# Patient Record
Sex: Male | Born: 1960 | Race: White | Hispanic: No | Marital: Married | Smoking: Former smoker
Health system: Southern US, Community
[De-identification: ages and names within clinical notes are randomized; demographics above are authoritative.]

## PROBLEM LIST (undated history)

## (undated) DIAGNOSIS — M1990 Unspecified osteoarthritis, unspecified site: Secondary | ICD-10-CM

## (undated) DIAGNOSIS — E119 Type 2 diabetes mellitus without complications: Secondary | ICD-10-CM

## (undated) DIAGNOSIS — F32A Depression, unspecified: Secondary | ICD-10-CM

## (undated) DIAGNOSIS — F329 Major depressive disorder, single episode, unspecified: Secondary | ICD-10-CM

## (undated) DIAGNOSIS — Z9889 Other specified postprocedural states: Secondary | ICD-10-CM

## (undated) DIAGNOSIS — R0600 Dyspnea, unspecified: Secondary | ICD-10-CM

## (undated) DIAGNOSIS — R112 Nausea with vomiting, unspecified: Secondary | ICD-10-CM

## (undated) DIAGNOSIS — R351 Nocturia: Secondary | ICD-10-CM

## (undated) DIAGNOSIS — I10 Essential (primary) hypertension: Secondary | ICD-10-CM

## (undated) HISTORY — PX: KNEE SURGERY: SHX244

## (undated) HISTORY — DX: Type 2 diabetes mellitus without complications: E11.9

## (undated) HISTORY — PX: WRIST SURGERY: SHX841

## (undated) HISTORY — PX: COLONOSCOPY: SHX174

---

## 1999-07-18 ENCOUNTER — Emergency Department (HOSPITAL_COMMUNITY): Admission: EM | Admit: 1999-07-18 | Discharge: 1999-07-18 | Payer: Self-pay | Admitting: Emergency Medicine

## 1999-07-18 ENCOUNTER — Encounter: Payer: Self-pay | Admitting: Emergency Medicine

## 2000-03-26 ENCOUNTER — Other Ambulatory Visit: Admission: RE | Admit: 2000-03-26 | Discharge: 2000-03-26 | Payer: Self-pay | Admitting: Urology

## 2000-03-26 ENCOUNTER — Encounter (INDEPENDENT_AMBULATORY_CARE_PROVIDER_SITE_OTHER): Payer: Self-pay | Admitting: Specialist

## 2001-12-02 ENCOUNTER — Encounter: Payer: Self-pay | Admitting: Orthopedic Surgery

## 2001-12-02 ENCOUNTER — Encounter: Admission: RE | Admit: 2001-12-02 | Discharge: 2001-12-02 | Payer: Self-pay | Admitting: Orthopedic Surgery

## 2001-12-03 ENCOUNTER — Ambulatory Visit (HOSPITAL_BASED_OUTPATIENT_CLINIC_OR_DEPARTMENT_OTHER): Admission: RE | Admit: 2001-12-03 | Discharge: 2001-12-04 | Payer: Self-pay | Admitting: Orthopedic Surgery

## 2003-03-10 ENCOUNTER — Ambulatory Visit (HOSPITAL_COMMUNITY): Admission: RE | Admit: 2003-03-10 | Discharge: 2003-03-10 | Payer: Self-pay | Admitting: Orthopedic Surgery

## 2003-03-10 ENCOUNTER — Ambulatory Visit (HOSPITAL_BASED_OUTPATIENT_CLINIC_OR_DEPARTMENT_OTHER): Admission: RE | Admit: 2003-03-10 | Discharge: 2003-03-10 | Payer: Self-pay | Admitting: Orthopedic Surgery

## 2003-08-28 ENCOUNTER — Inpatient Hospital Stay (HOSPITAL_COMMUNITY): Admission: AD | Admit: 2003-08-28 | Discharge: 2003-09-02 | Payer: Self-pay | Admitting: Psychiatry

## 2004-06-12 ENCOUNTER — Ambulatory Visit: Payer: Self-pay | Admitting: Psychiatry

## 2004-06-12 ENCOUNTER — Inpatient Hospital Stay (HOSPITAL_COMMUNITY): Admission: RE | Admit: 2004-06-12 | Discharge: 2004-06-16 | Payer: Self-pay | Admitting: Psychiatry

## 2004-06-12 ENCOUNTER — Ambulatory Visit: Payer: Self-pay | Admitting: Licensed Clinical Social Worker

## 2004-06-20 ENCOUNTER — Ambulatory Visit: Payer: Self-pay | Admitting: Licensed Clinical Social Worker

## 2004-06-27 ENCOUNTER — Ambulatory Visit: Payer: Self-pay | Admitting: Licensed Clinical Social Worker

## 2004-07-08 ENCOUNTER — Ambulatory Visit: Payer: Self-pay | Admitting: Licensed Clinical Social Worker

## 2004-07-16 ENCOUNTER — Ambulatory Visit: Payer: Self-pay | Admitting: Licensed Clinical Social Worker

## 2004-07-24 ENCOUNTER — Ambulatory Visit: Payer: Self-pay | Admitting: Licensed Clinical Social Worker

## 2004-08-06 ENCOUNTER — Ambulatory Visit: Payer: Self-pay | Admitting: Licensed Clinical Social Worker

## 2008-11-23 ENCOUNTER — Emergency Department (HOSPITAL_COMMUNITY): Admission: EM | Admit: 2008-11-23 | Discharge: 2008-11-23 | Payer: Self-pay | Admitting: Emergency Medicine

## 2010-09-06 NOTE — Discharge Summary (Signed)
NAMEBRUK, Jonathan Riley NO.:  [id number]   MEDICAL RECORD NO.:  [id number]          PATIENT TYPE:  IPS   LOCATION:  0506                          FACILITY:  [facility]   PHYSICIAN:  Jeanice Lim, M.D. DATE OF BIRTH:  1960-05-23   DATE OF ADMISSION:  06/12/2004  DATE OF DISCHARGE:  06/16/2004                                 DISCHARGE SUMMARY   IDENTIFYING DATA:  This is a 50 year old married Caucasian male voluntarily  admitted presenting with a history of depression  and suicide attempt,  angers easily, threatening wife.  Wife took kids and left.  Patient had felt  suicidal, was going to kill himself with carbon monoxide.  He called wife  and wanted to hear her voice one last time.  The 50-year-old did get on the  phone, and patient decided not to continue.  He realized how angry he gets  and wanted help.   PAST PSYCHIATRIC HISTORY:  This is second [facility]  admission.  He was here 1 year ago for an overdose.  He sees Dr. Marlyne Beards as  an outpatient.   MEDICATIONS:  Wellbutrin XL.  Lithium.  Lipitor.  Xanax.   DRUG ALLERGIES:  No known drug allergies.   PHYSICAL EXAMINATION:  His physical and neurologic exam within normal  limits.   ROUTINE ADMISSION LABS:  Within normal limits.   MENTAL STATUS EXAM:  Alert, middle-aged male, cooperative, clear.  Speech  within normal limits.  Mood guilty.  Thought processes goal-directed.  Cognitively intact. Judgment and insight were impaired with poor impulse  control.  He denied acute suicidal ideation and admitted to problems with  reactivity and anger management and possible mood swings.   ADMISSION DIAGNOSES:   AXIS I:  Bipolar disorder, not otherwise specified.   AXIS II:  Deferred.   AXIS III:  None.   AXIS IV:  Moderate stressors with limited support system.   AXIS V:  35/60.   HOSPITAL COURSE:  Patient was admitted, ordered routine p.r.n. medicine,  underwent further monitoring, was  encouraged to participate in individual,  group and milieu therapy.  He was monitored for safety with every 15 minute  checks following suicide attempt and was stabilized on medications.  Patient  was started on lithium after risk/benefits ratio and alternative treatments  were discussed.  Patient described clear history of mood instability.  Patient felt somewhat groggy and Equetro.  This was decreased, and patient  was discharged in improved condition.  Patient was discharged with no  dangerous ideation.  Mood was more stable.  Affect brighter, more helpful.  Patient had improved judgment, insight and coping skills and appeared to be  motivated to be compliant with the aftercare planning.  He was given  medication education again regarding issues with lithium, importance of  monitoring, dangers of overdose.   DISCHARGE MEDICATIONS:  After medication education he was discharged on:  1.  Wellbutrin XL 150 mg q.a.m.  2.  Seroquel 25 mg 2 at 8:00 p.m.  3.  Equetro 100 mg 1 at 9:00 a.m. and 3 at 8:00 p.m.  4.  Xanax 0.25 mg 1 at 7:00 a.m., one at 6:00 p.m. and 2 at 8:00 p.m.  5.  Lithium 450 mg 1 at 9:00 a.m. and 1-1/2 at 6:00 p.m.   FOLLOW UP:  Patient is to follow up with Ladona Horns at [facility] for anger management, and Dr. Beverly Milch and Judithe Modest with  appointments already scheduled.  Patient was discharged in improved  condition.   DISCHARGE DIAGNOSES:   AXIS I:  Bipolar disorder, not otherwise specified.   AXIS II:  Deferred.   AXIS III:  None.   AXIS IV:  Moderate stressors with limited support system.   AXIS V:  Global assessment of function on discharge 55-60.      JEM/MEDQ  D:  07/21/2004  T:  07/21/2004  Job:  [id number]

## 2010-09-06 NOTE — Op Note (Signed)
NAME:  Jonathan Riley, Jonathan Riley                         ACCOUNT NO.:  [id number]   MEDICAL RECORD NO.:  [id number]                   PATIENT TYPE:  AMB   LOCATION:  DSC                                  FACILITY:  [facility]   PHYSICIAN:  Harvie Junior, M.D.                DATE OF BIRTH:  [birth year]   DATE OF PROCEDURE:  12/03/2001  DATE OF DISCHARGE:  12/04/2001                                 OPERATIVE REPORT   PREOPERATIVE DIAGNOSES:  Anterior cruciate ligament tear with medial  meniscal tear.   POSTOPERATIVE DIAGNOSES:  Anterior cruciate ligament tear with medial  meniscal tear with a bucket handle type medial meniscal tear.   OPERATION PERFORMED:  Left knee arthroscopy with anterior cruciate ligament  reconstruction and partial medial meniscectomy.   SURGEON:  Harvie Junior, M.D.   ASSISTANT:  Currie Paris. Thedore Mins.   ANESTHESIA:  General.   INDICATIONS FOR PROCEDURE:  Jonathan Riley is a 50 year old male with a long  history of having had his knee giving out on multiple occasions.  He  ultimately was evaluated and felt to have an anterior cruciate ligament  tear.  MRI was obtained which showed that he had a medial meniscal tear as  well as anterior cruciate ligament tear.  We talked about treatment options  and ultimately felt that he wanted to get both fixed.  He was brought to the  operating room for this procedure.   DESCRIPTION OF PROCEDURE:  The patient was brought to the operating room  where adequate anesthesia was obtained with general anesthetic.  The patient  was placed supine on the operating table.  The leg was prepped and draped in  the usual sterile fashion.  Following this, routine arthroscopic examination  of the knee revealed that there was an obvious large bucket handle medial  meniscal tear that was not repairable.  This was debrided from the front and  back and the meniscal fragment was removed.  The remaining meniscal rim was  contoured down with a suction  shaver.  The medial femoral condyle had some  grade 1 change but not significant.  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  Attention was  turned to the lateral compartment which was without significant abnormality.  The patellofemoral compartment was normal.  At this time attention was  turned toward the notch where a notch plasty was performed with a  combination of motorized bur and suction shaver.  Once an adequate  notchplasty was obtained, the leg was exsanguinated.  Blood pressure  tourniquet was inflated to 350 mmHg.  Following this, a small incision was  made on the medial aspect of the leg and the instruments for ACL  reconstruction were then brought in.  The guide was set at 60 degrees and  the tibial guide was used and a guidewire was put  into the central portion  of the knee 7mm anterior to the posterior cruciate ligament insertion.  Following this, this was overreamed with a 10 mm reamer.  The over-the-top  guide was then used and a pilot hole of 20 mm was made on the femoral side 6  mm from the back and 9 mm bone plug proximally.  Following this, a pilot  hole was made and the drill was brought back to make sure that there was a  good back wall.  This was drilled to a level of 25 and following this, all  bony fragments were removed from the knee.  The graft was then pulled to the  knee.  The graft had been fashioned on the back table by Jonathan Riley  graftologist to a 9 mm bone plug proximally, 10 distally with a 13 mm  collagen thickness centrally.  At this point the graft was pulled and the  knee locked proximally and distally with titanium interference fit screws.  Excellent  fixation was achieved at this point.  The knee was then copiously  irrigated and suctioned dry.  The leg was checked with end point Lachman  negative, pivot shift negative, both positive prior to the surgery.  At this  point the wounds were  closed in layers, sterile compressive dressing was  applied.  The patient was placed into a compressive dressing as well as knee  immobilizer and taken to the recovery room where he was noted to be in  satisfactory condition.  The estimated blood loss for this procedure was  none.  Harvie Junior, M.D.    Ranae Plumber  D:  01/20/2002  T:  01/20/2002  Job:  [id number]

## 2010-09-06 NOTE — Discharge Summary (Signed)
NAME:  Jonathan Riley, Jonathan Riley                         ACCOUNT NO.:  [id number]   MEDICAL RECORD NO.:  [id number]                   PATIENT TYPE:  IPS   LOCATION:  0507                                 [facility]ILITY[facility]   PHYSICIAN:  Syed T. Arfeen, M.D.                DATE OF BIRTH:  March 08, 1961   DATE OF ADMISSION:  08/28/2003  DATE OF DISCHARGE:  09/02/2003                                 DISCHARGE SUMMARY   IDENTIFYING INFORMATION:  The patient is a 50 year old white married man who  came for an involuntary admission.  The patient was referred and then  presented in the ER after taking an overdose of 25 mg of Seroquel 25  tablets.  The patient stated that he was having nightmares and obsessive  sexual thoughts and he was feeling guilty for his thoughts and afraid that  he was going act on those sexual thoughts.  He appeared fearful that the  marriage will be ended if he acted on those sexual thoughts.  He felt  helpless and felt rejected by his wife.  The patient was experiencing those  episodes for three to four weeks.  For more details, please see admission  note.   PAST PSYCHIATRIC HISTORY:  This was the patient's first psychiatric  hospitalization at [facility].  However, he had a history of  prior suicidal threats.  He was diagnosed sex addict and bipolar  disorder.  For details, please see admission note.   SUBSTANCE ABUSE HISTORY:  The patient denied.   PAST MEDICAL HISTORY:  Medical problems: The patient denied; however, he had  a history of right wrist surgery.   MEDICATIONS:  1. Cymbalta 60 mg.  2. Xanax 1 mg frequency not known.  3. Lithium 450 mg, half in the a.m. and half h.s.  4. Wellbutrin dose known.   ALLERGIES:  None.   PHYSICAL EXAMINATION:  GENERAL:  Positive physical findings was done in the  ER.  No significant findings.   MENTAL STATUS EXAM:  Fairly alert with labile affect.  Mood: Labile.  Speech: Normal.  No flight of ideas, no looseness of  associations.  Thought  processes: Logical, having vague suicidal thoughts, a lot of guilt and  hopelessness.  Cognitive function: Intact.  Insight and judgment were fair.   ADMISSION DIAGNOSES:   AXIS I:  Bipolar disorder, rule out hypomania.   AXIS II:  Deferred.   AXIS III:  None.   AXIS IV:  Severe marital stress.   AXIS V:  30   HOSPITAL COURSE:  The patient was admitted and started on standing and  p.r.n. medications.  He was placed on safety checks and underwent further  monitoring.  He was started on Risperdal 0.25 mg and lithium 300 mg p.o.  b.i.d. and Wellbutrin 300 mg in the a.m.  Doses were titrated according to  therapeutic response.  He was also started on Xanax  1 mg p.o. t.i.d. and  Cymbalta was held to see the therapeutic response.  The patient's lithium  level was ordered, which came back at 0.39.  Lithium was increased according  to the therapeutic response to 300 mg t.i.d.  Risperdal was also gradually  increased to 0.25 mg b.i.d. and 0.5 mg at h.s.  Besides the above, he was  also started on 25 mg of Luvox every day.  The patient started to show some  improvement with more stable mood.  A family session was scheduled with the  wife, which went very well.  The patient was able to talk about his issues  and was easily redirectable and able to focus on session goals.  The patient  and wife reported that they had no concern for discharge and the patient  stated that he could handle the stress.  Wife reported that the patient was  baseline; however, requested a referral plan after discharge.  He reported  no sexual intrusive thought and felt more stable.  Lithium level was again  drawn on May 14 which came back 0.39.  The patient continued to show more  improvement.  He reported no intrusive sexual thoughts or suicidal thoughts  and stated that he did improve a lot.  His mood was more stable with  increased coping skills.  He was also encouraged to participate in   individual, group, and milieu therapy.  With the above recommendations,  overall condition was much improved.   CONDITION ON DISCHARGE:  Condition on discharge was markedly improved.  Mood: Euthymic.  Affect: Bright.  Thought process: Goal directed.  Thought  content: Negative for dangerous ideation or psychotic symptoms.  He denied  any intrusive sexual thoughts and seemed motivated for compliance with  aftercare plan and followup therapy.   DISCHARGE MEDICATIONS:  1. Wellbutrin 300 mg by mouth every morning.  2. Lithium carbonate 300 mg three times a day; level 0.39 on May 14.  3. Risperdal 0.25 mg twice a day and 0.5 mg at bedtime.  4. Luvox 25 mg every day.   DISCHARGE DIAGNOSES:   AXIS I:  Bipolar disorder.   AXIS II:  Deferred.   AXIS III:  None.   AXIS IV:  Severe with marital stress.   AXIS V:  80   FOLLOW UP:  The patient was recommended to see Dr. Omelia Blackwater on May 26 at 2  p.m. at [facility].  The patient  was also given a followup appointment with Glendell Docker on Thursday, May  19 at 2:45 p.m.  The patient agreed with the discharge instructions.                                              Syed T. Lolly Mustache, M.D.   STA/MEDQ  D:  10/21/2003  T:  10/22/2003  Job:  [id number]

## 2010-09-06 NOTE — Op Note (Signed)
NAME:  Jonathan Riley, Jonathan Riley                         ACCOUNT NO.:  [id number]   MEDICAL RECORD NO.:  [id number]                   PATIENT TYPE:  AMB   LOCATION:  DSC                                  FACILITY:  [facility]   PHYSICIAN:  Harvie Junior, M.D.                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   PROCEDURE:  1. Proximal row corpectomy.  2. Radial styloidectomy.  3. Posterior interosseous nerve neurectomy.   SURGEON:  Harvie Junior, M.D.   ASSISTANT:  Marshia Ly, P.A.   ANESTHESIA:  General.   INDICATIONS FOR PROCEDURE:  The patient is a 50 year old male with a long  history of having significant right wrist pain.  He had tolerated this for  many years and because of inability to continue performing at his job, he  ultimately was evaluated.  His x-rays at that time showed that he had  significant scapholunate advanced collapse with a large scaphoid osteophyte  and spurring on the radial styloid.  We attempted conservative treatment  with anti-inflammatory medication, immobilization, injection therapy. All of  this worked temporarily, but he continued to have pain on a daily basis with  light activity and also sleep.  Because of continued complaints of pain and  inability to pursue activity further, we talked about treatment options and  ultimately felt that proximal row corpectomy was the best treatment.  I had  reviewed his x-rays with Molli Hazard A. Mina Marble, M.D. who agreed a proximal row  corpectomy was his choice in this patient provided that the capitate looked  good and he was brought to the operating room with the idea that we  would do  a proximal row corpectomy if the capitate looked good.  At this point, the  patient was brought to the operating room for this procedure.   DESCRIPTION OF PROCEDURE:  The patient was brought to the operating room and  after adequate anesthesia was obtained with general endotracheal, the  patient was placed on the operating table.  The right arm was then prepped  and draped in the usual sterile fashion.  Following this, attention was  turned toward the back part of the wrist where a linear incision was made  over the fourth extensor compartment.  A third extensor compartment was then  identified and opened and the entire fourth extensor compartment as well as  the remainder of this area was debrided off of the periosteal area of the  distal radius.  The second and first extensor compartments were debrided off  the distal radius radially.  The proximal row was then excised in pieces,  starting with the lunate, doing a triquetrum, and then finally the scaphoid.  At this point, the capitate came nicely into the lunate fossa, actually  seemed to fit very well. The capitate looked without evidence of significant  abnormality.  Attention was then turned toward x-ray imaging showing that  the proximal row had been resected and assessing the radial styloid area for  any abutment.  It did appear at this point that the radial styloid abutted  with the trapezium and at this point a radial styloidectomy of 9 mm of bone  was taken from the radial styloid area with the saw under fluoroscopic  guidance.  Attention was turned toward the ulnar side of the wrist where the  posterior interosseous nerve was identified and a posterior interosseous  nerve neurectomy was undertaken at this point to try to denervate the wrist  from any wrist capsular pain.  At this point, the dorsal retinaculum was  closed laying the extensor compartments back on the wrist.  Lister's  tubercle was debrided and the EPL  was allowed to fall into its previous  course with suturing underneath the EPL to not have it adhered to bone.  At  this point, the wound was copiously irrigated and suctioned dry.  The  closure of the capsule and this layer was done with interrupted 3-0 Ethibond  sutures.  At this point, the skin was closed with 3-0 Vicryl and a 4-0 nylon  running suture.  A sterile compressive dressing was applied as well as  plaster and the patient was taken to the recovery room where she was noted  to be in satisfactory condition.  Estimated blood loss for the procedure was  none.  Of note, fluoroscopy was used throughout the case on the radial  styloidectomy.                                               Harvie Junior, M.D.    Ranae Plumber  D:  03/10/2003  T:  03/11/2003  Job:  [id number]

## 2013-02-21 ENCOUNTER — Encounter: Payer: Self-pay | Admitting: Diagnostic Neuroimaging

## 2013-02-21 ENCOUNTER — Ambulatory Visit (INDEPENDENT_AMBULATORY_CARE_PROVIDER_SITE_OTHER): Payer: BC Managed Care – PPO | Admitting: Diagnostic Neuroimaging

## 2013-02-21 ENCOUNTER — Encounter (INDEPENDENT_AMBULATORY_CARE_PROVIDER_SITE_OTHER): Payer: Self-pay

## 2013-02-21 VITALS — BP 164/105 | HR 73 | Ht 70.0 in | Wt 245.5 lb

## 2013-02-21 DIAGNOSIS — R404 Transient alteration of awareness: Secondary | ICD-10-CM

## 2013-02-21 NOTE — Progress Notes (Addendum)
[facility]  PATIENT: Jonathan Riley DOB: Mar 12, 1961  REFERRING CLINICIAN: Ehinger HISTORY FROM: patient REASON FOR VISIT: new consult   HISTORICAL  CHIEF COMPLAINT:  Chief Complaint  Patient presents with  . Memory Loss    black outs    HISTORY OF PRESENT ILLNESS:   52 year old right-handed male here for evaluation of blackout spells, possible seizures.  June 2014 patient had first episode of staring spell lasting one to 2 seconds. Patient felt very hungry immediately prior to the event. Apparently he was witnessed to have staring spell first a few seconds and then resolve. Patient was sitting on the time but did not fall down. There may have been some jittery shaking movements of his arms. No tongue biting. No postictal confusion.  Patient has had 3 or 4 total events over the past few months. Most of these events have been preceded by severe hunger sensation. He may have skipped meals with some of these events. Last event occurred to 4 weeks ago. One event occurred while he was at a restaurant standing in line, had an event where he leaned against the wall, staring for a few seconds and then return to consciousness without falling down. Patient had some nausea prior to these events.   Prior head trauma include concussion at age 77 years old. No meningitis or encephalitis. No family history of seizure or syncope. No chest pain or shortness of breath. No swelling, palpitation, chest pain shortness of breath.  REVIEW OF SYSTEMS: Full 14 system review of systems performed and notable only for fatigue joint pain aching muscles decreased energy dizziness snoring.  ALLERGIES: Allergies  Allergen Reactions  . Percocet [Oxycodone-Acetaminophen] Nausea And Vomiting    HOME MEDICATIONS: No outpatient prescriptions prior to visit.   No facility-administered medications prior to visit.    PAST MEDICAL HISTORY: History reviewed. No pertinent past medical  history.  PAST SURGICAL HISTORY: Past Surgical History  Procedure Laterality Date  . Knee surgery Bilateral     Right, Left x2  . Wrist surgery Right 2008 or 2009    FAMILY HISTORY: Family History  Problem Relation Age of Onset  . Heart disease    . Diabetes    . Cancer      SOCIAL HISTORY:  History   Social History  . Marital Status: Married    Spouse Name: Marcelino Duster    Number of Children: 3  . Years of Education: College   Occupational History  .  At [employer]   Social History Main Topics  . Smoking status: Former Smoker    Types: Cigarettes  . Smokeless tobacco: Never Used  . Alcohol Use: No  . Drug Use: No  . Sexual Activity: Not on file   Other Topics Concern  . Not on file   Social History Narrative   Patient lives at home with his family.   Caffeine Use: 2 cups of coffee and 2 sodas daily     PHYSICAL EXAM  Filed Vitals:   02/21/13 1020 02/21/13 1021 02/21/13 1111  BP: 172/94 166/100 164/105  Pulse: 66 83 73  Height:  5\' 10"  (1.778 m)   Weight:  245 lb 8 oz (111.358 kg)     Not recorded    Body mass index is 35.23 kg/(m^2).  GENERAL EXAM: Patient is in no distress  CARDIOVASCULAR: Regular rate and rhythm, no murmurs, no carotid bruits  NEUROLOGIC: MENTAL STATUS: awake, alert, language fluent, comprehension intact, naming intact; MMSE 30/30. MOCA 30/30. AFT  15. GDS 1. CRANIAL NERVE: no papilledema on fundoscopic exam, pupils equal and reactive to light, visual fields full to confrontation, extraocular muscles intact, no nystagmus, facial sensation and strength symmetric, uvula midline, shoulder shrug symmetric, tongue midline. MOTOR: normal bulk and tone, full strength in the BUE, BLE SENSORY: normal and symmetric to light touch, pinprick, temperature, vibration COORDINATION: finger-nose-finger, fine finger movements normal REFLEXES: deep tendon reflexes present and symmetric GAIT/STATION: narrow based gait; able to walk on toes, heels and  tandem; romberg is negative   DIAGNOSTIC DATA (LABS, IMAGING, TESTING) - I reviewed patient records, labs, notes, testing and imaging myself where available.  No results found for this basename: WBC, HGB, HCT, MCV, PLT   No results found for this basename: na, k, cl, co2, glucose, bun, creatinine, calcium, prot, albumin, ast, alt, alkphos, bilitot, gfrnonaa, gfraa   No results found for this basename: CHOL, HDL, LDLCALC, LDLDIRECT, TRIG, CHOLHDL   No results found for this basename: HGBA1C   No results found for this basename: VITAMINB12   No results found for this basename: TSH      ASSESSMENT AND PLAN  52 y.o. year old male here with 3-4 events of brief alteration of consciousness, staring spell, lasting a few seconds at a time. Most of these events have and proceeded by severe hunger sensation. May represent hypoglycemic events. Other possibility would include complex partial seizures or petit mal seizures.  DDX: presyncope, hypoglycemia, seizure, metabolic  PLAN: - MRI brain - EEG - caution with driving (has some warning prior to events) - eat regularly   Orders Placed This Encounter  Procedures  . MR Brain W Wo Contrast  . EEG adult    Return in about 6 weeks (around 04/04/2013).  I reviewed labs, notes, records myself. I summarized findings and reviewed with patient, for this high risk condition (possible seizure vs hypoglycemia vs presyncope) requiring high complexity decision making.    Suanne Marker, MD 02/21/2013, 11:20 AM Certified in Neurology, Neurophysiology and Neuroimaging  [facility] [address] [place], [place] [address] [phone]

## 2013-03-09 ENCOUNTER — Ambulatory Visit (INDEPENDENT_AMBULATORY_CARE_PROVIDER_SITE_OTHER): Payer: BC Managed Care – PPO | Admitting: Radiology

## 2013-03-09 DIAGNOSIS — R404 Transient alteration of awareness: Secondary | ICD-10-CM

## 2013-03-10 NOTE — Procedures (Signed)
HISTORY: 52 years old male, with 3-4 episodes of brief alteration of consciousness, staring spells, lasting a few seconds, preceded by severe hungry sensation,  TECHNIQUE:  16 channel EEG was performed based on standard 10-16 international system. One channel was dedicated to EKG, which has demonstrates normal sinus rhythm of 66 beats per minutes.  Upon awakening, the posterior background activity was well-developed, in alpha range, 11-12 Hz, with amplitude of 35 microvoltage, reactive to eye opening and closure.  There was no evidence of epilepsy for discharge.  Photic stimulation was performed, which induced a symmetric photic driving.  Hyperventilation was performed, there was no abnormality elicit.  No sleep was achieved.  CONCLUSION: This is a  normal awake EEG.  There is no electrodiagnostic evidence of epileptiform discharge

## 2013-03-23 ENCOUNTER — Ambulatory Visit (INDEPENDENT_AMBULATORY_CARE_PROVIDER_SITE_OTHER): Payer: BC Managed Care – PPO

## 2013-03-23 DIAGNOSIS — R404 Transient alteration of awareness: Secondary | ICD-10-CM

## 2013-03-23 MED ORDER — GADOPENTETATE DIMEGLUMINE 469.01 MG/ML IV SOLN: 20.0000 mL | Freq: Once | INTRAVENOUS | Status: AC | PRN

## 2013-04-06 ENCOUNTER — Encounter: Payer: Self-pay | Admitting: Diagnostic Neuroimaging

## 2013-04-06 ENCOUNTER — Encounter (INDEPENDENT_AMBULATORY_CARE_PROVIDER_SITE_OTHER): Payer: Self-pay

## 2013-04-06 ENCOUNTER — Ambulatory Visit (INDEPENDENT_AMBULATORY_CARE_PROVIDER_SITE_OTHER): Payer: BC Managed Care – PPO | Admitting: Diagnostic Neuroimaging

## 2013-04-06 VITALS — BP 155/94 | HR 83 | Temp 97.3°F | Ht 71.0 in | Wt 245.0 lb

## 2013-04-06 DIAGNOSIS — R404 Transient alteration of awareness: Secondary | ICD-10-CM

## 2013-04-06 NOTE — Progress Notes (Signed)
[facility]  PATIENT: Jonathan Riley DOB: Mar 12, 1961  REFERRING CLINICIAN: Ehinger HISTORY FROM: patient REASON FOR VISIT: follow up   HISTORICAL  CHIEF COMPLAINT:  Chief Complaint  Patient presents with  . Follow-up    Alteration Of Consciousness #6    HISTORY OF PRESENT ILLNESS:   UPDATE 04/06/13: Since last visit patient doing well. No further events. Patient has been focusing on his nutrition and avoiding periods of severe hunger. He carries less of them to work as well as snacks when he is hungry. Patient sleeps 5-6 hours per night. He does have snoring. No witnessed apnea. Mild daytime sleepiness around lunchtime.  PRIOR HPI (02/21/13): 52 year old right-handed male here for evaluation of blackout spells, possible seizures.  June 2014 patient had first episode of staring spell lasting one to 2 seconds. Patient felt very hungry immediately prior to the event. Apparently he was witnessed to have staring spell first a few seconds and then resolve. Patient was sitting on the time but did not fall down. There may have been some jittery shaking movements of his arms. No tongue biting. No postictal confusion.  Patient has had 3 or 4 total events over the past few months. Most of these events have been preceded by severe hunger sensation. He may have skipped meals with some of these events. Last event occurred to 4 weeks ago. One event occurred while he was at a restaurant standing in line, had an event where he leaned against the wall, staring for a few seconds and then return to consciousness without falling down. Patient had some nausea prior to these events.   Prior head trauma include concussion at age 38 years old. No meningitis or encephalitis. No family history of seizure or syncope. No chest pain or shortness of breath. No swelling, palpitation, chest pain shortness of breath.  REVIEW OF SYSTEMS: Full 14 system review of systems performed and notable  only for fatigue joint pain aching muscles decreased energy snoring.  ALLERGIES: Allergies  Allergen Reactions  . Percocet [Oxycodone-Acetaminophen] Nausea And Vomiting    HOME MEDICATIONS: Outpatient Prescriptions Prior to Visit  Medication Sig Dispense Refill  . aspirin 81 MG tablet Take 81 mg by mouth daily. 2 tabs qd      . CIALIS 20 MG tablet Take 1 tablet by mouth daily.      . Diphenhydramine-PSE-APAP 25-30-500 MG TABS Take 1 tablet by mouth as needed.      . Ibuprofen-Diphenhydramine HCl (ADVIL PM) 200-25 MG CAPS Take 1 capsule by mouth as needed.       No facility-administered medications prior to visit.    PAST MEDICAL HISTORY: No past medical history on file.  PAST SURGICAL HISTORY: Past Surgical History  Procedure Laterality Date  . Knee surgery Bilateral     Right, Left x2  . Wrist surgery Right 2008 or 2009    FAMILY HISTORY: Family History  Problem Relation Age of Onset  . Heart disease    . Diabetes    . Cancer      SOCIAL HISTORY:  History   Social History  . Marital Status: Married    Spouse Name: Marcelino Duster    Number of Children: 3  . Years of Education: College   Occupational History  .  At [employer]   Social History Main Topics  . Smoking status: Former Smoker    Types: Cigarettes  . Smokeless tobacco: Never Used  . Alcohol Use: No  . Drug Use: No  .  Sexual Activity: Yes   Other Topics Concern  . Not on file   Social History Narrative   Patient lives at home with his family.   Caffeine Use: 2 cups of coffee and 2 sodas daily     PHYSICAL EXAM  Filed Vitals:   04/06/13 1141  BP: 155/94  Pulse: 83  Temp: 97.3 F (36.3 C)  TempSrc: Oral  Height: 5\' 11"  (1.803 m)  Weight: 245 lb (111.131 kg)    Not recorded    Body mass index is 34.19 kg/(m^2).  GENERAL EXAM: Patient is in no distress  CARDIOVASCULAR: Regular rate and rhythm, no murmurs, no carotid bruits  NEUROLOGIC: MENTAL STATUS: awake, alert, language fluent,  comprehension intact, naming intact; MOCA 29/30. CRANIAL NERVE: pupils equal and reactive to light, visual fields full to confrontation, extraocular muscles intact, no nystagmus, facial sensation and strength symmetric, uvula midline, shoulder shrug symmetric, tongue midline. MOTOR: normal bulk and tone, full strength in the BUE, BLE SENSORY: normal and symmetric to light touch COORDINATION: normal REFLEXES: deep tendon reflexes present and symmetric GAIT/STATION: narrow based gait  DIAGNOSTIC DATA (LABS, IMAGING, TESTING) - I reviewed patient records, labs, notes, testing and imaging myself where available.  No results found for this basename: WBC,  HGB,  HCT,  MCV,  PLT   No results found for this basename: na,  k,  cl,  co2,  glucose,  bun,  creatinine,  calcium,  prot,  albumin,  ast,  alt,  alkphos,  bilitot,  gfrnonaa,  gfraa   No results found for this basename: CHOL,  HDL,  LDLCALC,  LDLDIRECT,  TRIG,  CHOLHDL   No results found for this basename: HGBA1C   No results found for this basename: VITAMINB12   No results found for this basename: TSH   I reviewed images myself and agree with interpretation. -VRP  03/23/13 MRI BRAIN - few scattered periventricular and subcortical foci of non-specific gliosis. No abnormal lesions are seen on post contrast views.   03/10/13 EEG - normal   ASSESSMENT AND PLAN  52 y.o. year old male here with 3-4 events of brief alteration of consciousness, staring spell, lasting a few seconds at a time. Most of these events have and proceeded by severe hunger sensation. May have represented hypoglycemic events. MRI, EEG, exam unremarkable.  Ddx: hypoglycemia, metabolic, presyncope  PLAN: - caution with driving (has some warning prior to events) - eat regularly  Return if symptoms worsen or fail to improve.   Suanne Marker, MD 04/06/2013, 12:11 PM Certified in Neurology, Neurophysiology and Neuroimaging  [facility] [address] [place], [place] [address] [phone]

## 2013-04-06 NOTE — Patient Instructions (Signed)
Follow up as needed.  Focus on good nutrition, and frequent small meals/snacks.

## 2013-08-04 ENCOUNTER — Encounter: Payer: Self-pay | Admitting: *Deleted

## 2013-08-06 ENCOUNTER — Encounter: Payer: Self-pay | Admitting: *Deleted

## 2016-01-17 ENCOUNTER — Other Ambulatory Visit: Payer: Self-pay | Admitting: Family Medicine

## 2016-01-17 ENCOUNTER — Ambulatory Visit
Admission: RE | Admit: 2016-01-17 | Discharge: 2016-01-17 | Disposition: A | Discharge status: Home / Self Care | Payer: BLUE CROSS/BLUE SHIELD | Source: Ambulatory Visit | Attending: Family Medicine | Admitting: Family Medicine

## 2016-01-17 DIAGNOSIS — Z0000 Encounter for general adult medical examination without abnormal findings: Secondary | ICD-10-CM

## 2016-02-17 ENCOUNTER — Ambulatory Visit: Payer: Self-pay | Admitting: Orthopedic Surgery

## 2016-02-25 NOTE — Pre-Procedure Instructions (Addendum)
Jonathan Riley  02/25/2016      [id number] [place] - [address] [place] [address] Phone: [phone] Fax: [phone]  [id number] - [place], [place] - [address] [place] [address] Phone: [phone] Fax: [phone]    Your procedure is scheduled on Monday, November 13th, 2017  [facility] at 1:15 [facility].M.   Call this number if you have problems the morning of surgery:  [phone]   Remember:  Do not eat food or drink liquids after midnight.   Take these medicines the morning of surgery with A SIP OF WATER amlodipine(norvasc)  STOP all herbel meds, nsaids (aleve,naproxen,advil,ibuprofen) Starting today including all vitamins, aspirin   Do not wear jewelry.  Do not wear lotions, powders, or colognes, or deoderant.  Men may shave face and neck.  Do not bring valuables to the hospital.   [facility] is not responsible for any belongings or valuables.  Contacts, dentures or bridgework may not be worn into surgery.  Leave your suitcase in the car.  After surgery it may be brought to your room.  For patients admitted to the hospital, discharge time will be determined by your treatment team.  Patients discharged the day of surgery will not be allowed to drive home.   : Special Instructions: [place] - Preparing for Surgery  Before surgery, you can play an important role.  Because skin is not sterile, your skin needs to be as free of germs as possible.  You can reduce the number of germs on you skin by washing with CHG (chlorahexidine gluconate) soap before surgery.  CHG is an antiseptic cleaner which kills germs and bonds with the skin to continue killing germs even after washing.  Please DO NOT use if you have an allergy to CHG or antibacterial soaps.  If your skin becomes reddened/irritated stop using the CHG and inform your nurse when you arrive at Short  Stay.  Do not shave (including legs and underarms) for at least 48 hours prior to the first CHG shower.  You may shave your face.  Please follow these instructions carefully:   1.  Shower with CHG Soap the night before surgery and the morning of Surgery.  2.  If you choose to wash your hair, wash your hair first as usual with your normal shampoo.  3.  After you shampoo, rinse your hair and body thoroughly to remove the Shampoo.  4.  Use CHG as you would any other liquid soap.  You can apply chg directly  to the skin and wash gently with scrungie or a clean washcloth.  5.  Apply the CHG Soap to your body ONLY FROM THE NECK DOWN.  Do not use on open wounds or open sores.  Avoid contact with your eyes ears, mouth and genitals (private parts).  Wash genitals (private parts)       with your normal soap.  6.  Wash thoroughly, paying special attention to the area where your surgery will be performed.  7.  Thoroughly rinse your body with warm water from the neck down.  8.  DO NOT shower/wash with your normal soap after using and rinsing off the CHG Soap.  9.  Pat yourself dry with a clean towel.            10.  Wear clean pajamas.  11.  Place clean sheets on your bed the night of your first shower and do not sleep with pets.  Day of Surgery  Do not apply any lotions/deodorants the morning of surgery.  Please wear clean clothes to the hospital/surgery center.  Please read over the  fact sheets that you were given.

## 2016-02-26 ENCOUNTER — Encounter (HOSPITAL_COMMUNITY)
Admission: RE | Admit: 2016-02-26 | Discharge: 2016-02-26 | Disposition: A | Discharge status: Home / Self Care | Payer: BLUE CROSS/BLUE SHIELD | Source: Ambulatory Visit | Attending: Orthopedic Surgery | Admitting: Orthopedic Surgery

## 2016-02-26 ENCOUNTER — Encounter (HOSPITAL_COMMUNITY): Payer: Self-pay

## 2016-02-26 DIAGNOSIS — Z01818 Encounter for other preprocedural examination: Secondary | ICD-10-CM | POA: Insufficient documentation

## 2016-02-26 HISTORY — DX: Major depressive disorder, single episode, unspecified: F32.9

## 2016-02-26 HISTORY — DX: Unspecified osteoarthritis, unspecified site: M19.90

## 2016-02-26 HISTORY — DX: Depression, unspecified: F32.A

## 2016-02-26 HISTORY — DX: Other specified postprocedural states: R11.2

## 2016-02-26 HISTORY — DX: Nocturia: R35.1

## 2016-02-26 HISTORY — DX: Essential (primary) hypertension: I10

## 2016-02-26 HISTORY — DX: Dyspnea, unspecified: R06.00

## 2016-02-26 HISTORY — DX: Other specified postprocedural states: Z98.890

## 2016-02-26 LAB — CBC
HCT: 44.9 % (ref 39.0–52.0)
HEMOGLOBIN: 15.6 g/dL (ref 13.0–17.0)
MCH: 27.3 pg (ref 26.0–34.0)
MCHC: 34.7 g/dL (ref 30.0–36.0)
MCV: 78.5 fL (ref 78.0–100.0)
PLATELETS: 174 10*3/uL (ref 150–400)
RBC: 5.72 MIL/uL (ref 4.22–5.81)
RDW: 13.2 % (ref 11.5–15.5)
WBC: 9.1 10*3/uL (ref 4.0–10.5)

## 2016-02-26 LAB — BASIC METABOLIC PANEL
Anion gap: 9 (ref 5–15)
BUN: 10 mg/dL (ref 6–20)
CALCIUM: 9.8 mg/dL (ref 8.9–10.3)
CO2: 25 mmol/L (ref 22–32)
CREATININE: 0.86 mg/dL (ref 0.61–1.24)
Chloride: 104 mmol/L (ref 101–111)
Glucose, Bld: 123 mg/dL — ABNORMAL HIGH (ref 65–99)
Potassium: 3.9 mmol/L (ref 3.5–5.1)
SODIUM: 138 mmol/L (ref 135–145)

## 2016-02-26 LAB — TYPE AND SCREEN
ABO/RH(D): A POS
ANTIBODY SCREEN: NEGATIVE

## 2016-02-26 LAB — ABO/RH: ABO/RH(D): A POS

## 2016-02-26 LAB — SURGICAL PCR SCREEN
MRSA, PCR: NEGATIVE
STAPHYLOCOCCUS AUREUS: NEGATIVE

## 2016-02-26 NOTE — Progress Notes (Signed)
PCP - Dr. Blair Heysobert Ehinger Cardiologist - denies  EKG - 01/17/16 - in chart CXR - 01/17/16 - in chart Echo/stress test/cardiac cath - denies  Patient denies chest pain and shortness of breath at PAT appointment.

## 2016-02-27 ENCOUNTER — Ambulatory Visit: Payer: Self-pay | Admitting: Orthopedic Surgery

## 2016-02-27 NOTE — H&P (Signed)
TOTAL HIP ADMISSION H&P  Patient is admitted for right total hip arthroplasty.  Subjective:  Chief Complaint: right hip pain  HPI: Jonathan Riley, 55 y.o. male, has a history of pain and functional disability in the right hip(s) due to arthritis and patient has failed non-surgical conservative treatments for greater than 12 weeks to include NSAID's and/or analgesics, flexibility and strengthening excercises, supervised PT with diminished ADL's post treatment, use of assistive devices and activity modification.  Onset of symptoms was gradual starting 2 years ago with gradually worsening course since that time.The patient noted no past surgery on the right hip(s).  Patient currently rates pain in the right hip at 10 out of 10 with activity. Patient has night pain, worsening of pain with activity and weight bearing, pain that interfers with activities of daily living, pain with passive range of motion and crepitus. Patient has evidence of subchondral cysts, subchondral sclerosis, periarticular osteophytes and joint space narrowing by imaging studies. This condition presents safety issues increasing the risk of falls. There is no current active infection.  Patient Active Problem List   Diagnosis Date Noted  . Alteration of consciousness 02/21/2013   Past Medical History:  Diagnosis Date  . Arthritis   . Depression   . Dyspnea    "with exertion"  . Hypertension   . Nocturia    1-2 times during night  . PONV (postoperative nausea and vomiting)    "nausea after a knee surgery"    Past Surgical History:  Procedure Laterality Date  . COLONOSCOPY    . KNEE SURGERY Bilateral    Right, Left x2  . WRIST SURGERY Right 2008 or 2009     (Not in a hospital admission) Allergies  Allergen Reactions  . Percocet [Oxycodone-Acetaminophen] Nausea And Vomiting    Social History  Substance Use Topics  . Smoking status: Former Smoker    Types: Cigarettes  . Smokeless tobacco: Never Used   Comment: "quit about 12 years ago" 02/26/2016  . Alcohol use No    Family History  Problem Relation Age of Onset  . Heart disease    . Diabetes    . Cancer       Review of Systems  Constitutional: Negative.   HENT: Negative.   Eyes: Negative.   Respiratory: Positive for shortness of breath.   Cardiovascular: Negative.   Gastrointestinal: Negative.   Musculoskeletal: Positive for back pain and joint pain.  Skin: Negative.   Neurological: Negative.   Endo/Heme/Allergies: Negative.   Psychiatric/Behavioral: Negative.     Objective:  Physical Exam  Vital signs in last 24 hours: @VSRANGES@  Labs:   Estimated body mass index is 34.44 kg/m as calculated from the following:   Height as of 02/26/16: 5' 10" (1.778 m).   Weight as of 02/26/16: 108.9 kg (240 lb).   Imaging Review Plain radiographs demonstrate severe degenerative joint disease of the right hip(s). The bone quality appears to be satisfactory for age and reported activity level.  Assessment/Plan:  End stage arthritis, right hip(s)  The patient history, physical examination, clinical judgement of the provider and imaging studies are consistent with end stage degenerative joint disease of the right hip(s) and total hip arthroplasty is deemed medically necessary. The treatment options including medical management, injection therapy, arthroscopy and arthroplasty were discussed at length. The risks and benefits of total hip arthroplasty were presented and reviewed. The risks due to aseptic loosening, infection, stiffness, dislocation/subluxation,  thromboembolic complications and other imponderables were discussed.  The patient   acknowledged the explanation, agreed to proceed with the plan and consent was signed. Patient is being admitted for inpatient treatment for surgery, pain control, PT, OT, prophylactic antibiotics, VTE prophylaxis, progressive ambulation and ADL's and discharge planning.The patient is planning to be  discharged home with home health services

## 2016-02-29 MED ORDER — TRANEXAMIC ACID 1000 MG/10ML IV SOLN
1000.0000 mg | INTRAVENOUS | Status: AC
  Administered 2016-03-03: 1000 mg via INTRAVENOUS
  Filled 2016-02-29: qty 10

## 2016-03-02 MED ORDER — CEFAZOLIN SODIUM-DEXTROSE 2-4 GM/100ML-% IV SOLN
2.0000 g | Freq: Once | INTRAVENOUS | Status: AC
  Administered 2016-03-03: 2 g via INTRAVENOUS
  Filled 2016-03-02: qty 100

## 2016-03-02 MED ORDER — DEXTROSE 5 % IV SOLN
3.0000 g | INTRAVENOUS | Status: DC
  Filled 2016-03-02: qty 3000

## 2016-03-03 ENCOUNTER — Inpatient Hospital Stay (HOSPITAL_COMMUNITY)
Admission: RE | Admit: 2016-03-03 | Discharge: 2016-03-04 | DRG: 470 | Disposition: A | Discharge status: Home / Self Care | Payer: BLUE CROSS/BLUE SHIELD | Source: Ambulatory Visit | Attending: Orthopedic Surgery | Admitting: Orthopedic Surgery

## 2016-03-03 ENCOUNTER — Inpatient Hospital Stay (HOSPITAL_COMMUNITY): Payer: BLUE CROSS/BLUE SHIELD

## 2016-03-03 ENCOUNTER — Encounter (HOSPITAL_COMMUNITY)
Admission: RE | Disposition: A | Discharge status: Home / Self Care | Payer: Self-pay | Source: Ambulatory Visit | Attending: Orthopedic Surgery

## 2016-03-03 ENCOUNTER — Inpatient Hospital Stay (HOSPITAL_COMMUNITY): Payer: BLUE CROSS/BLUE SHIELD | Admitting: Emergency Medicine

## 2016-03-03 ENCOUNTER — Inpatient Hospital Stay (HOSPITAL_COMMUNITY): Payer: BLUE CROSS/BLUE SHIELD | Admitting: Anesthesiology

## 2016-03-03 ENCOUNTER — Encounter (HOSPITAL_COMMUNITY): Payer: Self-pay | Admitting: *Deleted

## 2016-03-03 DIAGNOSIS — M25551 Pain in right hip: Secondary | ICD-10-CM | POA: Diagnosis present

## 2016-03-03 DIAGNOSIS — Z23 Encounter for immunization: Secondary | ICD-10-CM | POA: Diagnosis not present

## 2016-03-03 DIAGNOSIS — F329 Major depressive disorder, single episode, unspecified: Secondary | ICD-10-CM | POA: Diagnosis present

## 2016-03-03 DIAGNOSIS — Z6834 Body mass index (BMI) 34.0-34.9, adult: Secondary | ICD-10-CM

## 2016-03-03 DIAGNOSIS — Z885 Allergy status to narcotic agent status: Secondary | ICD-10-CM | POA: Diagnosis not present

## 2016-03-03 DIAGNOSIS — Z87891 Personal history of nicotine dependence: Secondary | ICD-10-CM

## 2016-03-03 DIAGNOSIS — M1611 Unilateral primary osteoarthritis, right hip: Principal | ICD-10-CM | POA: Diagnosis present

## 2016-03-03 DIAGNOSIS — I10 Essential (primary) hypertension: Secondary | ICD-10-CM | POA: Diagnosis present

## 2016-03-03 DIAGNOSIS — Z419 Encounter for procedure for purposes other than remedying health state, unspecified: Secondary | ICD-10-CM

## 2016-03-03 DIAGNOSIS — Z09 Encounter for follow-up examination after completed treatment for conditions other than malignant neoplasm: Secondary | ICD-10-CM

## 2016-03-03 HISTORY — PX: TOTAL HIP ARTHROPLASTY: SHX124

## 2016-03-03 SURGERY — ARTHROPLASTY, HIP, TOTAL, ANTERIOR APPROACH
Anesthesia: Monitor Anesthesia Care | Laterality: Right

## 2016-03-03 MED ORDER — METHOCARBAMOL 500 MG PO TABS
ORAL_TABLET | ORAL | Status: AC
  Filled 2016-03-03: qty 1

## 2016-03-03 MED ORDER — IRBESARTAN 150 MG PO TABS
150.0000 mg | ORAL_TABLET | Freq: Every day | ORAL | Status: DC
  Administered 2016-03-04: 150 mg via ORAL
  Filled 2016-03-03: qty 1

## 2016-03-03 MED ORDER — MIDAZOLAM HCL 5 MG/5ML IJ SOLN
INTRAMUSCULAR | Status: DC | PRN
  Administered 2016-03-03: 2 mg via INTRAVENOUS

## 2016-03-03 MED ORDER — FENTANYL CITRATE (PF) 100 MCG/2ML IJ SOLN
INTRAMUSCULAR | Status: AC
  Filled 2016-03-03: qty 2

## 2016-03-03 MED ORDER — PROPOFOL 500 MG/50ML IV EMUL
INTRAVENOUS | Status: DC | PRN
  Administered 2016-03-03: 75 ug/kg/min via INTRAVENOUS

## 2016-03-03 MED ORDER — PHENYLEPHRINE HCL 10 MG/ML IJ SOLN
INTRAVENOUS | Status: DC | PRN
  Administered 2016-03-03: 25 ug/min via INTRAVENOUS

## 2016-03-03 MED ORDER — INFLUENZA VAC SPLIT QUAD 0.5 ML IM SUSY
0.5000 mL | PREFILLED_SYRINGE | INTRAMUSCULAR | Status: AC
  Administered 2016-03-04: 0.5 mL via INTRAMUSCULAR
  Filled 2016-03-03: qty 0.5

## 2016-03-03 MED ORDER — KETOROLAC TROMETHAMINE 15 MG/ML IJ SOLN
INTRAMUSCULAR | Status: AC
  Filled 2016-03-03: qty 1

## 2016-03-03 MED ORDER — DEXAMETHASONE SODIUM PHOSPHATE 10 MG/ML IJ SOLN
INTRAMUSCULAR | Status: DC | PRN
  Administered 2016-03-03: 10 mg via INTRAVENOUS

## 2016-03-03 MED ORDER — PROPOFOL 10 MG/ML IV BOLUS
INTRAVENOUS | Status: DC | PRN
  Administered 2016-03-03: 50 mg via INTRAVENOUS

## 2016-03-03 MED ORDER — DEXAMETHASONE SODIUM PHOSPHATE 10 MG/ML IJ SOLN
10.0000 mg | Freq: Once | INTRAMUSCULAR | Status: AC
  Administered 2016-03-04: 10 mg via INTRAVENOUS
  Filled 2016-03-03: qty 1

## 2016-03-03 MED ORDER — ACETAMINOPHEN 10 MG/ML IV SOLN
1000.0000 mg | INTRAVENOUS | Status: AC
  Administered 2016-03-03: 1000 mg via INTRAVENOUS
  Filled 2016-03-03: qty 100

## 2016-03-03 MED ORDER — ASPIRIN 81 MG PO CHEW
81.0000 mg | CHEWABLE_TABLET | Freq: Two times a day (BID) | ORAL | Status: DC
  Administered 2016-03-03 – 2016-03-04 (×2): 81 mg via ORAL
  Filled 2016-03-03 (×2): qty 1

## 2016-03-03 MED ORDER — LACTATED RINGERS IV SOLN
INTRAVENOUS | Status: DC | PRN
  Administered 2016-03-03 (×3): via INTRAVENOUS

## 2016-03-03 MED ORDER — SODIUM CHLORIDE 0.9 % IV SOLN
INTRAVENOUS | Status: DC | PRN
Start: 2016-03-03 — End: 2016-03-03
  Administered 2016-03-03: 500 mL

## 2016-03-03 MED ORDER — PHENOL 1.4 % MT LIQD: 1.0000 | OROMUCOSAL | Status: DC | PRN

## 2016-03-03 MED ORDER — ONDANSETRON HCL 4 MG PO TABS: 4.0000 mg | ORAL_TABLET | Freq: Four times a day (QID) | ORAL | Status: DC | PRN

## 2016-03-03 MED ORDER — FENTANYL CITRATE (PF) 100 MCG/2ML IJ SOLN
INTRAMUSCULAR | Status: DC | PRN
  Administered 2016-03-03: 100 ug via INTRAVENOUS
  Administered 2016-03-03 (×3): 25 ug via INTRAVENOUS

## 2016-03-03 MED ORDER — ONDANSETRON HCL 4 MG/2ML IJ SOLN: 4.0000 mg | Freq: Four times a day (QID) | INTRAMUSCULAR | Status: DC | PRN

## 2016-03-03 MED ORDER — DOCUSATE SODIUM 100 MG PO CAPS
100.0000 mg | ORAL_CAPSULE | Freq: Two times a day (BID) | ORAL | Status: DC
  Administered 2016-03-03 – 2016-03-04 (×2): 100 mg via ORAL
  Filled 2016-03-03 (×2): qty 1

## 2016-03-03 MED ORDER — DEXAMETHASONE SODIUM PHOSPHATE 10 MG/ML IJ SOLN
INTRAMUSCULAR | Status: AC
  Filled 2016-03-03: qty 1

## 2016-03-03 MED ORDER — HYDROMORPHONE HCL 2 MG/ML IJ SOLN
0.5000 mg | INTRAMUSCULAR | Status: DC | PRN
  Administered 2016-03-03: 0.5 mg via INTRAVENOUS
  Filled 2016-03-03: qty 1

## 2016-03-03 MED ORDER — LIDOCAINE HCL (CARDIAC) 20 MG/ML IV SOLN
INTRAVENOUS | Status: DC | PRN
  Administered 2016-03-03: 50 mg via INTRAVENOUS

## 2016-03-03 MED ORDER — METOCLOPRAMIDE HCL 5 MG/ML IJ SOLN
INTRAMUSCULAR | Status: DC | PRN
  Administered 2016-03-03: 10 mg via INTRAVENOUS

## 2016-03-03 MED ORDER — SODIUM CHLORIDE 0.9 % IR SOLN
Status: DC | PRN
  Administered 2016-03-03: 3000 mL

## 2016-03-03 MED ORDER — ONDANSETRON HCL 4 MG/2ML IJ SOLN
INTRAMUSCULAR | Status: DC | PRN
  Administered 2016-03-03: 4 mg via INTRAVENOUS

## 2016-03-03 MED ORDER — BUPIVACAINE HCL (PF) 0.5 % IJ SOLN
INTRAMUSCULAR | Status: AC
  Filled 2016-03-03: qty 30

## 2016-03-03 MED ORDER — ACETAMINOPHEN 325 MG PO TABS: 650.0000 mg | ORAL_TABLET | Freq: Four times a day (QID) | ORAL | Status: DC | PRN

## 2016-03-03 MED ORDER — SODIUM CHLORIDE 0.9 % IV SOLN: INTRAVENOUS | Status: DC

## 2016-03-03 MED ORDER — SODIUM CHLORIDE 0.9 % IJ SOLN
INTRAMUSCULAR | Status: DC | PRN
  Administered 2016-03-03: 30 mL

## 2016-03-03 MED ORDER — HYDROCODONE-ACETAMINOPHEN 5-325 MG PO TABS
ORAL_TABLET | ORAL | Status: AC
  Filled 2016-03-03: qty 2

## 2016-03-03 MED ORDER — POVIDONE-IODINE 10 % EX SWAB
2.0000 "application " | Freq: Once | CUTANEOUS | Status: AC
  Administered 2016-03-03: 2 via TOPICAL

## 2016-03-03 MED ORDER — ONDANSETRON HCL 4 MG/2ML IJ SOLN
INTRAMUSCULAR | Status: AC
  Filled 2016-03-03: qty 2

## 2016-03-03 MED ORDER — HYDROMORPHONE HCL 1 MG/ML IJ SOLN: 0.5000 mg | INTRAMUSCULAR | Status: DC | PRN

## 2016-03-03 MED ORDER — SODIUM CHLORIDE 0.9 % IV SOLN
INTRAVENOUS | Status: DC
  Administered 2016-03-04: 150 mL/h via INTRAVENOUS

## 2016-03-03 MED ORDER — SENNA 8.6 MG PO TABS
2.0000 | ORAL_TABLET | Freq: Every day | ORAL | Status: DC
  Administered 2016-03-03: 17.2 mg via ORAL
  Filled 2016-03-03: qty 2

## 2016-03-03 MED ORDER — ACETAMINOPHEN 650 MG RE SUPP: 650.0000 mg | Freq: Four times a day (QID) | RECTAL | Status: DC | PRN

## 2016-03-03 MED ORDER — PHENYLEPHRINE HCL 10 MG/ML IJ SOLN
INTRAMUSCULAR | Status: DC | PRN
  Administered 2016-03-03: 80 ug via INTRAVENOUS

## 2016-03-03 MED ORDER — KETOROLAC TROMETHAMINE 30 MG/ML IJ SOLN
INTRAMUSCULAR | Status: DC | PRN
  Administered 2016-03-03: 30 mg

## 2016-03-03 MED ORDER — POLYETHYLENE GLYCOL 3350 17 G PO PACK: 17.0000 g | PACK | Freq: Every day | ORAL | Status: DC | PRN

## 2016-03-03 MED ORDER — MIDAZOLAM HCL 2 MG/2ML IJ SOLN
INTRAMUSCULAR | Status: AC
  Filled 2016-03-03: qty 2

## 2016-03-03 MED ORDER — CHLORHEXIDINE GLUCONATE 4 % EX LIQD: 60.0000 mL | Freq: Once | CUTANEOUS | Status: DC

## 2016-03-03 MED ORDER — METHOCARBAMOL 500 MG PO TABS
500.0000 mg | ORAL_TABLET | Freq: Four times a day (QID) | ORAL | Status: DC | PRN
  Administered 2016-03-03 – 2016-03-04 (×2): 500 mg via ORAL
  Filled 2016-03-03: qty 1

## 2016-03-03 MED ORDER — CEFAZOLIN SODIUM-DEXTROSE 2-4 GM/100ML-% IV SOLN
2.0000 g | Freq: Four times a day (QID) | INTRAVENOUS | Status: AC
  Administered 2016-03-03 – 2016-03-04 (×2): 2 g via INTRAVENOUS
  Filled 2016-03-03 (×2): qty 100

## 2016-03-03 MED ORDER — AMLODIPINE BESYLATE 5 MG PO TABS
5.0000 mg | ORAL_TABLET | Freq: Every day | ORAL | Status: DC
  Administered 2016-03-04: 5 mg via ORAL
  Filled 2016-03-03: qty 1

## 2016-03-03 MED ORDER — METOCLOPRAMIDE HCL 5 MG PO TABS: 5.0000 mg | ORAL_TABLET | Freq: Three times a day (TID) | ORAL | Status: DC | PRN

## 2016-03-03 MED ORDER — METOCLOPRAMIDE HCL 5 MG/ML IJ SOLN
INTRAMUSCULAR | Status: AC
  Filled 2016-03-03: qty 2

## 2016-03-03 MED ORDER — HYDROCODONE-ACETAMINOPHEN 5-325 MG PO TABS
1.0000 | ORAL_TABLET | ORAL | Status: DC | PRN
  Administered 2016-03-03 – 2016-03-04 (×3): 2 via ORAL
  Filled 2016-03-03 (×2): qty 2

## 2016-03-03 MED ORDER — POVIDONE-IODINE 7.5 % EX SOLN
CUTANEOUS | Status: DC | PRN
  Administered 2016-03-03: 1 via TOPICAL

## 2016-03-03 MED ORDER — PROPOFOL 10 MG/ML IV BOLUS
INTRAVENOUS | Status: AC
  Filled 2016-03-03: qty 20

## 2016-03-03 MED ORDER — METHOCARBAMOL 1000 MG/10ML IJ SOLN
500.0000 mg | Freq: Four times a day (QID) | INTRAVENOUS | Status: DC | PRN
  Filled 2016-03-03: qty 5

## 2016-03-03 MED ORDER — DIPHENHYDRAMINE HCL 12.5 MG/5ML PO ELIX: 12.5000 mg | ORAL_SOLUTION | ORAL | Status: DC | PRN

## 2016-03-03 MED ORDER — KETOROLAC TROMETHAMINE 15 MG/ML IJ SOLN
15.0000 mg | Freq: Four times a day (QID) | INTRAMUSCULAR | Status: AC
  Administered 2016-03-03 – 2016-03-04 (×3): 15 mg via INTRAVENOUS
  Filled 2016-03-03 (×3): qty 1

## 2016-03-03 MED ORDER — EPHEDRINE SULFATE 50 MG/ML IJ SOLN
INTRAMUSCULAR | Status: DC | PRN
  Administered 2016-03-03: 5 mg via INTRAVENOUS

## 2016-03-03 MED ORDER — SODIUM CHLORIDE 0.9 % IV SOLN
1000.0000 mg | Freq: Once | INTRAVENOUS | Status: AC
  Administered 2016-03-03: 1000 mg via INTRAVENOUS
  Filled 2016-03-03: qty 10

## 2016-03-03 MED ORDER — BUPIVACAINE IN DEXTROSE 0.75-8.25 % IT SOLN
INTRATHECAL | Status: DC | PRN
  Administered 2016-03-03: 15 mg via INTRATHECAL

## 2016-03-03 MED ORDER — KETOROLAC TROMETHAMINE 30 MG/ML IJ SOLN
INTRAMUSCULAR | Status: AC
  Filled 2016-03-03: qty 1

## 2016-03-03 MED ORDER — MENTHOL 3 MG MT LOZG: 1.0000 | LOZENGE | OROMUCOSAL | Status: DC | PRN

## 2016-03-03 MED ORDER — PROPOFOL 1000 MG/100ML IV EMUL
INTRAVENOUS | Status: AC
  Filled 2016-03-03: qty 200

## 2016-03-03 MED ORDER — LIDOCAINE 2% (20 MG/ML) 5 ML SYRINGE
INTRAMUSCULAR | Status: AC
  Filled 2016-03-03: qty 5

## 2016-03-03 MED ORDER — ALBUMIN HUMAN 5 % IV SOLN
INTRAVENOUS | Status: DC | PRN
  Administered 2016-03-03: 17:00:00 via INTRAVENOUS

## 2016-03-03 MED ORDER — METOCLOPRAMIDE HCL 5 MG/ML IJ SOLN: 5.0000 mg | Freq: Three times a day (TID) | INTRAMUSCULAR | Status: DC | PRN

## 2016-03-03 MED ORDER — 0.9 % SODIUM CHLORIDE (POUR BTL) OPTIME
TOPICAL | Status: DC | PRN
  Administered 2016-03-03: 1000 mL

## 2016-03-03 MED ORDER — BUPIVACAINE HCL (PF) 0.5 % IJ SOLN
INTRAMUSCULAR | Status: DC | PRN
  Administered 2016-03-03: 30 mL

## 2016-03-03 SURGICAL SUPPLY — 61 items
ALCOHOL ISOPROPYL (RUBBING) (MISCELLANEOUS) ×3 IMPLANT
BLADE SURG ROTATE 9660 (MISCELLANEOUS) IMPLANT
CABLE READY CERCLAGE W/CRIP (Trauma Fixation) ×3 IMPLANT
CHLORAPREP W/TINT 26ML (MISCELLANEOUS) ×3 IMPLANT
COVER SURGICAL LIGHT HANDLE (MISCELLANEOUS) ×3 IMPLANT
DERMABOND ADVANCED (GAUZE/BANDAGES/DRESSINGS) ×2
DERMABOND ADVANCED .7 DNX12 (GAUZE/BANDAGES/DRESSINGS) ×1 IMPLANT
DRAPE C-ARM 42X72 X-RAY (DRAPES) ×3 IMPLANT
DRAPE IMP U-DRAPE 54X76 (DRAPES) ×6 IMPLANT
DRAPE STERI IOBAN 125X83 (DRAPES) ×3 IMPLANT
DRAPE U-SHAPE 47X51 STRL (DRAPES) ×9 IMPLANT
DRSG AQUACEL AG ADV 3.5X10 (GAUZE/BANDAGES/DRESSINGS) ×3 IMPLANT
DRSG AQUACEL AG ADV 3.5X14 (GAUZE/BANDAGES/DRESSINGS) ×3 IMPLANT
ELECT BLADE 4.0 EZ CLEAN MEGAD (MISCELLANEOUS) ×3
ELECT REM PT RETURN 9FT ADLT (ELECTROSURGICAL) ×3
ELECTRODE BLDE 4.0 EZ CLN MEGD (MISCELLANEOUS) ×1 IMPLANT
ELECTRODE REM PT RTRN 9FT ADLT (ELECTROSURGICAL) ×1 IMPLANT
EVACUATOR 1/8 PVC DRAIN (DRAIN) IMPLANT
GLOVE BIO SURGEON STRL SZ8.5 (GLOVE) ×6 IMPLANT
GLOVE BIOGEL PI IND STRL 8.5 (GLOVE) ×1 IMPLANT
GLOVE BIOGEL PI INDICATOR 8.5 (GLOVE) ×2
GOWN STRL REUS W/ TWL LRG LVL3 (GOWN DISPOSABLE) ×2 IMPLANT
GOWN STRL REUS W/TWL 2XL LVL3 (GOWN DISPOSABLE) ×3 IMPLANT
GOWN STRL REUS W/TWL LRG LVL3 (GOWN DISPOSABLE) ×4
GUIDEWIRE BALL NOSE 80CM (WIRE) ×3 IMPLANT
HANDPIECE INTERPULSE COAX TIP (DISPOSABLE) ×2
HEAD FEM -6XOFST 36XMDLR (Orthopedic Implant) ×1 IMPLANT
HEAD MODULAR 36MM (Orthopedic Implant) ×2 IMPLANT
HOOD PEEL AWAY FACE SHEILD DIS (HOOD) ×9 IMPLANT
KIT BASIN OR (CUSTOM PROCEDURE TRAY) ×3 IMPLANT
KIT ROOM TURNOVER OR (KITS) ×3 IMPLANT
LINER ACETABULAR NEUTRAL 36MM (Hips) ×3 IMPLANT
MANIFOLD NEPTUNE II (INSTRUMENTS) ×3 IMPLANT
MARKER SKIN DUAL TIP RULER LAB (MISCELLANEOUS) ×6 IMPLANT
NEEDLE SPNL 18GX3.5 QUINCKE PK (NEEDLE) ×3 IMPLANT
NS IRRIG 1000ML POUR BTL (IV SOLUTION) ×3 IMPLANT
PACK TOTAL JOINT (CUSTOM PROCEDURE TRAY) ×3 IMPLANT
PACK UNIVERSAL I (CUSTOM PROCEDURE TRAY) ×3 IMPLANT
PAD ARMBOARD 7.5X6 YLW CONV (MISCELLANEOUS) ×6 IMPLANT
SAW OSC TIP CART 19.5X105X1.3 (SAW) ×3 IMPLANT
SEALER BIPOLAR AQUA 6.0 (INSTRUMENTS) IMPLANT
SET HNDPC FAN SPRY TIP SCT (DISPOSABLE) ×1 IMPLANT
SHELL ACETABULAR 3H 58MM G7 (Hips) ×3 IMPLANT
SOLUTION BETADINE 4OZ (MISCELLANEOUS) ×3 IMPLANT
STEM FEM ARCOS MOD 14X175 (Stem) ×2 IMPLANT
SUCTION FRAZIER HANDLE 10FR (MISCELLANEOUS) ×2
SUCTION TUBE FRAZIER 10FR DISP (MISCELLANEOUS) ×1 IMPLANT
SUT ETHIBOND NAB CT1 #1 30IN (SUTURE) ×6 IMPLANT
SUT MNCRL AB 3-0 PS2 18 (SUTURE) ×3 IMPLANT
SUT MON AB 2-0 CT1 36 (SUTURE) ×6 IMPLANT
SUT VIC AB 1 CT1 27 (SUTURE) ×2
SUT VIC AB 1 CT1 27XBRD ANBCTR (SUTURE) ×1 IMPLANT
SUT VIC AB 2-0 CT1 27 (SUTURE) ×2
SUT VIC AB 2-0 CT1 TAPERPNT 27 (SUTURE) ×1 IMPLANT
SUT VLOC 180 0 24IN GS25 (SUTURE) ×3 IMPLANT
SYR 50ML LL SCALE MARK (SYRINGE) ×3 IMPLANT
TOWEL OR 17X24 6PK STRL BLUE (TOWEL DISPOSABLE) ×3 IMPLANT
TOWEL OR 17X26 10 PK STRL BLUE (TOWEL DISPOSABLE) ×3 IMPLANT
TRAY CATH 16FR W/PLASTIC CATH (SET/KITS/TRAYS/PACK) ×3 IMPLANT
TRAY FOLEY CATH 16FR SILVER (SET/KITS/TRAYS/PACK) IMPLANT
WATER STERILE IRR 1000ML POUR (IV SOLUTION) ×9 IMPLANT

## 2016-03-03 NOTE — Anesthesia Preprocedure Evaluation (Addendum)
Anesthesia Evaluation  Patient identified by MRN, date of birth, ID band Patient awake    Reviewed: Allergy & Precautions, NPO status , Patient's Chart, lab work & pertinent test results  History of Anesthesia Complications (+) PONV and history of anesthetic complications  Airway Mallampati: II  TM Distance: >3 FB Neck ROM: Full    Dental no notable dental hx. (+) Dental Advisory Given, Edentulous Lower, Upper Dentures   Pulmonary neg pulmonary ROS, former smoker,    Pulmonary exam normal        Cardiovascular hypertension, Normal cardiovascular exam     Neuro/Psych Depression negative neurological ROS     GI/Hepatic negative GI ROS, Neg liver ROS,   Endo/Other  Morbid obesity  Renal/GU      Musculoskeletal   Abdominal   Peds  Hematology   Anesthesia Other Findings   Reproductive/Obstetrics                            Anesthesia Physical Anesthesia Plan  ASA: III  Anesthesia Plan: MAC and Spinal   Post-op Pain Management:    Induction: Intravenous  Airway Management Planned: Natural Airway and Simple Face Mask  Additional Equipment:   Intra-op Plan:   Post-operative Plan:   Informed Consent: I have reviewed the patients History and Physical, chart, labs and discussed the procedure including the risks, benefits and alternatives for the proposed anesthesia with the patient or authorized representative who has indicated his/her understanding and acceptance.   Dental advisory given  Plan Discussed with:   Anesthesia Plan Comments:        Anesthesia Quick Evaluation

## 2016-03-03 NOTE — Anesthesia Postprocedure Evaluation (Signed)
Anesthesia Post Note  Patient: Fayrene FearingJames A Findling  Procedure(s) Performed: Procedure(s) (LRB): TOTAL HIP ARTHROPLASTY ANTERIOR APPROACH (Right)  Patient location during evaluation: PACU Anesthesia Type: Spinal and MAC Level of consciousness: awake and alert Pain management: pain level controlled Vital Signs Assessment: post-procedure vital signs reviewed and stable Respiratory status: spontaneous breathing and respiratory function stable Cardiovascular status: blood pressure returned to baseline and stable Postop Assessment: spinal receding Anesthetic complications: no    Last Vitals:  Vitals:   03/03/16 2045 03/03/16 2102  BP: 101/60 110/64  Pulse: 69 70  Resp: 18 17  Temp: 36.6 C 37.1 C    Last Pain:  Vitals:   03/03/16 2102  TempSrc: Oral  PainSc:                  Hazelynn Mckenny,Dream DANIEL

## 2016-03-03 NOTE — Interval H&P Note (Signed)
History and Physical Interval Note:  03/03/2016 2:42 PM  Jonathan Riley  has presented today for surgery, with the diagnosis of DEGENERATIVE JOINT RIGHT HIP  The various methods of treatment have been discussed with the patient and family. After consideration of risks, benefits and other options for treatment, the patient has consented to  Procedure(s): TOTAL HIP ARTHROPLASTY ANTERIOR APPROACH (Right) as a surgical intervention .  The patient's history has been reviewed, patient examined, no change in status, stable for surgery.  I have reviewed the patient's chart and labs.  Questions were answered to the patient's satisfaction.     Nikkie Liming, Cloyde ReamsBrian Kailash

## 2016-03-03 NOTE — Op Note (Signed)
OPERATIVE REPORT  SURGEON: Samson FredericBrian Aleyssa Pike, MD   ASSISTANT: Hart CarwinJustin Queen, RNFA.  PREOPERATIVE DIAGNOSIS: Right hip arthritis.   POSTOPERATIVE DIAGNOSIS: Right hip arthritis.   PROCEDURE: Right total hip arthroplasty, anterior approach.   IMPLANTS: Biomet Arcos 1 piece stem, collared, hi offset, size 14 x 175 mm. Biomet G7 acetabular shell, size 58. Biomet E1 antioxidant liner, 36 mm neutral. Biomet metal head 36 -6 mm. Zimmer 1.8 mm adult reconstruction cable.  ANESTHESIA:  Spinal  ESTIMATED BLOOD LOSS: 550 mL  ANTIBIOTICS: 2 g Ancef.  DRAINS: None.  COMPLICATIONS: Perforation of proximal femur.   CONDITION: PACU - hemodynamically stable.   BRIEF CLINICAL NOTE: Rolene ArbourJames A Grell is a 55 y.o. male with a long-standing history of Right hip arthritis. After failing conservative management, the patient was indicated for total hip arthroplasty. The risks, benefits, and alternatives to the procedure were explained, and the patient elected to proceed.  PROCEDURE IN DETAIL: Surgical site was marked by myself. Once inside the operative room, spinal anesthesia was obtained, and a foley catheter was inserted. The patient was then positioned on the Hana table. All bony prominences were well padded. The hip was prepped and draped in the normal sterile surgical fashion. A time-out was called verifying side and site of surgery. The patient received IV antibiotics within 60 minutes of beginning the procedure.  The direct anterior approach to the hip was performed through the Hueter interval. Lateral femoral circumflex vessels were treated with the Auqumantys. The anterior capsule was exposed and an inverted T capsulotomy was made.The femoral neck cut was made to the level of the templated cut. A corkscrew was placed into the head and the head was removed. The femoral head was found to have eburnated bone. The head was passed to the back table and was measured.  Acetabular exposure  was achieved, and the pulvinar and labrum were excised. Sequental reaming of the acetabulum was then performed up to a size 57 mm reamer. A 58 mm cup was then opened and impacted into place at approximately 40 degrees of abduction and 20 degrees of anteversion. The final polyethylene liner was impacted into place and acetabular osteophytes were removed.   I then gained femoral exposure taking care to protect the abductors and greater trochanter. This was performed using standard external rotation, extension, and adduction. The capsule was peeled off the inner aspect of the greater trochanter, taking care to preserve the short external rotators. A cookie cutter was used to enter the femoral canal. The bone in the proximal femur was extremely hard, and I was unable to pass the canal finder successfully. I used a size 4 broach for the Taperloc micro-plasty system, and the bone was noted to be very sclerotic. Once the broach was passed, I obtained x-rays which showed a perforation of the proximal femur at the level of the lesser trochanter. I removed the broach. I used a rat-tooth rasp and fluoroscopy to find the femoral canal. I then decided to proceed with a distal fixation system. First, I place a prophylactic adult reconstruction cable subperiosteally just above the level of the lesser trochanter. I placed a guidewire down the femoral canal using fluoroscopy. I then sequentially reamed up to a 13.5 mm reamer with excellent chatter. Sequential broaching was performed up to a size 14. Calcar planer was used on the femoral neck remnant. I placed a hi offset neck and a trial head ball. The hip was reduced. Leg lengths and offset were checked fluoroscopically. The hip was dislocated  and trial components were removed. The final implants were placed, and the hip was reduced.  The final stem sat somewhat proud, however I was able to recreate leg lengths with a -6 head ball. Fluoroscopy was used to confirm  component position and leg lengths. At 90 degrees of external rotation and full extension, the hip was stable to an anterior directed force.  The wound was copiously irrigated with a dilute betadine solution followed by normal saline. Marcaine solution was injected into the periarticular soft tissue. The wound was closed in layers using #1 Vicryl and V-Loc for the fascia, 2-0 Vicryl for the subcutaneous fat, 2-0 Monocryl for the deep dermal layer, 3-0 running Monocryl subcuticular stitch, and Dermabond for the skin. Once the glue was fully dried, an Aquacell Ag dressing was applied. The patient was transported to the recovery room in stable condition. Sponge, needle, and instrument counts were correct at the end of the case x2. The patient tolerated the procedure well and there were no known complications.

## 2016-03-03 NOTE — Anesthesia Procedure Notes (Addendum)
Spinal  Patient location during procedure: OR Start time: 03/03/2016 3:54 PM End time: 03/03/2016 2:59 PM Staffing Anesthesiologist: Heather RobertsSINGER, Rashaan Performed: anesthesiologist  Preanesthetic Checklist Completed: patient identified, surgical consent, pre-op evaluation, timeout performed, IV checked, risks and benefits discussed and monitors and equipment checked Spinal Block Patient position: sitting Prep: DuraPrep Patient monitoring: cardiac monitor, continuous pulse ox and blood pressure Approach: midline Location: L2-3 Injection technique: single-shot Needle Needle type: Pencan  Needle gauge: 24 G Needle length: 9 cm Additional Notes Functioning IV was confirmed and monitors were applied. Sterile prep and drape, including hand hygiene and sterile gloves were used. The patient was positioned and the spine was prepped. The skin was anesthetized with lidocaine.  Free flow of clear CSF was obtained prior to injecting local anesthetic into the CSF.  The spinal needle aspirated freely following injection.  The needle was carefully withdrawn.  The patient tolerated the procedure well.

## 2016-03-03 NOTE — Transfer of Care (Signed)
Immediate Anesthesia Transfer of Care Note  Patient: Jonathan Riley  Procedure(s) Performed: Procedure(s): TOTAL HIP ARTHROPLASTY ANTERIOR APPROACH (Right)  Patient Location: PACU  Anesthesia Type:Spinal  Level of Consciousness: awake, alert  and oriented  Airway & Oxygen Therapy: Patient Spontanous Breathing and Patient connected to nasal cannula oxygen  Post-op Assessment: Report given to RN, Post -op Vital signs reviewed and stable and Patient moving all extremities  Post vital signs: Reviewed and stable  Last Vitals:  Vitals:   03/03/16 1245 03/03/16 1900  BP: (!) 167/85   Pulse: 71   Resp: 20   Temp: 37 C (P) 36.6 C    Last Pain:  Vitals:   03/03/16 1245  TempSrc: Oral      Patients Stated Pain Goal: 3 (03/03/16 1233)  Complications: No apparent anesthesia complications

## 2016-03-03 NOTE — H&P (View-Only) (Signed)
TOTAL HIP ADMISSION H&P  Patient is admitted for right total hip arthroplasty.  Subjective:  Chief Complaint: right hip pain  HPI: Jonathan Riley, 55 y.o. male, has a history of pain and functional disability in the right hip(s) due to arthritis and patient has failed non-surgical conservative treatments for greater than 12 weeks to include NSAID's and/or analgesics, flexibility and strengthening excercises, supervised PT with diminished ADL's post treatment, use of assistive devices and activity modification.  Onset of symptoms was gradual starting 2 years ago with gradually worsening course since that time.The patient noted no past surgery on the right hip(s).  Patient currently rates pain in the right hip at 10 out of 10 with activity. Patient has night pain, worsening of pain with activity and weight bearing, pain that interfers with activities of daily living, pain with passive range of motion and crepitus. Patient has evidence of subchondral cysts, subchondral sclerosis, periarticular osteophytes and joint space narrowing by imaging studies. This condition presents safety issues increasing the risk of falls. There is no current active infection.  Patient Active Problem List   Diagnosis Date Noted  . Alteration of consciousness 02/21/2013   Past Medical History:  Diagnosis Date  . Arthritis   . Depression   . Dyspnea    "with exertion"  . Hypertension   . Nocturia    1-2 times during night  . PONV (postoperative nausea and vomiting)    "nausea after a knee surgery"    Past Surgical History:  Procedure Laterality Date  . COLONOSCOPY    . KNEE SURGERY Bilateral    Right, Left x2  . WRIST SURGERY Right 2008 or 2009     (Not in a hospital admission) Allergies  Allergen Reactions  . Percocet [Oxycodone-Acetaminophen] Nausea And Vomiting    Social History  Substance Use Topics  . Smoking status: Former Smoker    Types: Cigarettes  . Smokeless tobacco: Never Used   Comment: "quit about 12 years ago" 02/26/2016  . Alcohol use No    Family History  Problem Relation Age of Onset  . Heart disease    . Diabetes    . Cancer       Review of Systems  Constitutional: Negative.   HENT: Negative.   Eyes: Negative.   Respiratory: Positive for shortness of breath.   Cardiovascular: Negative.   Gastrointestinal: Negative.   Musculoskeletal: Positive for back pain and joint pain.  Skin: Negative.   Neurological: Negative.   Endo/Heme/Allergies: Negative.   Psychiatric/Behavioral: Negative.     Objective:  Physical Exam  Vital signs in last 24 hours: @VSRANGES @  Labs:   Estimated body mass index is 34.44 kg/m as calculated from the following:   Height as of 02/26/16: 5\' 10"  (1.778 m).   Weight as of 02/26/16: 108.9 kg (240 lb).   Imaging Review Plain radiographs demonstrate severe degenerative joint disease of the right hip(s). The bone quality appears to be satisfactory for age and reported activity level.  Assessment/Plan:  End stage arthritis, right hip(s)  The patient history, physical examination, clinical judgement of the provider and imaging studies are consistent with end stage degenerative joint disease of the right hip(s) and total hip arthroplasty is deemed medically necessary. The treatment options including medical management, injection therapy, arthroscopy and arthroplasty were discussed at length. The risks and benefits of total hip arthroplasty were presented and reviewed. The risks due to aseptic loosening, infection, stiffness, dislocation/subluxation,  thromboembolic complications and other imponderables were discussed.  The patient  acknowledged the explanation, agreed to proceed with the plan and consent was signed. Patient is being admitted for inpatient treatment for surgery, pain control, PT, OT, prophylactic antibiotics, VTE prophylaxis, progressive ambulation and ADL's and discharge planning.The patient is planning to be  discharged home with home health services

## 2016-03-03 NOTE — Discharge Instructions (Signed)
°Dr. Izabelle Daus °Joint Replacement Specialist °[facility] °[address] °[place], [place] [address] °[phone] ° ° °TOTAL HIP REPLACEMENT POSTOPERATIVE DIRECTIONS ° ° ° °Hip Rehabilitation, Guidelines Following Surgery  ° °WEIGHT BEARING °Weight bearing as tolerated with assist device (walker, cane, etc) as directed, use it as long as suggested by your surgeon or therapist, typically at least 4-6 weeks. ° °The results of a hip operation are greatly improved after range of motion and muscle strengthening exercises. Follow all safety measures which are given to protect your hip. If any of these exercises cause increased pain or swelling in your joint, decrease the amount until you are comfortable again. Then slowly increase the exercises. Call your caregiver if you have problems or questions.  ° °HOME CARE INSTRUCTIONS  °Most of the following instructions are designed to prevent the dislocation of your new hip.  °Remove items at home which could result in a fall. This includes throw rugs or furniture in walking pathways.  °Continue medications as instructed at time of discharge. °· You may have some home medications which will be placed on hold until you complete the course of blood thinner medication. °· You may start showering once you are discharged home. Do not remove your dressing. °Do not put on socks or shoes without following the instructions of your caregivers.   °Sit on chairs with arms. Use the chair arms to help push yourself up when arising.  °Arrange for the use of a toilet seat elevator so you are not sitting low.  °· Walk with walker as instructed.  °You may resume a sexual relationship in one month or when given the OK by your caregiver.  °Use walker as long as suggested by your caregivers.  °You may put full weight on your legs and walk as much as is comfortable. °Avoid periods of inactivity such as sitting longer than an hour when not asleep. This helps prevent  blood clots.  °You may return to work once you are cleared by your surgeon.  °Do not drive a car for 6 weeks or until released by your surgeon.  °Do not drive while taking narcotics.  °Wear elastic stockings for two weeks following surgery during the day but you may remove then at night.  °Make sure you keep all of your appointments after your operation with all of your doctors and caregivers. You should call the office at the above phone number and make an appointment for approximately two weeks after the date of your surgery. °Please pick up a stool softener and laxative for home use as long as you are requiring pain medications. °· ICE to the affected hip every three hours for 30 minutes at a time and then as needed for pain and swelling. Continue to use ice on the hip for pain and swelling from surgery. You may notice swelling that will progress down to the foot and ankle.  This is normal after surgery.  Elevate the leg when you are not up walking on it.   °It is important for you to complete the blood thinner medication as prescribed by your doctor. °· Continue to use the breathing machine which will help keep your temperature down.  It is common for your temperature to cycle up and down following surgery, especially at night when you are not up moving around and exerting yourself.  The breathing machine keeps your lungs expanded and your temperature down. ° °RANGE OF MOTION AND STRENGTHENING EXERCISES  °These exercises are   designed to help you keep full movement of your hip joint. Follow your caregiver's or physical therapist's instructions. Perform all exercises about fifteen times, three times per day or as directed. Exercise both hips, even if you have had only one joint replacement. These exercises can be done on a training (exercise) mat, on the floor, on a table or on a bed. Use whatever works the best and is most comfortable for you. Use music or television while you are exercising so that the exercises  are a pleasant break in your day. This will make your life better with the exercises acting as a break in routine you can look forward to.  °Lying on your back, slowly slide your foot toward your buttocks, raising your knee up off the floor. Then slowly slide your foot back down until your leg is straight again.  °Lying on your back spread your legs as far apart as you can without causing discomfort.  °Lying on your side, raise your upper leg and foot straight up from the floor as far as is comfortable. Slowly lower the leg and repeat.  °Lying on your back, tighten up the muscle in the front of your thigh (quadriceps muscles). You can do this by keeping your leg straight and trying to raise your heel off the floor. This helps strengthen the largest muscle supporting your knee.  °Lying on your back, tighten up the muscles of your buttocks both with the legs straight and with the knee bent at a comfortable angle while keeping your heel on the floor.  ° °SKILLED REHAB INSTRUCTIONS: °If the patient is transferred to a skilled rehab facility following release from the hospital, a list of the current medications will be sent to the facility for the patient to continue.  When discharged from the skilled rehab facility, please have the facility set up the patient's Home Health Physical Therapy prior to being released. Also, the skilled facility will be responsible for providing the patient with their medications at time of release from the facility to include their pain medication and their blood thinner medication. If the patient is still at the rehab facility at time of the two week follow up appointment, the skilled rehab facility will also need to assist the patient in arranging follow up appointment in our office and any transportation needs. ° °MAKE SURE YOU:  °Understand these instructions.  °Will watch your condition.  °Will get help right away if you are not doing well or get worse. ° °Pick up stool softner and  laxative for home use following surgery while on pain medications. °Do not remove your dressing. °The dressing is waterproof--it is OK to take showers. °Continue to use ice for pain and swelling after surgery. °Do not use any lotions or creams on the incision until instructed by your surgeon. °Total Hip Protocol. ° ° °

## 2016-03-04 ENCOUNTER — Encounter (HOSPITAL_COMMUNITY): Payer: Self-pay | Admitting: Orthopedic Surgery

## 2016-03-04 LAB — CBC
HCT: 29.5 % — ABNORMAL LOW (ref 39.0–52.0)
Hemoglobin: 9.8 g/dL — ABNORMAL LOW (ref 13.0–17.0)
MCH: 26.4 pg (ref 26.0–34.0)
MCHC: 33.2 g/dL (ref 30.0–36.0)
MCV: 79.5 fL (ref 78.0–100.0)
PLATELETS: 170 10*3/uL (ref 150–400)
RBC: 3.71 MIL/uL — ABNORMAL LOW (ref 4.22–5.81)
RDW: 13.1 % (ref 11.5–15.5)
WBC: 11 10*3/uL — ABNORMAL HIGH (ref 4.0–10.5)

## 2016-03-04 LAB — BASIC METABOLIC PANEL
Anion gap: 7 (ref 5–15)
BUN: 14 mg/dL (ref 6–20)
CALCIUM: 7.9 mg/dL — AB (ref 8.9–10.3)
CO2: 24 mmol/L (ref 22–32)
CREATININE: 1.13 mg/dL (ref 0.61–1.24)
Chloride: 103 mmol/L (ref 101–111)
GFR calc Af Amer: >60 mL/min (ref >60)
Glucose, Bld: 252 mg/dL — ABNORMAL HIGH (ref 65–99)
Potassium: 4.3 mmol/L (ref 3.5–5.1)
SODIUM: 134 mmol/L — AB (ref 135–145)

## 2016-03-04 MED ORDER — SENNA 8.6 MG PO TABS: 2.0000 | ORAL_TABLET | Freq: Every day | ORAL | 0 refills | Status: DC

## 2016-03-04 MED ORDER — HYDROCODONE-ACETAMINOPHEN 5-325 MG PO TABS: 1.0000 | ORAL_TABLET | ORAL | 0 refills | Status: DC | PRN

## 2016-03-04 MED ORDER — ASPIRIN EC 325 MG PO TBEC: 325.0000 mg | DELAYED_RELEASE_TABLET | Freq: Two times a day (BID) | ORAL | 1 refills | Status: DC

## 2016-03-04 MED ORDER — DOCUSATE SODIUM 100 MG PO CAPS: 100.0000 mg | ORAL_CAPSULE | Freq: Two times a day (BID) | ORAL | 0 refills | Status: DC

## 2016-03-04 MED ORDER — ONDANSETRON HCL 4 MG PO TABS: 4.0000 mg | ORAL_TABLET | Freq: Four times a day (QID) | ORAL | 0 refills | Status: DC | PRN

## 2016-03-04 MED ORDER — METHOCARBAMOL 500 MG PO TABS: 500.0000 mg | ORAL_TABLET | Freq: Four times a day (QID) | ORAL | 0 refills | Status: DC | PRN

## 2016-03-04 NOTE — Care Management Note (Signed)
Case Management Note  Patient Details  Name: Jonathan Riley MRN: [id number] Date of Birth: Jan 14, 1961  Subjective/Objective:   55 yr old male s/p right total hip arthroplasty.                 Action/Plan: Patient was preoperatively setup with [facility], no changes. Patient has rolling walker, CM will order 3in1. Will have family support at discharge.    Expected Discharge Date:  03/04/16             Expected Discharge Plan:  Home w Home Health Services  In-House Referral:     Discharge planning Services  CM Consult  Post Acute Care Choice:  Durable Medical Equipment, Home Health Choice offered to:  Patient  DME Arranged:  3-N-1 DME Agency:  [facility]  HH Arranged:  PT [facility]  Status of Service:  Completed, signed off  If discussed at Long Length of Stay Meetings, dates discussed:    Additional Comments:  Durenda GuthrieBrady, Georgette Helmer Naomi, RN 03/04/2016, 2:49 PM

## 2016-03-04 NOTE — Discharge Summary (Signed)
Physician Discharge Summary  Patient ID: Jonathan Riley MRN: [id number] DOB/AGE: Nov 29, 1960 55 y.o.  Admit date: 03/03/2016 Discharge date: 03/04/2016  Admission Diagnoses:  Primary osteoarthritis of right hip  Discharge Diagnoses:  Principal Problem:   Primary osteoarthritis of right hip   Past Medical History:  Diagnosis Date  . Arthritis   . Depression   . Dyspnea    "with exertion"  . Hypertension   . Nocturia    1-2 times during night  . PONV (postoperative nausea and vomiting)    "nausea after a knee surgery"    Surgeries: Procedure(s): TOTAL HIP ARTHROPLASTY ANTERIOR APPROACH on 03/03/2016   Consultants (if any):   Discharged Condition: Improved  Hospital Course: Jonathan Riley is an 55 y.o. male who was admitted 03/03/2016 with a diagnosis of Primary osteoarthritis of right hip and went to the operating room on 03/03/2016 and underwent the above named procedures.    He was given perioperative antibiotics:  Anti-infectives    Start     Dose/Rate Route Frequency Ordered Stop   03/03/16 2200  ceFAZolin (ANCEF) IVPB 2g/100 mL premix     2 g 200 mL/hr over 30 Minutes Intravenous Every 6 hours 03/03/16 2100 03/04/16 0359   03/03/16 0600  ceFAZolin (ANCEF) 3 g in dextrose 5 % 50 mL IVPB  Status:  Discontinued     3 g 130 mL/hr over 30 Minutes Intravenous On call to O.R. 03/02/16 1656 03/02/16 2333   03/03/16 0600  ceFAZolin (ANCEF) IVPB 2g/100 mL premix     2 g 200 mL/hr over 30 Minutes Intravenous  Once 03/02/16 2334 03/03/16 1453    .  He was given sequential compression devices, early ambulation, and ASA for DVT prophylaxis.  He benefited maximally from the hospital stay and there were no complications.    Recent vital signs:  Vitals:   03/04/16 0020 03/04/16 0500  BP: 124/68 117/77  Pulse: 86 88  Resp: 17 16  Temp: 99.3 F (37.4 C) 98.6 F (37 C)    Recent laboratory studies:  Lab Results  Component Value Date   HGB 9.8 (L) 03/04/2016    HGB 15.6 02/26/2016   Lab Results  Component Value Date   WBC 11.0 (H) 03/04/2016   PLT 170 03/04/2016   No results found for: INR Lab Results  Component Value Date   NA 134 (L) 03/04/2016   K 4.3 03/04/2016   CL 103 03/04/2016   CO2 24 03/04/2016   BUN 14 03/04/2016   CREATININE 1.13 03/04/2016   GLUCOSE 252 (H) 03/04/2016    Discharge Medications:     Medication List    TAKE these medications   ADVIL PM 200-38 MG Tabs Generic drug:  Ibuprofen-Diphenhydramine Cit Take 1-2 tablets by mouth at bedtime.   amLODipine 5 MG tablet Commonly known as:  NORVASC Take 5 mg by mouth daily.   aspirin EC 325 MG tablet Take 1-2 tablets (325-650 mg total) by mouth 2 (two) times daily with a meal. What changed:  when to take this   CIALIS 20 MG tablet Generic drug:  tadalafil Take 20 mg by mouth daily as needed for erectile dysfunction.   docusate sodium 100 MG capsule Commonly known as:  COLACE Take 1 capsule (100 mg total) by mouth 2 (two) times daily.   HYDROcodone-acetaminophen 5-325 MG tablet Commonly known as:  NORCO/VICODIN Take 1-2 tablets by mouth every 4 (four) hours as needed (breakthrough pain).   methocarbamol 500 MG tablet Commonly known as:  ROBAXIN Take 1 tablet (500 mg total) by mouth every 6 (six) hours as needed for muscle spasms.   ondansetron 4 MG tablet Commonly known as:  ZOFRAN Take 1 tablet (4 mg total) by mouth every 6 (six) hours as needed for nausea.   senna 8.6 MG Tabs tablet Commonly known as:  SENOKOT Take 2 tablets (17.2 mg total) by mouth at bedtime.   valsartan 160 MG tablet Commonly known as:  DIOVAN Take 160 mg by mouth daily.            Durable Medical Equipment        Start     Ordered   03/03/16 2101  DME Walker rolling  Once     03/03/16 2100   03/03/16 2101  DME Bedside commode  Once     03/03/16 2100   03/03/16 2101  DME 3 n 1  Once     03/03/16 2100      Diagnostic Studies: Dg Pelvis Portable  Result Date:  03/03/2016 CLINICAL DATA:  Status post right hip total arthroplasty. Initial encounter. EXAM: PORTABLE PELVIS 1-2 VIEWS COMPARISON:  None. FINDINGS: The patient's right hip total arthroplasty is grossly unremarkable in appearance, with associated cerclage wire. There is no evidence of loosening or new fracture. The left hip joint is unremarkable. The sacroiliac joints within normal limits. The visualized bowel gas pattern is grossly unremarkable. Overlying postoperative soft tissue air is noted at the right hip. IMPRESSION: Right hip total arthroplasty is unremarkable in appearance, with associated cerclage wire. No evidence of loosening or new fracture. Electronically Signed   By: Roanna RaiderJeffery  Chang M.D.   On: 03/03/2016 20:11   Dg C-arm Gt 120 Min  Result Date: 03/03/2016 CLINICAL DATA:  Right total hip arthroplasty, anterior approach EXAM: DG C-ARM GT 120 MIN; OPERATIVE RIGHT HIP WITH PELVIS FLUOROSCOPY TIME:  Fluoroscopy Time:  0 minutes 51 seconds Number of Acquired Spot Images: 4 COMPARISON:  None. FINDINGS: Four nondiagnostic spot fluoroscopic intraoperative right hip radiographs demonstrate postsurgical changes from right total hip arthroplasty with cerclage wire in the proximal right femoral metaphysis. IMPRESSION: Intraoperative fluoroscopic guidance for right total hip arthroplasty. Electronically Signed   By: Delbert PhenixJason A Poff M.D.   On: 03/03/2016 18:12   Dg Hip Operative Unilat W Or W/o Pelvis Right  Result Date: 03/03/2016 CLINICAL DATA:  Right total hip arthroplasty, anterior approach EXAM: DG C-ARM GT 120 MIN; OPERATIVE RIGHT HIP WITH PELVIS FLUOROSCOPY TIME:  Fluoroscopy Time:  0 minutes 51 seconds Number of Acquired Spot Images: 4 COMPARISON:  None. FINDINGS: Four nondiagnostic spot fluoroscopic intraoperative right hip radiographs demonstrate postsurgical changes from right total hip arthroplasty with cerclage wire in the proximal right femoral metaphysis. IMPRESSION: Intraoperative  fluoroscopic guidance for right total hip arthroplasty. Electronically Signed   By: Delbert PhenixJason A Poff M.D.   On: 03/03/2016 18:12    Disposition:   Discharge Instructions    Call MD / Call 911    Complete by:  As directed    If you experience chest pain or shortness of breath, CALL 911 and be transported to the hospital emergency room.  If you develope a fever above 101 F, pus (white drainage) or increased drainage or redness at the wound, or calf pain, call your surgeon's office.   Constipation Prevention    Complete by:  As directed    Drink plenty of fluids.  Prune juice may be helpful.  You may use a stool softener, such as Colace (over the counter) 100 mg  twice a day.  Use MiraLax (over the counter) for constipation as needed.   Diet - low sodium heart healthy    Complete by:  As directed    Driving restrictions    Complete by:  As directed    No driving for 6 weeks   Increase activity slowly as tolerated    Complete by:  As directed    Lifting restrictions    Complete by:  As directed    No lifting for 6 weeks   TED hose    Complete by:  As directed    Use stockings (TED hose) for 2 weeks on both leg(s).  You may remove them at night for sleeping.      Follow-up Information    Ashey Tramontana, Cloyde Reams, MD. Schedule an appointment as soon as possible for a visit in 2 weeks.   Specialty:  Orthopedic Surgery Why:  For wound re-check Contact information: [address] [place] [address] [phone]            Signed: Perle, Gibbon 03/04/2016, 7:52 AM

## 2016-03-04 NOTE — Evaluation (Signed)
Occupational Therapy Evaluation and Discharge Summary Patient Details Name: Rolene ArbourJames A Beougher MRN: [id number] DOB: 05-10-1960 Today's Date: 03/04/2016    History of Present Illness Pt is a 55 yo male admitted for a R THA with anterior approach.  Pt has had several past knee surgerys.     Clinical Impression   Pt admitted with the above diagnosis and overall is doing very well with adls only requiring physical assist to donn R sock and shoe.  Pt mobilizing well and has all necessary equipment. Wife is returning to work tomorrow so pt will be alone during the day but she is close by.  No OT needs at this time.    Follow Up Recommendations  No OT follow up;Supervision - Intermittent    Equipment Recommendations  3 in 1 bedside comode;Other (comment) (can get from mother)    Recommendations for Other Services       Precautions / Restrictions Restrictions Weight Bearing Restrictions: Yes RLE Weight Bearing: Weight bearing as tolerated      Mobility Bed Mobility Overal bed mobility: Modified Independent             General bed mobility comments: Pt required no outside assist to get from supine to sit without use of bedrails and bed was flat.  Transfers Overall transfer level: Needs assistance Equipment used: Rolling walker (2 wheeled) Transfers: Sit to/from Stand Sit to Stand: Supervision         General transfer comment: Cues for hand placement    Balance Overall balance assessment: Needs assistance Sitting-balance support: Feet supported Sitting balance-Leahy Scale: Good     Standing balance support: Bilateral upper extremity supported;During functional activity Standing balance-Leahy Scale: Fair Standing balance comment: Pt able to let go of walker while grooming at sink and pulling up pants at toilet with no LOB.                            ADL Overall ADL's : Needs assistance/impaired Eating/Feeding: Independent;Sitting   Grooming:  Supervision/safety;Standing   Upper Body Bathing: Set up;Sitting   Lower Body Bathing: Minimal assistance;Sit to/from stand Lower Body Bathing Details (indicate cue type and reason): min assist to wash R foot only Upper Body Dressing : Set up;Sitting   Lower Body Dressing: Minimal assistance;Sit to/from stand Lower Body Dressing Details (indicate cue type and reason): min assist for R sock and shoe only Toilet Transfer: Supervision/safety;RW;Ambulation;Cueing for safety Toilet Transfer Details (indicate cue type and reason): Pt ambulated to bathroom and required no hands on assist just cues for hand placement and safety. Toileting- Clothing Manipulation and Hygiene: Supervision/safety;Cueing for safety;Sit to/from stand     Tub/Shower Transfer Details (indicate cue type and reason): discussed tub shower transfer techniques. Functional mobility during ADLs: Supervision/safety;Cueing for sequencing;Rolling walker General ADL Comments: Pt did well with basic adls only requiring assist for R LE.     Vision Vision Assessment?: No apparent visual deficits   Perception     Praxis      Pertinent Vitals/Pain Pain Assessment: No/denies pain     Hand Dominance Right   Extremity/Trunk Assessment Upper Extremity Assessment Upper Extremity Assessment: Overall WFL for tasks assessed   Lower Extremity Assessment Lower Extremity Assessment: Defer to PT evaluation   Cervical / Trunk Assessment Cervical / Trunk Assessment: Normal   Communication Communication Communication: No difficulties   Cognition Arousal/Alertness: Awake/alert Behavior During Therapy: WFL for tasks assessed/performed Overall Cognitive Status: Within Functional Limits for tasks  assessed                     General Comments       Exercises       Shoulder Instructions      Home Living Family/patient expects to be discharged to:: Private residence Living Arrangements: Spouse/significant  other Available Help at Discharge: Family;Available PRN/intermittently Type of Home: House Home Access: Stairs to enter Entrance Stairs-Number of Steps: 3 Entrance Stairs-Rails: Right Home Layout: Able to live on main level with bedroom/bathroom;Two level     Bathroom Shower/Tub: Tub/shower unit;Curtain Shower/tub characteristics: Engineer, building servicesCurtain Bathroom Toilet: Handicapped height     Home Equipment: Bedside commode;Other (comment) (can borrow 3:1 from mother)          Prior Functioning/Environment Level of Independence: Independent        Comments: 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        OT Problem List:     OT Treatment/Interventions:      OT Goals(Current goals can be found in the care plan section) Acute Rehab OT Goals Patient Stated Goal: to go home today OT Goal Formulation: All assessment and education complete, DC therapy  OT Frequency:     Barriers to D/C:            Co-evaluation              End of Session Equipment Utilized During Treatment: Rolling walker Nurse Communication: Mobility status  Activity Tolerance: Patient tolerated treatment well Patient left: in chair;with call bell/phone within reach;with family/visitor present   Time: [id number] OT Time Calculation (min): 27 min Charges:  OT General Charges $OT Visit: 1 Procedure OT Evaluation $OT Eval Low Complexity: 1 Procedure OT Treatments $Self Care/Home Management : 8-22 mins G-Codes:    Hope BuddsJones, Lashonna Rieke Anne 03/04/2016, 9:30 AM  [phone]

## 2016-03-04 NOTE — Progress Notes (Signed)
03/04/16 1423  PT Visit Information  Last PT Received On 03/04/16  Assistance Needed +1  History of Present Illness Pt is a 55 yo male admitted for a R THA with direct anterior approach.  Pt has had several past knee surgerys.    Subjective Data  Subjective Pt reports no increased soreness from morning PT session. Pt feels well enough to participate in second PT session  Patient Stated Goal to go home today  Precautions  Precautions None  Restrictions  Weight Bearing Restrictions Yes  RLE Weight Bearing WBAT  Pain Assessment  Pain Assessment 0-10  Pain Score 3  Pain Location R hip  Pain Descriptors / Indicators Aching  Pain Intervention(s) Monitored during session;Limited activity within patient's tolerance;Repositioned  Cognition  Arousal/Alertness Awake/alert  Behavior During Therapy WFL for tasks assessed/performed  Overall Cognitive Status Within Functional Limits for tasks assessed  Bed Mobility  Overal bed mobility Modified Independent  General bed mobility comments Pt required no outside assist to get from supine to sit without use of bedrails and bed was flat.  Transfers  Overall transfer level Needs assistance  Equipment used Rolling walker (2 wheeled)  Transfers Sit to/from Stand  Sit to Stand Supervision  General transfer comment Cues for hand placement  Ambulation/Gait  Ambulation/Gait assistance Supervision  Ambulation Distance (Feet) 250 Feet  Assistive device Rolling walker (2 wheeled)  Gait Pattern/deviations Step-through pattern;Antalgic ((decreased ability to separate hip from pelvis))  General Gait Details Moderately antalgic gait pattern, verbal cues for safe RW use, improvement in pattern with increased distance. Supervision for safety. Pt demonstrated increased antagic gait with increased fatigue.  Gait velocity decreased  Gait velocity interpretation Below normal speed for age/gender  Stairs Yes  Stairs assistance Supervision  Stair Management One  rail Right;Step to pattern;Sideways  Number of Stairs 2  General stair comments Pt able to navigate stairs safely with supervision  Balance  Overall balance assessment Needs assistance  Sitting-balance support Feet supported;No upper extremity supported  Sitting balance-Leahy Scale Good  Standing balance support No upper extremity supported  Standing balance-Leahy Scale Fair  Standing balance comment Pt able to let go of walker while texting on phone  Exercises  Exercises Total Joint  Total Joint Exercises  Long Arc Quad AROM;Right;10 reps;Seated  Knee Flexion AROM;Right;10 reps;Standing  PT - End of Session  Activity Tolerance Patient tolerated treatment well  Patient left with call bell/phone within reach;in bed;with family/visitor present  Nurse Communication Other (comment) (Pt's family is here and ready to pick him up)  PT - Assessment/Plan  PT Plan Current plan remains appropriate  Follow Up Recommendations Home health PT;Supervision for mobility/OOB  PT equipment 3in1 (PT)  PT Goal Progression  Progress towards PT goals Progressing toward goals  Acute Rehab PT Goals  PT Goal Formulation With patient  Time For Goal Achievement 03/11/16  Potential to Achieve Goals Good  PT Time Calculation  PT Start Time (ACUTE ONLY) 1403  PT Stop Time (ACUTE ONLY) 1419  PT Time Calculation (min) (ACUTE ONLY) 16 min  PT General Charges  $$ ACUTE PT VISIT 1 Procedure  PT Treatments  $Gait Training 8-22 mins   Pt tolerated ambulation in session today without c/o increased pain. Pt exhibited increased antalgic gait with onset of fatigue after ~150 ft of ambulation, but he was able to complete well above his activity level required for ambulation around his home. Pt demonstrated good understanding of stair training, and expressed understanding of use of RW with transfers and  ambulation with minimal verbal cues. Pt continues to be a good candidate for home health Pt.  Gaye PollackRebecca Connelly [place], SPT [phone]  [phone]

## 2016-03-04 NOTE — Progress Notes (Signed)
03/04/16 1348  PT Visit Information  Last PT Received On 03/04/16  Assistance Needed +1  History of Present Illness Pt is a 55 yo male admitted for a R THA with direct anterior approach.  Pt has had several past knee surgerys, HTN, dyspnea, and R wrist surgery.   Precautions  Precautions None  Restrictions  Weight Bearing Restrictions Yes  RLE Weight Bearing WBAT  Home Living  Family/patient expects to be discharged to: Private residence  Living Arrangements Spouse/significant other  Available Help at Discharge Family;Available PRN/intermittently  Type of Home House  Home Access Stairs to enter  Entrance Stairs-Number of Steps 3  Entrance Stairs-Rails Right  Home Layout Able to live on main level with bedroom/bathroom;Two level  Bathroom Shower/Tub Tub/shower unit;Curtain  FirefighterBathroom Toilet Handicapped height  Home Equipment BSC;Other (comment) (can borrow 3:1 from mother)  Prior Function  Level of Independence Independent  Comments 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  Communication  Communication No difficulties  Pain Assessment  Pain Assessment No/denies pain  Cognition  Arousal/Alertness Awake/alert  Behavior During Therapy WFL for tasks assessed/performed  Overall Cognitive Status Within Functional Limits for tasks assessed  Cervical / Trunk Assessment  Cervical / Trunk Assessment Normal  Bed Mobility  Overal bed mobility Modified Independent  General bed mobility comments Pt required no outside assist to get from supine to sit without use of bedrails and bed was flat.  Transfers  Overall transfer level Needs assistance  Equipment used Rolling walker (2 wheeled)  Sit to Stand Supervision  General transfer comment Cues for hand placement  Ambulation/Gait  Ambulation/Gait assistance Min guard  Ambulation Distance (Feet) 250 Feet  Assistive device Rolling walker (2 wheeled)  Gait Pattern/deviations  Step-through pattern;Antalgic (decreased ability to separate hip from pelvis)  General Gait Details Moderately antalgic gait pattern, verbal cues for safe RW use, improvement in pattern with increased distance. Min guard assist for safety  Gait velocity decreased  Gait velocity interpretation Below normal speed for age/gender  Balance  Overall balance assessment Needs assistance  Sitting-balance support Feet supported;No upper extremity supported  Sitting balance-Leahy Scale Good  Standing balance support Bilateral upper extremity supported;No upper extremity supported;Single extremity supported  Standing balance-Leahy Scale Fair  Exercises  Exercises Total Joint  Total Joint Exercises  Ankle Circles/Pumps AROM;Both;20 reps  Quad Sets AROM;Both;10 reps  Short Arc Quad AROM;Right;10 reps  Heel Slides AROM;Right;10 reps  Hip ABduction/ADduction AROM;Right;10 reps  Long Arc Quad AROM;Right;10 reps  Knee Flexion AROM;Right;10 reps;Standing  Marching in Standing AROM;Right;10 reps  Standing Hip Extension AROM;Right;10 reps  PT - End of Session  Activity Tolerance Patient tolerated treatment well  Patient left in chair;with call bell/phone within reach  Nurse Communication Mobility status  PT Recommendation  Follow Up Recommendations Home health PT;Supervision for mobility/OOB  PT equipment 3in1 (PT)  Acute Rehab PT Goals  Patient Stated Goal to go home today  PT Goal Formulation With patient  Time For Goal Achievement 03/11/16  Potential to Achieve Goals Good  PT Time Calculation  PT Start Time (ACUTE ONLY) 1102  PT Stop Time (ACUTE ONLY) 1135  PT Time Calculation (min) (ACUTE ONLY) 33 min  PT General Charges  $$ ACUTE PT VISIT 1 Procedure  PT Evaluation  $PT Eval Moderate Complexity 1 Procedure  PT Treatments  $Gait Training 8-22 mins  Written Expression  Dominant Hand Right   A: Pt is POD #1 and is mobilizing well with min  guard assist overall.  I anticipate he will  progress well enough to d/c home with HHPT and his wife's intermittent supervision at discharge.  PT will continue to follow acutely for deficits listed above.  Rollene Rotundaebecca B. Kalissa Grays, PT, DPT [phone]

## 2016-03-04 NOTE — Progress Notes (Signed)
   Subjective:  Patient reports pain as mild to moderate.  Denies N/V/CP/SOB.  Objective:   VITALS:   Vitals:   03/03/16 2045 03/03/16 2102 03/04/16 0020 03/04/16 0500  BP: 101/60 110/64 124/68 117/77  Pulse: 69 70 86 88  Resp: 18 17 17 16   Temp: 97.9 F (36.6 C) 98.7 F (37.1 C) 99.3 F (37.4 C) 98.6 F (37 C)  TempSrc:  Oral Oral Oral  SpO2: 100% 97% 96% 98%  Weight:        NAD Neurologically intact ABD soft Sensation intact distally Intact pulses distally Dorsiflexion/Plantar flexion intact Incision: dressing C/D/I Compartment soft    Lab Results  Component Value Date   WBC 11.0 (H) 03/04/2016   HGB 9.8 (L) 03/04/2016   HCT 29.5 (L) 03/04/2016   MCV 79.5 03/04/2016   PLT 170 03/04/2016   BMET    Component Value Date/Time   NA 134 (L) 03/04/2016 0400   K 4.3 03/04/2016 0400   CL 103 03/04/2016 0400   CO2 24 03/04/2016 0400   GLUCOSE 252 (H) 03/04/2016 0400   BUN 14 03/04/2016 0400   CREATININE 1.13 03/04/2016 0400   CALCIUM 7.9 (L) 03/04/2016 0400   GFRNONAA >60 03/04/2016 0400   GFRAA >60 03/04/2016 0400     Assessment/Plan: 1 Day Post-Op   Principal Problem:   Primary osteoarthritis of right hip   WBAT with walker DVT ppx: ASA, SCDs, TEDs PO pain control PT/OT Dispo: D/C home with HHPT   Travor Royce, Cloyde ReamsBrian Halim 03/04/2016, 7:49 AM   Samson FredericBrian Shar Paez, MD Cell [phone]

## 2016-03-04 NOTE — Progress Notes (Signed)
Reviewed discharge papers and medications, with full under standing, IV d/c, Pt states he has all his equipment

## 2016-03-06 ENCOUNTER — Emergency Department (HOSPITAL_BASED_OUTPATIENT_CLINIC_OR_DEPARTMENT_OTHER)
Admit: 2016-03-06 | Discharge: 2016-03-06 | Disposition: A | Discharge status: Home / Self Care | Payer: BLUE CROSS/BLUE SHIELD | Attending: Emergency Medicine | Admitting: Emergency Medicine

## 2016-03-06 ENCOUNTER — Encounter (HOSPITAL_COMMUNITY): Payer: Self-pay | Admitting: Emergency Medicine

## 2016-03-06 ENCOUNTER — Emergency Department (HOSPITAL_COMMUNITY)
Admission: EM | Admit: 2016-03-06 | Discharge: 2016-03-06 | Disposition: A | Discharge status: Home / Self Care | Payer: BLUE CROSS/BLUE SHIELD | Attending: Emergency Medicine | Admitting: Emergency Medicine

## 2016-03-06 ENCOUNTER — Emergency Department (HOSPITAL_COMMUNITY): Payer: BLUE CROSS/BLUE SHIELD

## 2016-03-06 DIAGNOSIS — I10 Essential (primary) hypertension: Secondary | ICD-10-CM | POA: Insufficient documentation

## 2016-03-06 DIAGNOSIS — Z87891 Personal history of nicotine dependence: Secondary | ICD-10-CM | POA: Insufficient documentation

## 2016-03-06 DIAGNOSIS — M7989 Other specified soft tissue disorders: Secondary | ICD-10-CM | POA: Diagnosis present

## 2016-03-06 DIAGNOSIS — Z7982 Long term (current) use of aspirin: Secondary | ICD-10-CM | POA: Insufficient documentation

## 2016-03-06 DIAGNOSIS — Z96641 Presence of right artificial hip joint: Secondary | ICD-10-CM | POA: Insufficient documentation

## 2016-03-06 DIAGNOSIS — M79609 Pain in unspecified limb: Secondary | ICD-10-CM

## 2016-03-06 LAB — CBC WITH DIFFERENTIAL/PLATELET
BASOS ABS: 0 10*3/uL (ref 0.0–0.1)
Basophils Relative: 0 %
EOS ABS: 0 10*3/uL (ref 0.0–0.7)
EOS PCT: 0 %
HCT: 25.1 % — ABNORMAL LOW (ref 39.0–52.0)
Hemoglobin: 8.2 g/dL — ABNORMAL LOW (ref 13.0–17.0)
Lymphocytes Relative: 25 %
Lymphs Abs: 2.1 10*3/uL (ref 0.7–4.0)
MCH: 26 pg (ref 26.0–34.0)
MCHC: 32.7 g/dL (ref 30.0–36.0)
MCV: 79.7 fL (ref 78.0–100.0)
Monocytes Absolute: 0.7 10*3/uL (ref 0.1–1.0)
Monocytes Relative: 9 %
NEUTROS PCT: 66 %
Neutro Abs: 5.3 10*3/uL (ref 1.7–7.7)
PLATELETS: 183 10*3/uL (ref 150–400)
RBC: 3.15 MIL/uL — AB (ref 4.22–5.81)
RDW: 13.7 % (ref 11.5–15.5)
WBC: 8.1 10*3/uL (ref 4.0–10.5)

## 2016-03-06 LAB — BASIC METABOLIC PANEL
ANION GAP: 7 (ref 5–15)
BUN: 13 mg/dL (ref 6–20)
CO2: 29 mmol/L (ref 22–32)
Calcium: 8.7 mg/dL — ABNORMAL LOW (ref 8.9–10.3)
Chloride: 103 mmol/L (ref 101–111)
Creatinine, Ser: 0.96 mg/dL (ref 0.61–1.24)
Glucose, Bld: 106 mg/dL — ABNORMAL HIGH (ref 65–99)
POTASSIUM: 4 mmol/L (ref 3.5–5.1)
SODIUM: 139 mmol/L (ref 135–145)

## 2016-03-06 MED ORDER — HYDROCODONE-ACETAMINOPHEN 5-325 MG PO TABS
ORAL_TABLET | ORAL | Status: AC
  Filled 2016-03-06: qty 1

## 2016-03-06 MED ORDER — HYDROCODONE-ACETAMINOPHEN 5-325 MG PO TABS
1.0000 | ORAL_TABLET | Freq: Once | ORAL | Status: AC
  Administered 2016-03-06: 1 via ORAL

## 2016-03-06 NOTE — ED Provider Notes (Signed)
[facility]-EMERGENCY DEPT Provider Note   CSN: [id number] Arrival date & time: 03/06/16  1802     History   Chief Complaint Chief Complaint  Patient presents with  . Hip Pain  . Leg Swelling    HPI Jonathan Riley is a 55 y.o. male.  The history is provided by the patient.  Hip Pain  This is a new problem. The current episode started 12 to 24 hours ago. The problem occurs constantly. The problem has been gradually worsening. Associated symptoms comments: Ongoing pain from right hip replacement, new redness and swelling of right thigh. Nothing aggravates the symptoms. Nothing relieves the symptoms. He has tried nothing for the symptoms.    Past Medical History:  Diagnosis Date  . Arthritis   . Depression   . Dyspnea    "with exertion"  . Hypertension   . Nocturia    1-2 times during night  . PONV (postoperative nausea and vomiting)    "nausea after a knee surgery"    Patient Active Problem List   Diagnosis Date Noted  . Primary osteoarthritis of right hip 03/03/2016  . Alteration of consciousness 02/21/2013    Past Surgical History:  Procedure Laterality Date  . COLONOSCOPY    . KNEE SURGERY Bilateral    Right, Left x2  . TOTAL HIP ARTHROPLASTY Right 03/03/2016   Procedure: TOTAL HIP ARTHROPLASTY ANTERIOR APPROACH;  Surgeon: Samson FredericBrian Swinteck, MD;  Location: [facility] OR;  Service: Orthopedics;  Laterality: Right;  . WRIST SURGERY Right 2008 or 2009       Home Medications    Prior to Admission medications   Medication Sig Start Date End Date Taking? Authorizing Provider  amLODipine (NORVASC) 5 MG tablet Take 5 mg by mouth daily. 01/31/16   Historical Provider, MD  aspirin EC 325 MG tablet Take 1-2 tablets (325-650 mg total) by mouth 2 (two) times daily with a meal. 03/04/16   Samson FredericBrian Swinteck, MD  CIALIS 20 MG tablet Take 20 mg by mouth daily as needed for erectile dysfunction.  02/12/13   Historical Provider, MD  docusate sodium (COLACE) 100 MG capsule Take 1 capsule  (100 mg total) by mouth 2 (two) times daily. 03/04/16   Samson FredericBrian Swinteck, MD  HYDROcodone-acetaminophen (NORCO/VICODIN) 5-325 MG tablet Take 1-2 tablets by mouth every 4 (four) hours as needed (breakthrough pain). 03/04/16   Samson FredericBrian Swinteck, MD  Ibuprofen-Diphenhydramine Cit (ADVIL PM) 200-38 MG TABS Take 1-2 tablets by mouth at bedtime.    Historical Provider, MD  methocarbamol (ROBAXIN) 500 MG tablet Take 1 tablet (500 mg total) by mouth every 6 (six) hours as needed for muscle spasms. 03/04/16   Samson FredericBrian Swinteck, MD  ondansetron (ZOFRAN) 4 MG tablet Take 1 tablet (4 mg total) by mouth every 6 (six) hours as needed for nausea. 03/04/16   Samson FredericBrian Swinteck, MD  senna (SENOKOT) 8.6 MG TABS tablet Take 2 tablets (17.2 mg total) by mouth at bedtime. 03/04/16   Samson FredericBrian Swinteck, MD  valsartan (DIOVAN) 160 MG tablet Take 160 mg by mouth daily. 02/11/16   Historical Provider, MD    Family History Family History  Problem Relation Age of Onset  . Heart disease    . Diabetes    . Cancer      Social History Social History  Substance Use Topics  . Smoking status: Former Smoker    Types: Cigarettes  . Smokeless tobacco: Never Used     Comment: "quit about 12 years ago" 02/26/2016  . Alcohol use No  Allergies   Percocet [oxycodone-acetaminophen]   Review of Systems Review of Systems  All other systems reviewed and are negative.    Physical Exam Updated Vital Signs BP 144/78 (BP Location: Right Arm)   Pulse 94   Temp 98.2 F (36.8 C) (Oral)   Resp 25   SpO2 98%   Physical Exam  Constitutional: He is oriented to person, place, and time. He appears well-developed and well-nourished. No distress.  HENT:  Head: Normocephalic and atraumatic.  Nose: Nose normal.  Eyes: Conjunctivae are normal.  Neck: Neck supple. No tracheal deviation present.  Cardiovascular: Normal rate and regular rhythm.   Pulmonary/Chest: Effort normal. No respiratory distress.  Abdominal: Soft. He exhibits no  distension.  Musculoskeletal:       Right upper leg: He exhibits tenderness and swelling. He exhibits no deformity.  Incision is in tact without focal swelling, drainage, or erythema  Neurological: He is alert and oriented to person, place, and time.  Skin: Skin is warm and dry.  Psychiatric: He has a normal mood and affect.     ED Treatments / Results  Labs (all labs ordered are listed, but only abnormal results are displayed) Labs Reviewed  CBC WITH DIFFERENTIAL/PLATELET - Abnormal; Notable for the following:       Result Value   RBC 3.15 (*)    Hemoglobin 8.2 (*)    HCT 25.1 (*)    All other components within normal limits  BASIC METABOLIC PANEL - Abnormal; Notable for the following:    Glucose, Bld 106 (*)    Calcium 8.7 (*)    All other components within normal limits    EKG  EKG Interpretation None       Radiology Dg Hip Unilat W Or Wo Pelvis 2-3 Views Right  Result Date: 03/06/2016 CLINICAL DATA:  Increased right hip pain, swelling and redness post right hip arthroplasty 4 days prior. EXAM: DG HIP (WITH OR WITHOUT PELVIS) 2-3V RIGHT COMPARISON:  Postoperative radiograph 03/03/2016 FINDINGS: Right hip arthroplasty with cerclage wire fixation in expected and unchanged alignment. There are no periprosthetic lucencies or fracture. No bony destructive change. Diminished postsurgical air in the soft tissues and joint with possible minimal residual air bubble laterally at the level of the femoral neck of the femoral component, an expected postoperative appearance. No new or tracking soft tissue air. IMPRESSION: Post recent right hip arthroplasty without evidence of acute abnormality. Electronically Signed   By: Rubye OaksMelanie  Ehinger M.D.   On: 03/06/2016 20:36    Procedures Procedures (including critical care time)  Medications Ordered in ED Medications  HYDROcodone-acetaminophen (NORCO/VICODIN) 5-325 MG per tablet (not administered)  HYDROcodone-acetaminophen (NORCO/VICODIN)  5-325 MG per tablet 1 tablet (1 tablet Oral Given 03/06/16 1821)     Initial Impression / Assessment and Plan / ED Course  I have reviewed the triage vital signs and the nursing notes.  Pertinent labs & imaging results that were available during my care of the patient were reviewed by me and considered in my medical decision making (see chart for details).  Clinical Course     55 year old male who is 3 days status post right hip replacement presents with right thigh redness and swelling that started today and has progressively worsened. Differential diagnosis considerations include postoperative swelling, infection, DVT.  The incision site looks clean and dry and intact with no fluctuance or extension of redness into the hip or knee. DVT study is negative for acute thrombus. Plain films show no evidence of operative complication.  Dr. Linna Caprice was available for bedside evaluation in the ED and recommended compression therapy to help with symptoms.  Hemoglobin is down to 8.2 from 9.8 but patient with no active signs of anemia and does not meet threshold for transfusion currently. Patient has spica wrap in place performed by Dr. Linna Caprice and he recommended follow-up in clinic early next week for a reevaluation. Return precautions were discussed for worsening or new concerning symptoms including signs of potential worsening anemia.  Final Clinical Impressions(s) / ED Diagnoses   Final diagnoses:  Right leg swelling    New Prescriptions New Prescriptions   No medications on file     Lyndal Pulley, MD 03/06/16 2218

## 2016-03-06 NOTE — ED Triage Notes (Signed)
Pt sts right groin and leg pain with swelling; pt had right hip replacement on Monday

## 2016-03-06 NOTE — Progress Notes (Signed)
*  Preliminary Results* Right lower extremity venous duplex completed. Right lower extremity is negative for deep vein thrombosis. There is no evidence of right Baker's cyst.  03/06/2016 8:44 PM  Gertie FeyMichelle Conchetta Lamia, BS, RVT, RDCS, RDMS

## 2016-12-16 IMAGING — CR DG PORTABLE PELVIS
2 series · 2 of 2 positions shown · non-contrast
Comparison: None.

CLINICAL DATA: Status post right hip total arthroplasty. Initial
encounter.

EXAM:
PORTABLE PELVIS 1-2 VIEWS

[AP (1 of 2)]
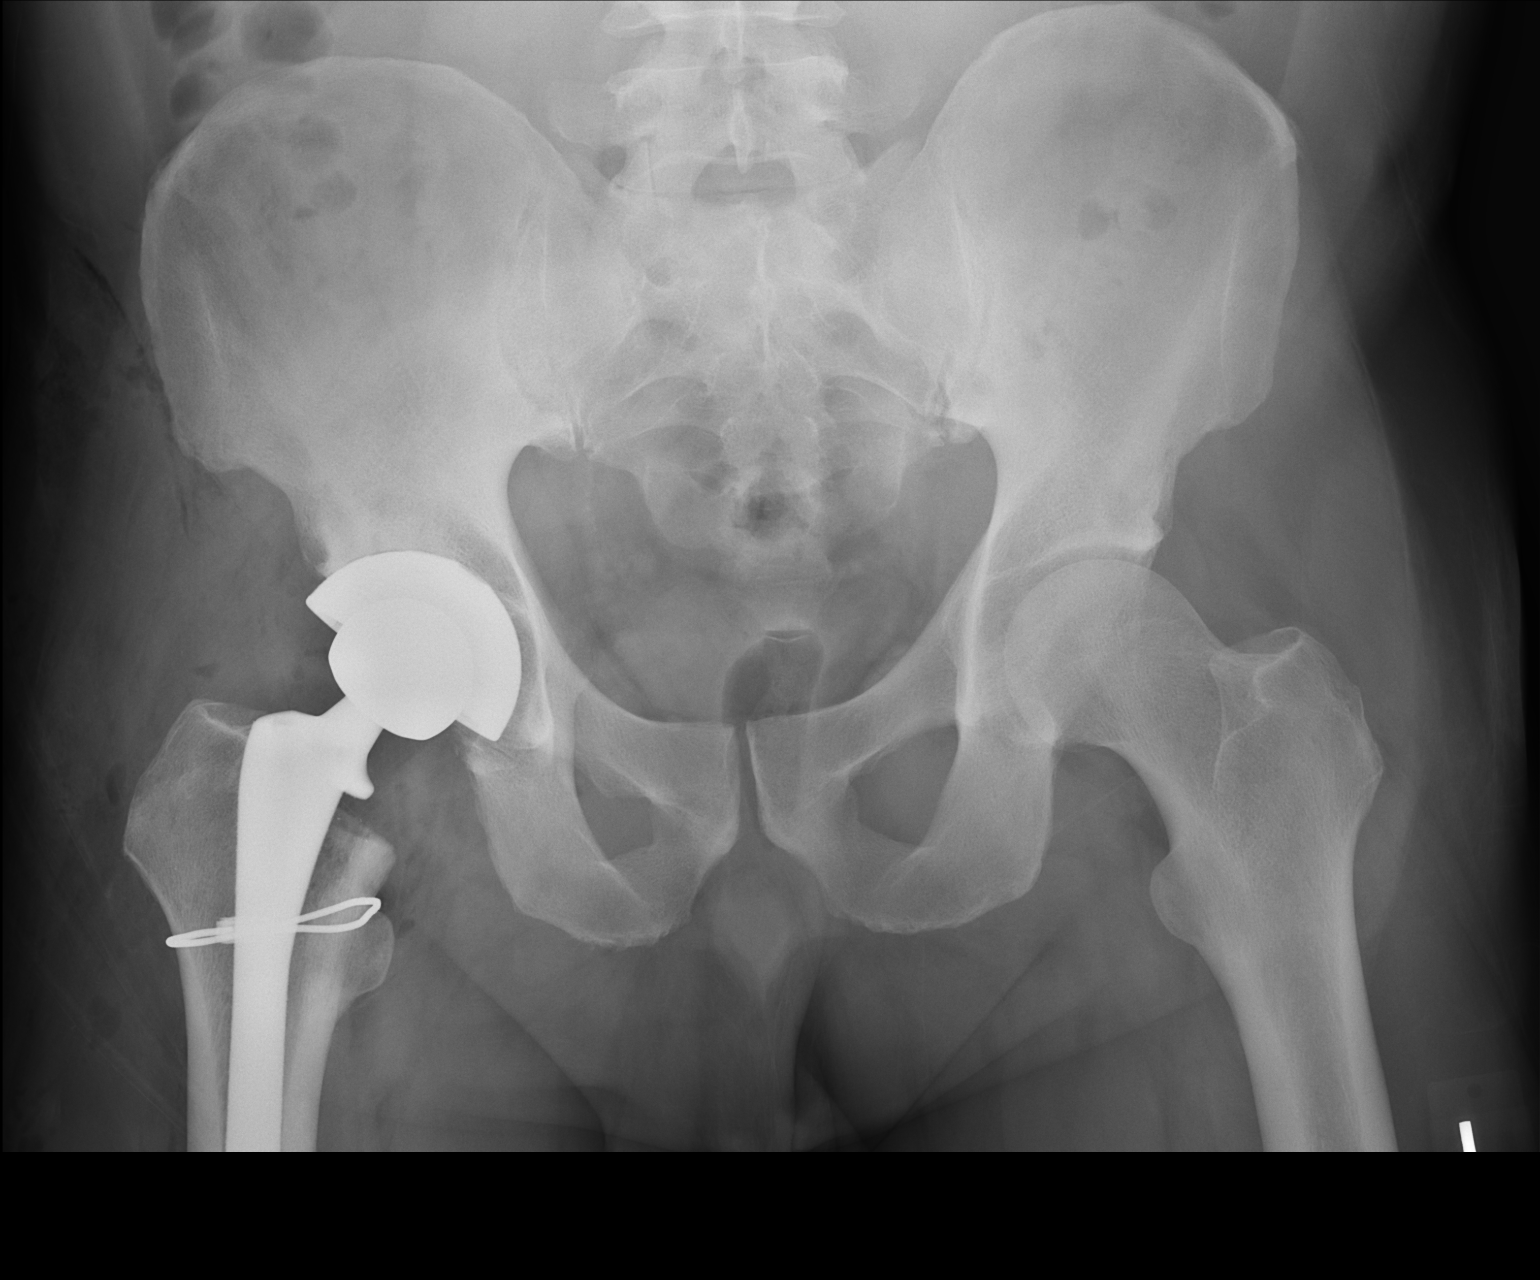

[AP (2 of 2)]
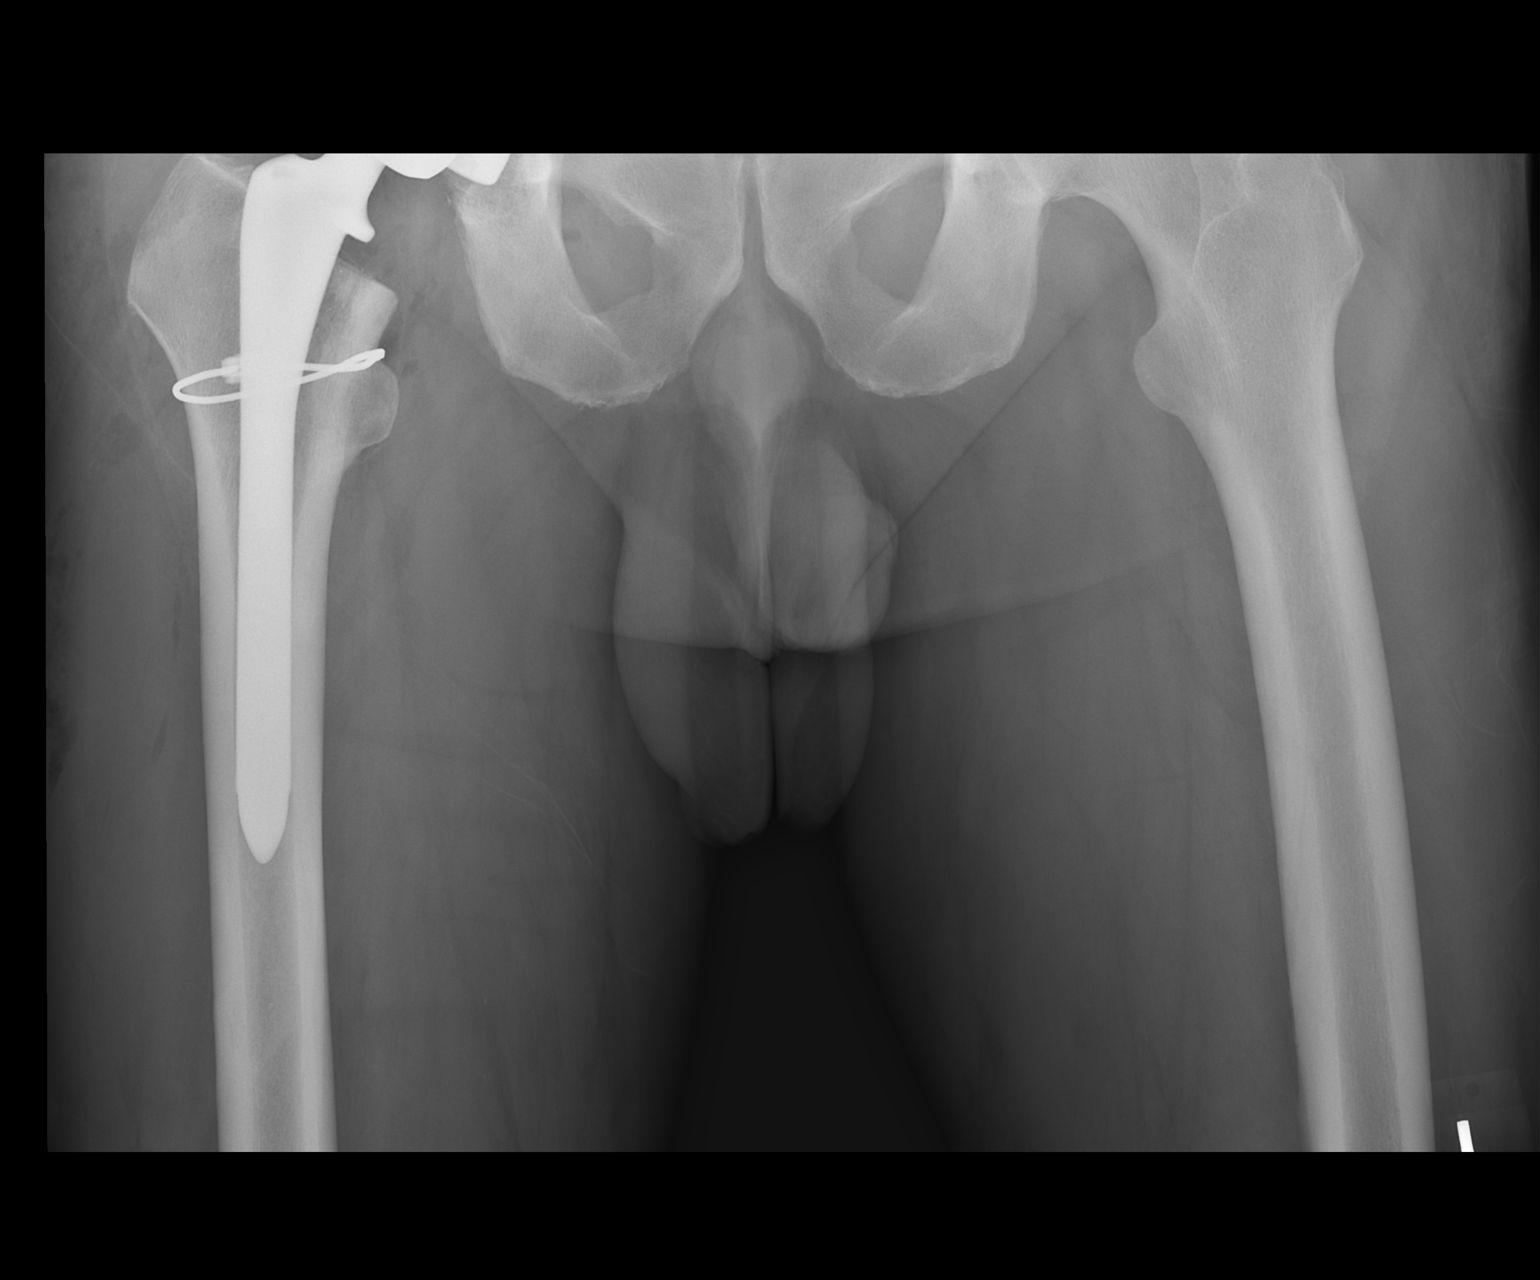

[2 of 2 positions shown; findings below may reference images not displayed]

FINDINGS: The patient's right hip total arthroplasty is grossly unremarkable
in appearance, with associated cerclage wire. There is no evidence
of loosening or new fracture.

The left hip joint is unremarkable. The sacroiliac joints within
normal limits. The visualized bowel gas pattern is grossly
unremarkable. Overlying postoperative soft tissue air is noted at
the right hip.
IMPRESSION: Right hip total arthroplasty is unremarkable in appearance, with
associated cerclage wire. No evidence of loosening or new fracture.

## 2016-12-16 IMAGING — RF DG HIP (WITH PELVIS) OPERATIVE*R*
1 series · 4 of 4 positions shown · non-contrast
Comparison: None.

CLINICAL DATA: Right total hip arthroplasty, anterior approach

EXAM:
DG C-ARM GT 120 MIN; OPERATIVE RIGHT HIP WITH PELVIS
FLUOROSCOPY TIME:  Fluoroscopy Time:  0 minutes 51 seconds
Number of Acquired Spot Images: 4

[Series 1: run · 4 of 4 slices shown]
[im 1/4]
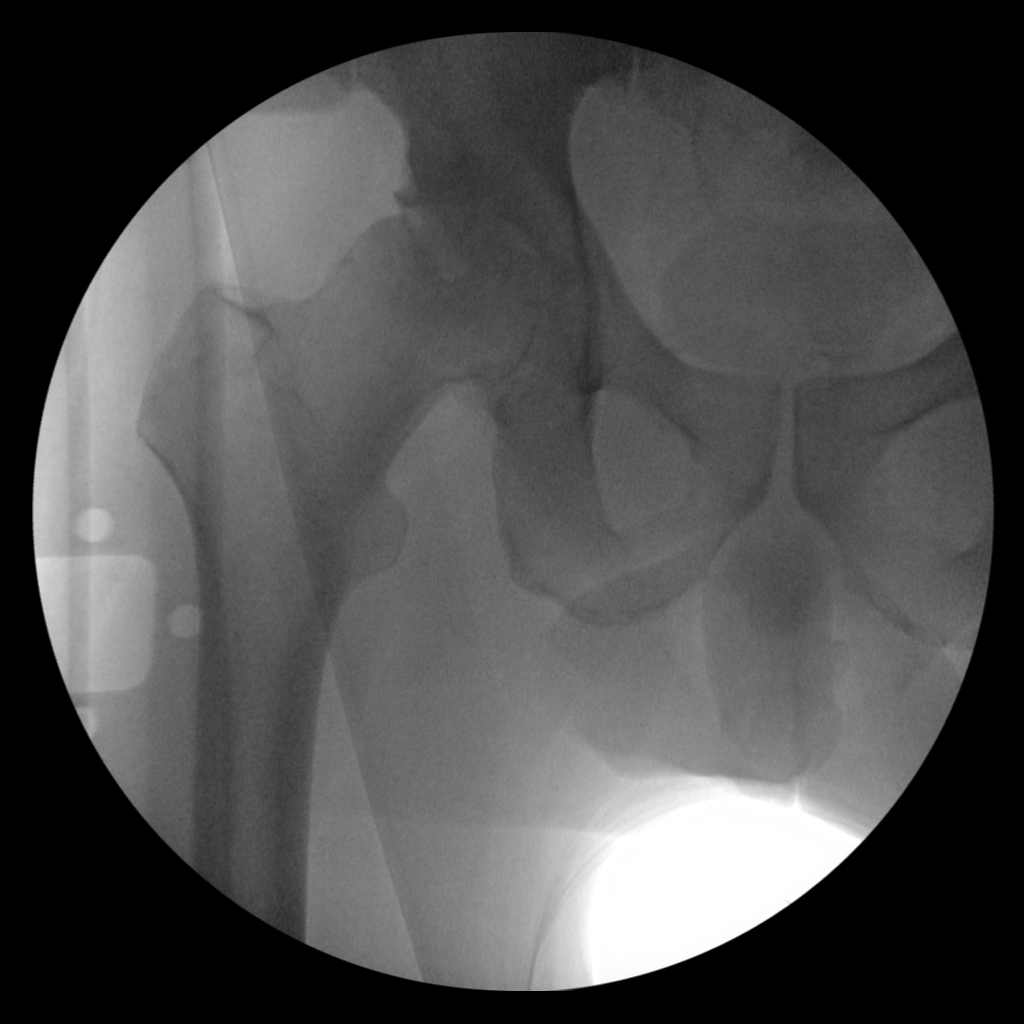
[im 2/4]
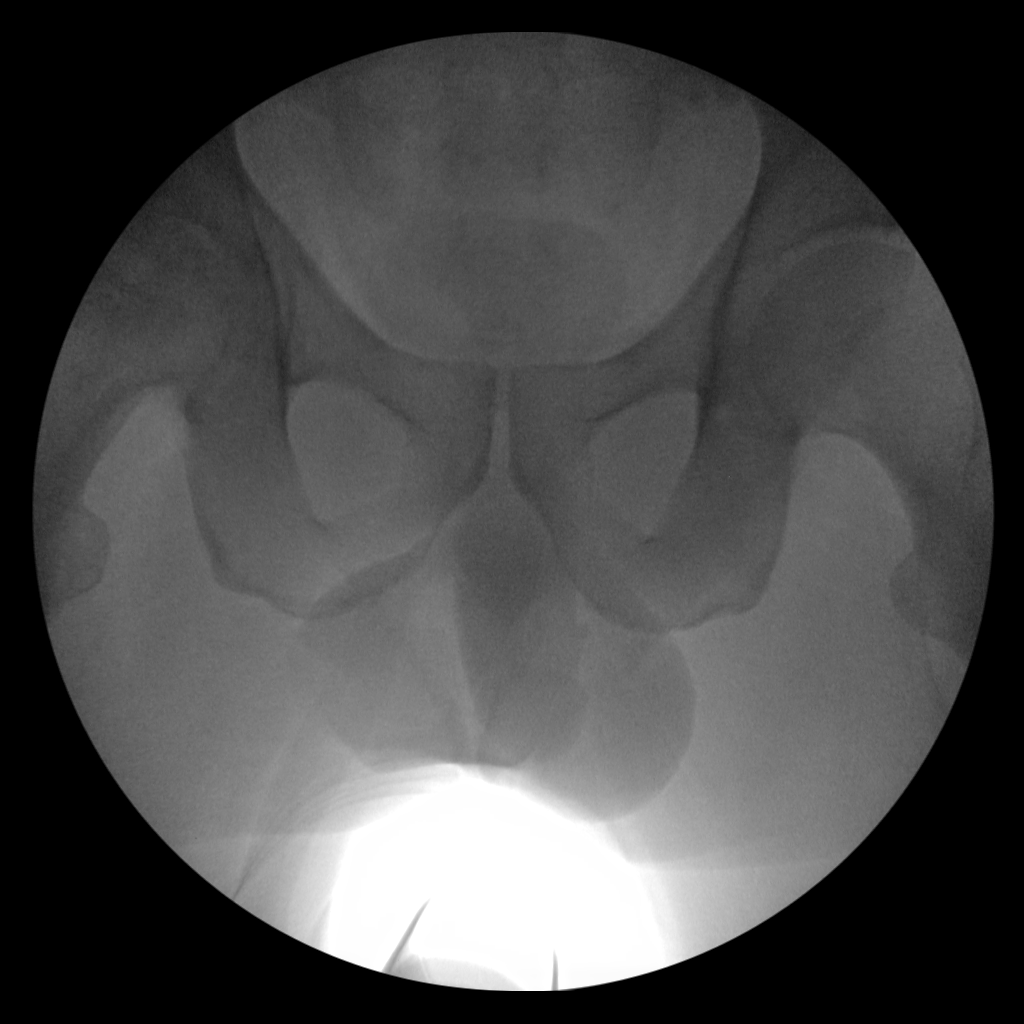
[im 3/4]
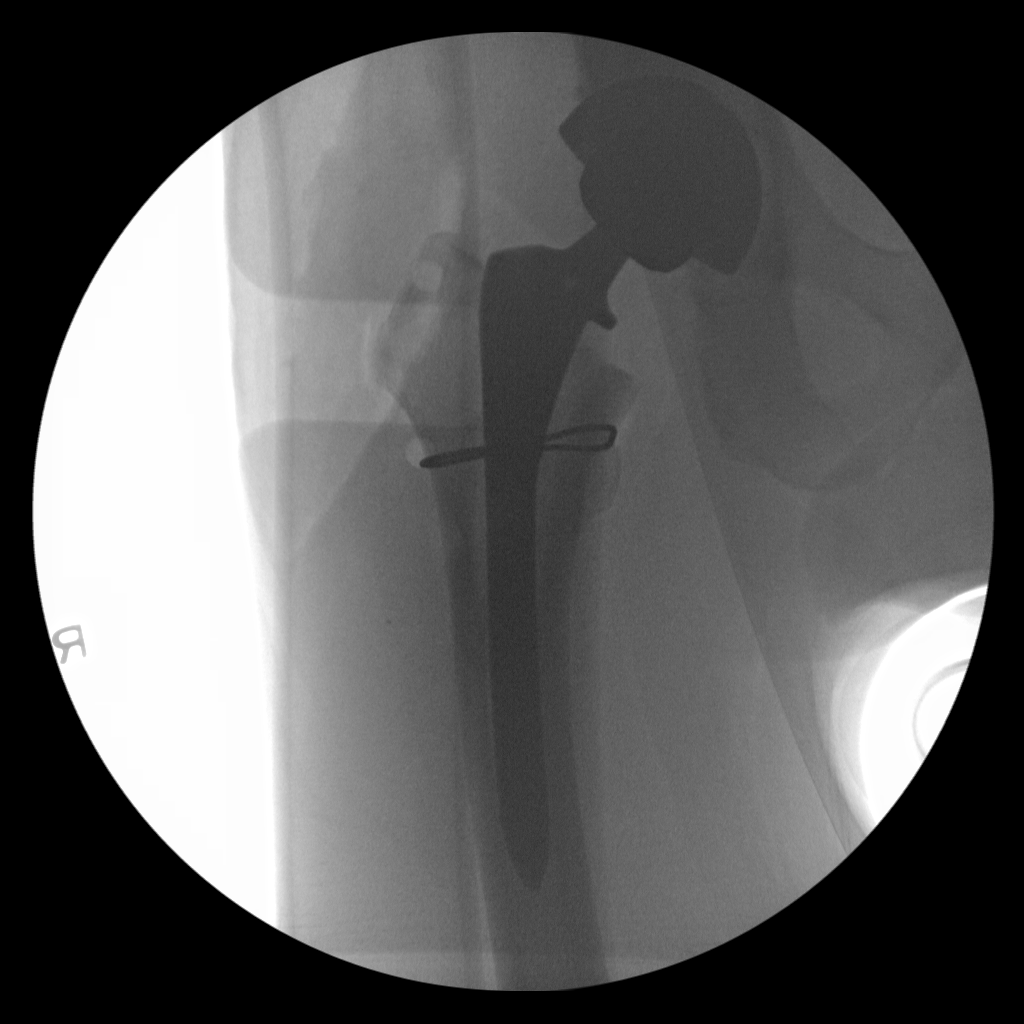
[im 4/4]
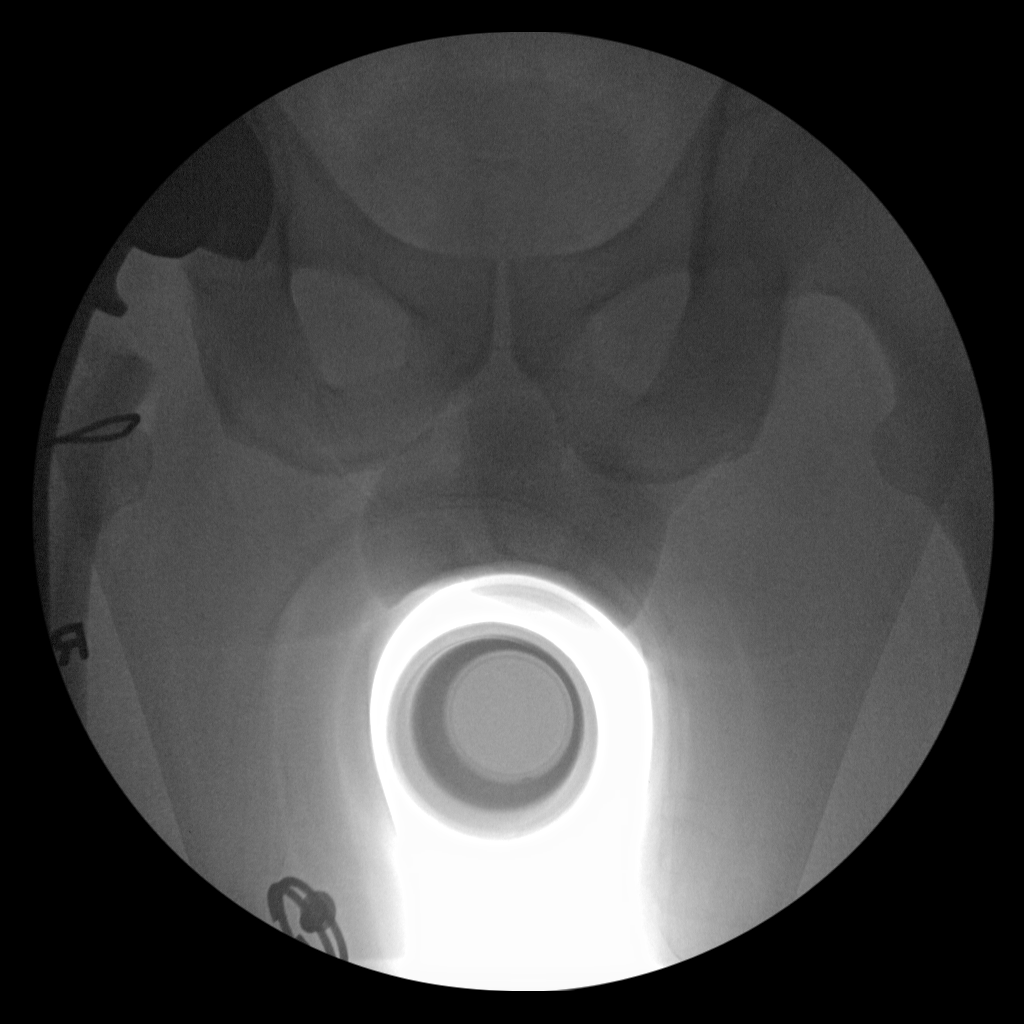

[4 of 4 positions shown; findings below may reference images not displayed]

FINDINGS: Four nondiagnostic spot fluoroscopic intraoperative right hip
radiographs demonstrate postsurgical changes from right total hip
arthroplasty with cerclage wire in the proximal right femoral
metaphysis.
IMPRESSION: Intraoperative fluoroscopic guidance for right total hip
arthroplasty.

## 2017-02-12 ENCOUNTER — Encounter: Payer: BLUE CROSS/BLUE SHIELD | Attending: Family Medicine | Admitting: Skilled Nursing Facility1

## 2017-02-12 ENCOUNTER — Encounter: Payer: Self-pay | Admitting: Skilled Nursing Facility1

## 2017-02-12 DIAGNOSIS — Z713 Dietary counseling and surveillance: Secondary | ICD-10-CM | POA: Diagnosis present

## 2017-02-12 DIAGNOSIS — E119 Type 2 diabetes mellitus without complications: Secondary | ICD-10-CM

## 2017-02-12 NOTE — Progress Notes (Signed)
Diabetes Self-Management Education  Visit Type: First/Initial  02/12/2017  [name], identified by name and date of birth, is a 56 y.o. male with a diagnosis of Diabetes: Type 2.   ASSESSMENT  There were no vitals taken for this visit. There is no height or weight on file to calculate BMI.  Pt states he feels really weak, start shaking and feeling like he is going to pass out a couple times a month usually after not having eaten. Pt states he has had a hip replacement and gained 30 pounds after that.  Pt was given contour next lot: [id number] exp: 05/21/2018: tested in appointment: 124, 2 hours after eating 2 doughnuts.       Diabetes Self-Management Education - 02/12/17 0900      Visit Information   Visit Type First/Initial     Initial Visit   Diabetes Type Type 2   Are you currently following a meal plan? No   Are you taking your medications as prescribed? Not on Medications   Date Diagnosed A few months ago     Health Coping   How would you rate your overall health? Fair     Psychosocial Assessment   Patient Belief/Attitude about Diabetes Motivated to manage diabetes     Pre-Education Assessment   Patient understands the diabetes disease and treatment process. Needs Instruction   Patient understands incorporating nutritional management into lifestyle. Needs Instruction   Patient undertands incorporating physical activity into lifestyle. Needs Instruction   Patient understands using medications safely. Needs Instruction   Patient understands monitoring blood glucose, interpreting and using results Needs Instruction   Patient understands prevention, detection, and treatment of acute complications. Needs Instruction   Patient understands prevention, detection, and treatment of chronic complications. Needs Instruction   Patient understands how to develop strategies to address psychosocial issues. Needs Instruction   Patient understands how to develop strategies to  promote health/change behavior. Needs Instruction     Complications   Last HgB A1C per patient/outside source 6.9 %   How often do you check your blood sugar? 0 times/day (not testing)   Number of hypoglycemic episodes per month 3   Can you tell when your blood sugar is low? No   Number of hyperglycemic episodes per week 0   Have you had a dilated eye exam in the past 12 months? No   Have you had a dental exam in the past 12 months? No   Are you checking your feet? No     Dietary Intake   Breakfast grapfruit, grits, sausage, toast with cherry jam   Lunch salad with crackers yogurt grapes nuts 5 dark chocolate candies and somtimes tuna   Dinner spagetti or chicken pan pie or chicken casserole    Snack (evening) milk and cookies---ice cream and cake---pork rins   Beverage(s) coffee with milk, water, soda     Exercise   Exercise Type ADL's   How many days per week to you exercise? 0     Patient Education   Previous Diabetes Education No   Disease state  Definition of diabetes, type 1 and 2, and the diagnosis of diabetes;Factors that contribute to the development of diabetes   Nutrition management  Role of diet in the treatment of diabetes and the relationship between the three main macronutrients and blood glucose level;Food label reading, portion sizes and measuring food.;Reviewed blood glucose goals for pre and post meals and how to evaluate the patients' food intake on their blood glucose  level.;Carbohydrate counting;Information on hints to eating out and maintain blood glucose control.   Physical activity and exercise  Role of exercise on diabetes management, blood pressure control and cardiac health.;Helped patient identify appropriate exercises in relation to his/her diabetes, diabetes complications and other health issue.   Monitoring Taught/evaluated SMBG meter.;Purpose and frequency of SMBG.;Yearly dilated eye exam;Daily foot exams   Acute complications Taught treatment of  hypoglycemia - the 15 rule.;Discussed and identified patients' treatment of hyperglycemia.   Chronic complications Lipid levels, blood glucose control and heart disease;Assessed and discussed foot care and prevention of foot problems;Dental care;Retinopathy and reason for yearly dilated eye exams   Psychosocial adjustment Worked with patient to identify barriers to care and solutions;Role of stress on diabetes     Individualized Goals (developed by patient)   Nutrition Follow meal plan discussed;General guidelines for healthy choices and portions discussed   Physical Activity Exercise 5-7 days per week;30 minutes per day     Post-Education Assessment   Patient understands the diabetes disease and treatment process. Demonstrates understanding / competency   Patient understands incorporating nutritional management into lifestyle. Demonstrates understanding / competency   Patient undertands incorporating physical activity into lifestyle. Demonstrates understanding / competency   Patient understands using medications safely. Demonstrates understanding / competency   Patient understands monitoring blood glucose, interpreting and using results Demonstrates understanding / competency   Patient understands prevention, detection, and treatment of acute complications. Demonstrates understanding / competency   Patient understands prevention, detection, and treatment of chronic complications. Demonstrates understanding / competency   Patient understands how to develop strategies to address psychosocial issues. Demonstrates understanding / competency   Patient understands how to develop strategies to promote health/change behavior. Demonstrates understanding / competency     Outcomes   Expected Outcomes Demonstrated interest in learning. Expect positive outcomes   Future DMSE PRN   Program Status Completed      Individualized Plan for Diabetes Self-Management Training:   Learning Objective:  Patient  will have a greater understanding of diabetes self-management. Patient education plan is to attend individual and/or group sessions per assessed needs and concerns.   Plan:   Patient Instructions  -Always bring your meter with you everywhere you go -Always Properly dispose of your needles:  -Discard in a hard plastic/metal container with a lid (something the needle can't puncture)  -Write Do Not Recycle on the outside of the container  -Example: A laundry detergent bottle -Never use the same needle more than once -Eat 34 carbohydrate choices for each meal and 1 for each snack -A meal: carbohydrates, protein, vegetable -A snack: A Fruit OR Vegetable AND Protein  -Try to be more active -Always pay attention to your body keeping watchful of possible low blood sugar (below 70) or high blood sugar (above 200)  -Check your feet every day looking for anything that was not there the day before   Fasting 80-130  2 hours after under 180: check 1 time every day at different times throughout the week, always write down these numbers      Expected Outcomes:  Demonstrated interest in learning. Expect positive outcomes  Education material provided: Living Well with Diabetes, Meal plan card, My Plate, Snack sheet and Support group flyer  If problems or questions, patient to contact team via:  Phone  Future DSME appointment: PRN

## 2017-02-12 NOTE — Patient Instructions (Signed)
-  Always bring your meter with you everywhere you go -Always Properly dispose of your needles:  -Discard in a hard plastic/metal container with a lid (something the needle can't puncture)  -Write Do Not Recycle on the outside of the container  -Example: A laundry detergent bottle -Never use the same needle more than once -Eat 34 carbohydrate choices for each meal and 1 for each snack -A meal: carbohydrates, protein, vegetable -A snack: A Fruit OR Vegetable AND Protein  -Try to be more active -Always pay attention to your body keeping watchful of possible low blood sugar (below 70) or high blood sugar (above 200)  -Check your feet every day looking for anything that was not there the day before   Fasting 80-130  2 hours after under 180: check 1 time every day at different times throughout the week, always write down these numbers

## 2017-02-13 ENCOUNTER — Other Ambulatory Visit: Payer: Self-pay | Admitting: Family Medicine

## 2017-02-13 ENCOUNTER — Ambulatory Visit
Admission: RE | Admit: 2017-02-13 | Discharge: 2017-02-13 | Disposition: A | Discharge status: Home / Self Care | Payer: BLUE CROSS/BLUE SHIELD | Source: Ambulatory Visit | Attending: Family Medicine | Admitting: Family Medicine

## 2017-02-13 DIAGNOSIS — R05 Cough: Secondary | ICD-10-CM

## 2017-02-13 DIAGNOSIS — R059 Cough, unspecified: Secondary | ICD-10-CM

## 2017-05-06 ENCOUNTER — Emergency Department (HOSPITAL_COMMUNITY): Payer: BLUE CROSS/BLUE SHIELD

## 2017-05-06 ENCOUNTER — Other Ambulatory Visit: Payer: Self-pay

## 2017-05-06 ENCOUNTER — Encounter (HOSPITAL_COMMUNITY): Payer: Self-pay

## 2017-05-06 ENCOUNTER — Observation Stay (HOSPITAL_COMMUNITY)
Admission: EM | Admit: 2017-05-06 | Discharge: 2017-05-08 | Disposition: A | Discharge status: Home / Self Care | Payer: BLUE CROSS/BLUE SHIELD | Attending: Internal Medicine | Admitting: Internal Medicine

## 2017-05-06 DIAGNOSIS — Z8249 Family history of ischemic heart disease and other diseases of the circulatory system: Secondary | ICD-10-CM | POA: Insufficient documentation

## 2017-05-06 DIAGNOSIS — Z7982 Long term (current) use of aspirin: Secondary | ICD-10-CM | POA: Diagnosis not present

## 2017-05-06 DIAGNOSIS — I700 Atherosclerosis of aorta: Secondary | ICD-10-CM | POA: Diagnosis not present

## 2017-05-06 DIAGNOSIS — R531 Weakness: Secondary | ICD-10-CM | POA: Insufficient documentation

## 2017-05-06 DIAGNOSIS — Z833 Family history of diabetes mellitus: Secondary | ICD-10-CM | POA: Insufficient documentation

## 2017-05-06 DIAGNOSIS — E669 Obesity, unspecified: Secondary | ICD-10-CM | POA: Diagnosis not present

## 2017-05-06 DIAGNOSIS — R0600 Dyspnea, unspecified: Secondary | ICD-10-CM | POA: Diagnosis present

## 2017-05-06 DIAGNOSIS — Z87891 Personal history of nicotine dependence: Secondary | ICD-10-CM | POA: Insufficient documentation

## 2017-05-06 DIAGNOSIS — Z96641 Presence of right artificial hip joint: Secondary | ICD-10-CM | POA: Diagnosis not present

## 2017-05-06 DIAGNOSIS — Z885 Allergy status to narcotic agent status: Secondary | ICD-10-CM | POA: Diagnosis not present

## 2017-05-06 DIAGNOSIS — Z809 Family history of malignant neoplasm, unspecified: Secondary | ICD-10-CM | POA: Diagnosis not present

## 2017-05-06 DIAGNOSIS — F329 Major depressive disorder, single episode, unspecified: Secondary | ICD-10-CM | POA: Diagnosis not present

## 2017-05-06 DIAGNOSIS — I10 Essential (primary) hypertension: Secondary | ICD-10-CM | POA: Diagnosis present

## 2017-05-06 DIAGNOSIS — R000 Tachycardia, unspecified: Secondary | ICD-10-CM | POA: Diagnosis not present

## 2017-05-06 DIAGNOSIS — J09X2 Influenza due to identified novel influenza A virus with other respiratory manifestations: Principal | ICD-10-CM | POA: Insufficient documentation

## 2017-05-06 DIAGNOSIS — R351 Nocturia: Secondary | ICD-10-CM | POA: Insufficient documentation

## 2017-05-06 DIAGNOSIS — Z6836 Body mass index (BMI) 36.0-36.9, adult: Secondary | ICD-10-CM | POA: Insufficient documentation

## 2017-05-06 DIAGNOSIS — R9431 Abnormal electrocardiogram [ECG] [EKG]: Secondary | ICD-10-CM | POA: Insufficient documentation

## 2017-05-06 DIAGNOSIS — E119 Type 2 diabetes mellitus without complications: Secondary | ICD-10-CM | POA: Insufficient documentation

## 2017-05-06 DIAGNOSIS — M1990 Unspecified osteoarthritis, unspecified site: Secondary | ICD-10-CM | POA: Diagnosis not present

## 2017-05-06 DIAGNOSIS — K760 Fatty (change of) liver, not elsewhere classified: Secondary | ICD-10-CM | POA: Diagnosis not present

## 2017-05-06 DIAGNOSIS — Z79899 Other long term (current) drug therapy: Secondary | ICD-10-CM | POA: Diagnosis not present

## 2017-05-06 DIAGNOSIS — J101 Influenza due to other identified influenza virus with other respiratory manifestations: Secondary | ICD-10-CM | POA: Diagnosis present

## 2017-05-06 DIAGNOSIS — R110 Nausea: Secondary | ICD-10-CM | POA: Diagnosis not present

## 2017-05-06 DIAGNOSIS — R42 Dizziness and giddiness: Secondary | ICD-10-CM | POA: Diagnosis not present

## 2017-05-06 DIAGNOSIS — J9601 Acute respiratory failure with hypoxia: Secondary | ICD-10-CM | POA: Diagnosis present

## 2017-05-06 LAB — BRAIN NATRIURETIC PEPTIDE: B Natriuretic Peptide: 13.4 pg/mL (ref 0.0–100.0)

## 2017-05-06 LAB — I-STAT CG4 LACTIC ACID, ED: LACTIC ACID, VENOUS: 1.3 mmol/L (ref 0.5–1.9)

## 2017-05-06 LAB — BASIC METABOLIC PANEL
Anion gap: 14 (ref 5–15)
BUN: 9 mg/dL (ref 6–20)
CO2: 22 mmol/L (ref 22–32)
CREATININE: 0.85 mg/dL (ref 0.61–1.24)
Calcium: 9.3 mg/dL (ref 8.9–10.3)
Chloride: 99 mmol/L — ABNORMAL LOW (ref 101–111)
GFR calc Af Amer: >60 mL/min (ref >60)
Glucose, Bld: 143 mg/dL — ABNORMAL HIGH (ref 65–99)
Potassium: 4 mmol/L (ref 3.5–5.1)
SODIUM: 135 mmol/L (ref 135–145)

## 2017-05-06 LAB — I-STAT TROPONIN, ED: Troponin i, poc: 0.01 ng/mL (ref 0.00–0.08)

## 2017-05-06 LAB — CBC
HCT: 45.9 % (ref 39.0–52.0)
Hemoglobin: 15.8 g/dL (ref 13.0–17.0)
MCH: 27 pg (ref 26.0–34.0)
MCHC: 34.4 g/dL (ref 30.0–36.0)
MCV: 78.3 fL (ref 78.0–100.0)
PLATELETS: 182 10*3/uL (ref 150–400)
RBC: 5.86 MIL/uL — ABNORMAL HIGH (ref 4.22–5.81)
RDW: 13.9 % (ref 11.5–15.5)
WBC: 9.7 10*3/uL (ref 4.0–10.5)

## 2017-05-06 MED ORDER — ACETAMINOPHEN 325 MG PO TABS
650.0000 mg | ORAL_TABLET | Freq: Once | ORAL | Status: AC | PRN
  Administered 2017-05-06: 650 mg via ORAL
  Filled 2017-05-06: qty 2

## 2017-05-06 MED ORDER — IOPAMIDOL (ISOVUE-370) INJECTION 76%
INTRAVENOUS | Status: AC
  Administered 2017-05-06: 100 mL
  Filled 2017-05-06: qty 100

## 2017-05-06 NOTE — ED Provider Notes (Signed)
[facility] EMERGENCY DEPARTMENT Provider Note   CSN: [id number] Arrival date & time: 05/06/17  1720     History   Chief Complaint Chief Complaint  Patient presents with  . Shortness of Breath    HPI Jonathan Riley is a 57 y.o. male.  Patient with PMH of DM, HTN, and exertional dyspnea presents to the ED with a chief complaint of SOB.  He states that his symptoms started suddenly this morning.  States that he became very out of breath while walking at work today.  He states that this was unusual for him.  He reports having had a dry cough since last night.  He reports having associated fever.  He reports that he has had some lower extremity swelling.  He denies any history of CHF, ACS, PE or DVT.  Denies any recent long travel or immobilization.  He denies chest pain, except for when he is coughing.  Denies numbness, weakness, or tingling.   The history is provided by the patient. No language interpreter was used.    Past Medical History:  Diagnosis Date  . Arthritis   . Depression   . Diabetes mellitus without complication (HCC)   . Dyspnea    "with exertion"  . Hypertension   . Nocturia    1-2 times during night  . PONV (postoperative nausea and vomiting)    "nausea after a knee surgery"    Patient Active Problem List   Diagnosis Date Noted  . Primary osteoarthritis of right hip 03/03/2016  . Alteration of consciousness 02/21/2013    Past Surgical History:  Procedure Laterality Date  . COLONOSCOPY    . KNEE SURGERY Bilateral    Right, Left x2  . 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 WRIST SURGERY Right 2008 or 2009       Home Medications    Prior to Admission medications   Medication Sig Start Date End Date Taking? Authorizing Provider  amLODipine (NORVASC) 5 MG tablet Take 5 mg by  mouth daily. 01/31/16  Yes [provider]  aspirin EC 325 MG tablet Take 1-2 tablets (325-650 mg total) by mouth 2 (two) times daily with a meal. Patient taking differently: Take 650 mg by mouth daily.  03/04/16  Yes Swinteck, Arlys John, MD  CIALIS 20 MG tablet Take 20 mg by mouth daily as needed for erectile dysfunction.  02/12/13  Yes [provider]  Cyanocobalamin (VITAMIN B-12 PO) Take 1 tablet by mouth daily.   Yes [provider]  Pseudoephedrine-APAP-DM (DAYQUIL MULTI-SYMPTOM COLD/FLU PO) Take 15 mLs by mouth every 6 (six) hours as needed (for symptoms).    Yes [provider]  telmisartan (MICARDIS) 80 MG tablet Take 80 mg by mouth daily.   Yes [provider]  docusate sodium (COLACE) 100 MG capsule Take 1 capsule (100 mg total) by mouth 2 (two) times daily. Patient not taking: Reported on 05/06/2017 03/04/16   Samson Frederic, MD  HYDROcodone-acetaminophen (NORCO/VICODIN) 5-325 MG tablet Take 1-2 tablets by mouth every 4 (four) hours as needed (breakthrough pain). Patient not taking: Reported on 05/06/2017 03/04/16   Samson Frederic, MD  methocarbamol (ROBAXIN) 500 MG tablet Take 1 tablet (500 mg total) by mouth every 6 (six) hours as needed for muscle spasms. Patient not taking: Reported on 05/06/2017 03/04/16   Samson Frederic, MD  ondansetron [facility])  4 MG tablet Take 1 tablet (4 mg total) by mouth every 6 (six) hours as needed for nausea. Patient not taking: Reported on 05/06/2017 03/04/16   Samson FredericSwinteck, Brian, MD  senna (SENOKOT) 8.6 MG TABS tablet Take 2 tablets (17.2 mg total) by mouth at bedtime. Patient not taking: Reported on 05/06/2017 03/04/16   Samson FredericSwinteck, Brian, MD    Family History Family History  Problem Relation Age of Onset  . Heart disease Unknown   . Diabetes Unknown   . Cancer Unknown     Social History Social History   Tobacco Use  . Smoking status: Former Smoker    Types: Cigarettes  . Smokeless tobacco: Never Used  .  Tobacco comment: "quit about 12 years ago" 02/26/2016  Substance Use Topics  . Alcohol use: No  . Drug use: No     Allergies   Percocet [oxycodone-acetaminophen]   Review of Systems Review of Systems  All other systems reviewed and are negative.    Physical Exam Updated Vital Signs BP (!) 144/83   Pulse (!) 103   Temp (!) 101.5 F (38.6 C) (Oral)   Resp 18   Wt 108.9 kg (240 lb)   SpO2 95%   BMI 34.44 kg/m   Physical Exam  Constitutional: He is oriented to person, place, and time. He appears well-developed and well-nourished.  HENT:  Head: Normocephalic and atraumatic.  Eyes: Conjunctivae and EOM are normal. Pupils are equal, round, and reactive to light. Right eye exhibits no discharge. Left eye exhibits no discharge. No scleral icterus.  Neck: Normal range of motion. Neck supple. No JVD present.  Cardiovascular: Normal rate, regular rhythm and normal heart sounds. Exam reveals no gallop and no friction rub.  No murmur heard. Pulmonary/Chest: Effort normal and breath sounds normal. No respiratory distress. He has no wheezes. He has no rales. He exhibits no tenderness.  Abdominal: Soft. He exhibits no distension and no mass. There is no tenderness. There is no rebound and no guarding.  Musculoskeletal: Normal range of motion. He exhibits no tenderness.       Right lower leg: He exhibits edema.       Left lower leg: He exhibits edema.  Neurological: He is alert and oriented to person, place, and time.  Skin: Skin is warm and dry.  Psychiatric: He has a normal mood and affect. His behavior is normal. Judgment and thought content normal.  Nursing note and vitals reviewed.    ED Treatments / Results  Labs (all labs ordered are listed, but only abnormal results are displayed) Labs Reviewed  BASIC METABOLIC PANEL - Abnormal; Notable for the following components:      Result Value   Chloride 99 (*)    Glucose, Bld 143 (*)    All other components within normal limits    CBC - Abnormal; Notable for the following components:   RBC 5.86 (*)    All other components within normal limits  BRAIN NATRIURETIC PEPTIDE  I-STAT TROPONIN, ED  I-STAT CG4 LACTIC ACID, ED  I-STAT CG4 LACTIC ACID, ED    EKG  EKG Interpretation  Date/Time:  Wednesday May 06 2017 17:29:56 EST Ventricular Rate:  101 PR Interval:  166 QRS Duration: 86 QT Interval:  320 QTC Calculation: 414 R Axis:   10 Text Interpretation:  Sinus tachycardia Septal infarct , age undetermined Abnormal ECG Confirmed by Jacalyn LefevreHaviland, Julie [phone]([id number]) on 05/06/2017 9:51:34 PM       Radiology Dg Chest 2 View  Result Date: 05/06/2017  CLINICAL DATA:  Acute presentation with shortness of breath over the last day. Abnormal EKG. EXAM: CHEST  2 VIEW COMPARISON:  02/13/2017 FINDINGS: Heart size is normal. There is aortic atherosclerosis. Question mild venous hypertension without frank edema. No infiltrate, collapse or effusion. No bone abnormality. IMPRESSION: No definite active finding. Aortic atherosclerosis. Question mild venous hypertension. Electronically Signed   By: Paulina Fusi M.D.   On: 05/06/2017 17:59    Procedures Procedures (including critical care time)  Medications Ordered in ED Medications  acetaminophen (TYLENOL) tablet 650 mg (650 mg Oral Given 05/06/17 1737)     Initial Impression / Assessment and Plan / ED Course  I have reviewed the triage vital signs and the nursing notes.  Pertinent labs & imaging results that were available during my care of the patient were reviewed by me and considered in my medical decision making (see chart for details).     Patient with SOB since this morning.  No chest pain, except when coughing.  Does have a dry cough and fever, but CXR shows no opacity.  Possible venous hypertension.  Tachycardic, but likely 2/2 to fever.  Will check BNP.  There is also concern for PE given the amount of SOB.  Will check CT PE.  No acute ischemic EKG changes.  Labs are  unremarkable.  Discussed with Dr. Particia Nearing, who recommends flu swab.  Recommends admission for SOB and rule out.  Appreciate Dr. Toniann Fail for admitting the patient.  Final Clinical Impressions(s) / ED Diagnoses   Final diagnoses:  Dyspnea, unspecified type    ED Discharge Orders    None       Roxy Horseman, PA-C 05/07/17 1610    Jacalyn Lefevre, MD 05/12/17 [phone]

## 2017-05-06 NOTE — ED Triage Notes (Addendum)
Pt presents to the ed with complaints of shortness of breath x 1 day, denies any chest pain other than when he coughs. He went to his doctor who said his EKG was not normal and sent him here, ekg done in triage. Pt was negative for the flu today

## 2017-05-06 NOTE — ED Notes (Signed)
Patient transported to CT scan . 

## 2017-05-07 ENCOUNTER — Encounter (HOSPITAL_COMMUNITY): Payer: Self-pay | Admitting: Internal Medicine

## 2017-05-07 ENCOUNTER — Ambulatory Visit (HOSPITAL_COMMUNITY): Payer: BLUE CROSS/BLUE SHIELD

## 2017-05-07 DIAGNOSIS — J9601 Acute respiratory failure with hypoxia: Secondary | ICD-10-CM

## 2017-05-07 DIAGNOSIS — R0600 Dyspnea, unspecified: Secondary | ICD-10-CM | POA: Diagnosis present

## 2017-05-07 DIAGNOSIS — I10 Essential (primary) hypertension: Secondary | ICD-10-CM | POA: Diagnosis present

## 2017-05-07 DIAGNOSIS — J101 Influenza due to other identified influenza virus with other respiratory manifestations: Secondary | ICD-10-CM | POA: Diagnosis not present

## 2017-05-07 LAB — BASIC METABOLIC PANEL
Anion gap: 13 (ref 5–15)
BUN: 11 mg/dL (ref 6–20)
CHLORIDE: 99 mmol/L — AB (ref 101–111)
CO2: 21 mmol/L — ABNORMAL LOW (ref 22–32)
CREATININE: 0.9 mg/dL (ref 0.61–1.24)
Calcium: 8.8 mg/dL — ABNORMAL LOW (ref 8.9–10.3)
GFR calc Af Amer: >60 mL/min (ref >60)
Glucose, Bld: 139 mg/dL — ABNORMAL HIGH (ref 65–99)
Potassium: 3.8 mmol/L (ref 3.5–5.1)
SODIUM: 133 mmol/L — AB (ref 135–145)

## 2017-05-07 LAB — CBC
HCT: 43.6 % (ref 39.0–52.0)
Hemoglobin: 14.5 g/dL (ref 13.0–17.0)
MCH: 26.1 pg (ref 26.0–34.0)
MCHC: 33.3 g/dL (ref 30.0–36.0)
MCV: 78.4 fL (ref 78.0–100.0)
PLATELETS: 161 10*3/uL (ref 150–400)
RBC: 5.56 MIL/uL (ref 4.22–5.81)
RDW: 14.1 % (ref 11.5–15.5)
WBC: 6.6 10*3/uL (ref 4.0–10.5)

## 2017-05-07 LAB — HIV ANTIBODY (ROUTINE TESTING W REFLEX): HIV Screen 4th Generation wRfx: NONREACTIVE

## 2017-05-07 LAB — TSH: TSH: 0.663 u[IU]/mL (ref 0.350–4.500)

## 2017-05-07 LAB — PROCALCITONIN: PROCALCITONIN: 0.14 ng/mL

## 2017-05-07 LAB — INFLUENZA PANEL BY PCR (TYPE A & B)
INFLBPCR: NEGATIVE
Influenza A By PCR: POSITIVE — AB

## 2017-05-07 LAB — STREP PNEUMONIAE URINARY ANTIGEN: Strep Pneumo Urinary Antigen: NEGATIVE

## 2017-05-07 MED ORDER — OSELTAMIVIR PHOSPHATE 75 MG PO CAPS
75.0000 mg | ORAL_CAPSULE | Freq: Two times a day (BID) | ORAL | Status: DC
  Administered 2017-05-07 – 2017-05-08 (×4): 75 mg via ORAL
  Filled 2017-05-07 (×4): qty 1

## 2017-05-07 MED ORDER — ALBUTEROL SULFATE (2.5 MG/3ML) 0.083% IN NEBU: 2.5000 mg | INHALATION_SOLUTION | RESPIRATORY_TRACT | Status: DC | PRN

## 2017-05-07 MED ORDER — ONDANSETRON HCL 4 MG PO TABS: 4.0000 mg | ORAL_TABLET | Freq: Four times a day (QID) | ORAL | Status: DC | PRN

## 2017-05-07 MED ORDER — ENOXAPARIN SODIUM 40 MG/0.4ML ~~LOC~~ SOLN
40.0000 mg | SUBCUTANEOUS | Status: DC
  Administered 2017-05-07: 40 mg via SUBCUTANEOUS
  Filled 2017-05-07 (×2): qty 0.4

## 2017-05-07 MED ORDER — SODIUM CHLORIDE 0.9 % IV SOLN
INTRAVENOUS | Status: AC
  Administered 2017-05-07 – 2017-05-08 (×3): via INTRAVENOUS

## 2017-05-07 MED ORDER — IRBESARTAN 300 MG PO TABS
300.0000 mg | ORAL_TABLET | Freq: Every day | ORAL | Status: DC
  Administered 2017-05-07 – 2017-05-08 (×2): 300 mg via ORAL
  Filled 2017-05-07 (×2): qty 1

## 2017-05-07 MED ORDER — AMLODIPINE BESYLATE 5 MG PO TABS
5.0000 mg | ORAL_TABLET | Freq: Every day | ORAL | Status: DC
  Administered 2017-05-07 – 2017-05-08 (×2): 5 mg via ORAL
  Filled 2017-05-07 (×2): qty 1

## 2017-05-07 MED ORDER — ONDANSETRON HCL 4 MG/2ML IJ SOLN: 4.0000 mg | Freq: Four times a day (QID) | INTRAMUSCULAR | Status: DC | PRN

## 2017-05-07 MED ORDER — ALBUTEROL SULFATE (2.5 MG/3ML) 0.083% IN NEBU
2.5000 mg | INHALATION_SOLUTION | RESPIRATORY_TRACT | Status: DC
  Administered 2017-05-07 (×2): 2.5 mg via RESPIRATORY_TRACT
  Filled 2017-05-07 (×2): qty 3

## 2017-05-07 MED ORDER — ACETAMINOPHEN 650 MG RE SUPP: 650.0000 mg | Freq: Four times a day (QID) | RECTAL | Status: DC | PRN

## 2017-05-07 MED ORDER — ACETAMINOPHEN 325 MG PO TABS: 650.0000 mg | ORAL_TABLET | Freq: Four times a day (QID) | ORAL | Status: DC | PRN

## 2017-05-07 NOTE — Progress Notes (Signed)
Pt admitted to room 3E20 via stretcher from ER. Pt able to walk to bed. O2 sats 94% RA on arrival, O2 left off at this time. Pt oriented to room, call bells and how to call for assist via call bell or phone. Pt states he does fell better now than when he first arrived to hospital. No distress, resting in bed, IVF infusing, po fluids offered, pt ate lunch in ER, pt shown how to order dinner. Daughter at bedside.

## 2017-05-07 NOTE — ED Notes (Signed)
Breakfast ordered 

## 2017-05-07 NOTE — ED Notes (Signed)
Pt. Ambulated in hall on pulse ox. Saturations were at 92%.

## 2017-05-07 NOTE — H&P (Signed)
History and Physical    Jonathan Riley ZOX:[id number] DOB: 1961-02-08 DOA: 05/06/2017  PCP: Blair Heys, MD  Patient coming from: Home.  Chief Complaint: Shortness of breath.  HPI: Jonathan Riley is a 57 y.o. male with history of hypertension presents to the ER because of worsening shortness of breath over the last 24 hours.  Patient started having some productive cough night before tonight.  From yesterday morning patient was getting more short of breath with minimal exertion.  Feels dizzy and weak and nauseated.  Denies any vomiting abdominal pain.  Denies any chest pain.  Had subjective feeling of fever and chills.  Patient had gone to his local office physician and at that time flu was negative.  Due to persistent symptoms patient was referred to the ER.  ED Course: In the ER patient was hypoxic and was tachycardic on presentation with temperature of 101.5 F.  CT angiogram of the chest was negative for pulmonary embolism did show some coronary calcifications and cardiomegaly.  BNP was normal.  Patient turned out to be flu positive.  On exam patient has mild wheezing.  Patient admitted for further observation.  Tamiflu started.  Review of Systems: As per HPI, rest all negative.   Past Medical History:  Diagnosis Date  . Arthritis   . Depression   . Diabetes mellitus without complication (HCC)   . Dyspnea    "with exertion"  . Hypertension   . Nocturia    1-2 times during night  . PONV (postoperative nausea and vomiting)    "nausea after a knee surgery"    Past Surgical History:  Procedure Laterality Date  . COLONOSCOPY    . KNEE SURGERY Bilateral    Right, Left x2  . 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 WRIST SURGERY Right 2008 or 2009     reports that he has quit smoking. His smoking use included cigarettes. he  has never used smokeless tobacco. He reports that he does not drink alcohol or use drugs.  Allergies  Allergen Reactions  . Percocet [Oxycodone-Acetaminophen] Nausea And Vomiting    Family History  Problem Relation Age of Onset  . Heart disease Unknown   . Diabetes Unknown   . Cancer Unknown     Prior to Admission medications   Medication Sig Start Date End Date Taking? Authorizing Provider  amLODipine (NORVASC) 5 MG tablet Take 5 mg by mouth daily. 01/31/16  Yes [provider]  aspirin EC 325 MG tablet Take 1-2 tablets (325-650 mg total) by mouth 2 (two) times daily with a meal. Patient taking differently: Take 650 mg by mouth daily.  03/04/16  Yes Swinteck, Arlys John, MD  CIALIS 20 MG tablet Take 20 mg by mouth daily as needed for erectile dysfunction.  02/12/13  Yes [provider]  Cyanocobalamin (VITAMIN B-12 PO) Take 1 tablet by mouth daily.   Yes [provider]  Pseudoephedrine-APAP-DM (DAYQUIL MULTI-SYMPTOM COLD/FLU PO) Take 15 mLs by mouth every 6 (six) hours as needed (for symptoms).    Yes [provider]  telmisartan (MICARDIS) 80 MG tablet Take 80 mg by mouth daily.   Yes [provider]  docusate sodium (COLACE) 100 MG capsule Take 1 capsule (100 mg total) by mouth 2 (two) times daily. Patient not taking: Reported on 05/06/2017 03/04/16   Samson Frederic, MD  HYDROcodone-acetaminophen (NORCO/VICODIN)  5-325 MG tablet Take 1-2 tablets by mouth every 4 (four) hours as needed (breakthrough pain). Patient not taking: Reported on 05/06/2017 03/04/16   Samson FredericSwinteck, Brian, MD  methocarbamol (ROBAXIN) 500 MG tablet Take 1 tablet (500 mg total) by mouth every 6 (six) hours as needed for muscle spasms. Patient not taking: Reported on 05/06/2017 03/04/16   Samson FredericSwinteck, Brian, MD  ondansetron (ZOFRAN) 4 MG tablet Take 1 tablet (4 mg total) by mouth every 6 (six) hours as needed for nausea. Patient not taking: Reported on 05/06/2017 03/04/16   Samson FredericSwinteck,  Brian, MD  senna (SENOKOT) 8.6 MG TABS tablet Take 2 tablets (17.2 mg total) by mouth at bedtime. Patient not taking: Reported on 05/06/2017 03/04/16   Samson FredericSwinteck, Brian, MD    Physical Exam: Vitals:   05/07/17 0130 05/07/17 0200 05/07/17 0248 05/07/17 0300  BP: 121/69 (!) 152/90 (!) 147/69 137/67  Pulse: 86 84 85 88  Resp: (!) 24 (!) 22 (!) 27 (!) 22  Temp:      TempSrc:      SpO2: 90% 92% 92% 93%  Weight:          Constitutional: Moderately built and nourished. Vitals:   05/07/17 0130 05/07/17 0200 05/07/17 0248 05/07/17 0300  BP: 121/69 (!) 152/90 (!) 147/69 137/67  Pulse: 86 84 85 88  Resp: (!) 24 (!) 22 (!) 27 (!) 22  Temp:      TempSrc:      SpO2: 90% 92% 92% 93%  Weight:       Eyes: Anicteric no pallor. ENMT: No discharge from the ears eyes nose or mouth. Neck: No JVD appreciated no mass felt. Respiratory: Mild expiratory wheezes no crepitations. Cardiovascular: S1-S2 heard tachycardic. Abdomen: Soft nontender bowel sounds present. Musculoskeletal: No edema.  No joint effusion. Skin: No rash.  Skin appears warm. Neurologic: Alert awake oriented to time place and person.  Moves all extremities 5 x 5. Psychiatric: Appears normal.  Normal affect.   Labs on Admission: I have personally reviewed following labs and imaging studies  CBC: Recent Labs  Lab 05/06/17 1732  WBC 9.7  HGB 15.8  HCT 45.9  MCV 78.3  PLT 182   Basic Metabolic Panel: Recent Labs  Lab 05/06/17 1732  NA 135  K 4.0  CL 99*  CO2 22  GLUCOSE 143*  BUN 9  CREATININE 0.85  CALCIUM 9.3   GFR: CrCl cannot be calculated (Unknown ideal weight.). Liver Function Tests: No results for input(s): AST, ALT, ALKPHOS, BILITOT, PROT, ALBUMIN in the last 168 hours. No results for input(s): LIPASE, AMYLASE in the last 168 hours. No results for input(s): AMMONIA in the last 168 hours. Coagulation Profile: No results for input(s): INR, PROTIME in the last 168 hours. Cardiac Enzymes: No results  for input(s): CKTOTAL, CKMB, CKMBINDEX, TROPONINI in the last 168 hours. BNP (last 3 results) No results for input(s): PROBNP in the last 8760 hours. HbA1C: No results for input(s): HGBA1C in the last 72 hours. CBG: No results for input(s): GLUCAP in the last 168 hours. Lipid Profile: No results for input(s): CHOL, HDL, LDLCALC, TRIG, CHOLHDL, LDLDIRECT in the last 72 hours. Thyroid Function Tests: No results for input(s): TSH, T4TOTAL, FREET4, T3FREE, THYROIDAB in the last 72 hours. Anemia Panel: No results for input(s): VITAMINB12, FOLATE, FERRITIN, TIBC, IRON, RETICCTPCT in the last 72 hours. Urine analysis: No results found for: COLORURINE, APPEARANCEUR, LABSPEC, PHURINE, GLUCOSEU, HGBUR, BILIRUBINUR, KETONESUR, PROTEINUR, UROBILINOGEN, NITRITE, LEUKOCYTESUR Sepsis Labs: @LABRCNTIP (procalcitonin:4,lacticidven:4) )No results found for this or any  previous visit (from the past 240 hour(s)).   Radiological Exams on Admission: Dg Chest 2 View  Result Date: 05/06/2017 CLINICAL DATA:  Acute presentation with shortness of breath over the last day. Abnormal EKG. EXAM: CHEST  2 VIEW COMPARISON:  02/13/2017 FINDINGS: Heart size is normal. There is aortic atherosclerosis. Question mild venous hypertension without frank edema. No infiltrate, collapse or effusion. No bone abnormality. IMPRESSION: No definite active finding. Aortic atherosclerosis. Question mild venous hypertension. Electronically Signed   By: Paulina Fusi M.D.   On: 05/06/2017 17:59   Ct Angio Chest Pe W And/or Wo Contrast  Result Date: 05/06/2017 CLINICAL DATA:  57 year old male with shortness of breath. EXAM: CT ANGIOGRAPHY CHEST WITH CONTRAST TECHNIQUE: Multidetector CT imaging of the chest was performed using the standard protocol during bolus administration of intravenous contrast. Multiplanar CT image reconstructions and MIPs were obtained to evaluate the vascular anatomy. CONTRAST:  ISOVUE-370 IOPAMIDOL (ISOVUE-370)  INJECTION 76% COMPARISON:  Chest radiograph dated 05/06/2017 FINDINGS: Cardiovascular: There is mild cardiomegaly. Multi vessel coronary vascular calcification. There is no pericardial effusion. Mild atherosclerotic calcification of the thoracic aorta. Mild the origins of the great vessels of the aortic arch are patent. Evaluation of the pulmonary arteries is limited due to respiratory motion artifact and suboptimal enhancement of the peripheral branches. No large or central pulmonary artery embolus identified. Mediastinum/Nodes: No hilar or mediastinal adenopathy. The esophagus is grossly unremarkable. No mediastinal fluid collection. Lungs/Pleura: Right lung base linear atelectatic changes noted. There is no focal consolidation, pleural effusion, or pneumothorax. The central airways are patent. Upper Abdomen: Diffuse fatty infiltration of the liver. The visualized upper abdomen is otherwise unremarkable. Musculoskeletal: No chest wall abnormality. No acute or significant osseous findings. Review of the MIP images confirms the above findings. IMPRESSION: 1. No acute intrathoracic pathology. No CT evidence of central pulmonary artery embolus. 2. Mild cardiomegaly and coronary vascular calcification. 3. Right lung base linear atelectasis. 4. Fatty liver. 5. Mild Aortic Atherosclerosis (ICD10-I70.0). Electronically Signed   By: Elgie Collard M.D.   On: 05/06/2017 23:53    EKG: Independently reviewed.  Normal sinus rhythm with nonspecific ST-T changes concerning for infarct age-indeterminate.  Assessment/Plan Principal Problem:   Acute respiratory failure with hypoxia (HCC) Active Problems:   Dyspnea   Influenza A   Essential hypertension    1. Acute respiratory failure with hypoxia likely from influenza A -patient has been placed on Tamiflu and since patient has mild wheezing I have placed patient on scheduled dose of albuterol nebulizer.  Gently hydrate.  Since CT scan shows cardiomegaly and EKG  shows age-indeterminate infarct I have ordered 2D echo. 2. Hypertension we will continue with home medications including amlodipine and Micardis. 3. History of diabetes mellitus per the chart  -follow blood glucose and CBG.   DVT prophylaxis: Lovenox. Code Status: Full code. Family Communication: Discussed with patient's wife. Disposition Plan: Home. Consults called: None. Admission status: Observation.   Eduard Clos MD [facility] Pager [phone].  If 7PM-7AM, please contact night-coverage [email or web address] Password [facility]  05/07/2017, 3:53 AM

## 2017-05-07 NOTE — Progress Notes (Signed)
Attempted to do echo in ED but patient was in the process of being transported.  Will attempt echo at a later time.

## 2017-05-07 NOTE — Progress Notes (Signed)
Title: A Randomized, Double-Blind, Placebo-Controlled Dose Ranging Study Evaluating the Safety Pharmacokinetics and Clinical Benefit of FLU-IGIV in Hospitalized Patients with Serious Influenza A infection. IA-001 (ClinicalTrials.gov Identifier: [id number], Protocol No: IA-001, [facility] Protocol #[id number])  RESEARCH SUBJECT. This research study is sponsored by [employer]   ................................................................................................... dw patient abut his interest in above flu study. He said he will look over cpy of consent and d/w wife but he is leaning against study participation  Dr. Kalman ShanMurali Marquel Spoto, M.D., [facility]P Pulmonary and Critical Care Medicine Staff Physician, [facility] Director - Interstitial Lung Disease  Program  Pulmonary Fibrosis [facility] [place], [place], [id number]  Pager: [phone], If no answer or between  15:00h - 7:00h: call [phone] Telephone: [phone]

## 2017-05-07 NOTE — ED Notes (Signed)
Heart Healthy Diet was ordered for Lunch. 

## 2017-05-07 NOTE — ED Notes (Signed)
Admitting at bedside 

## 2017-05-07 NOTE — Progress Notes (Signed)
Patient admitted after midnight, please see H&P.  Flu A positive.  Patient reported abnormal EKG at PCP-- CE negative x 1, echo pending.  PRN nebulizer for wheezing. Suspected sleep apnea but never test will need to follow up with PCP.  Marlin CanaryJessica Tomi Grandpre DO

## 2017-05-08 ENCOUNTER — Other Ambulatory Visit (HOSPITAL_COMMUNITY): Payer: BLUE CROSS/BLUE SHIELD

## 2017-05-08 DIAGNOSIS — I10 Essential (primary) hypertension: Secondary | ICD-10-CM | POA: Diagnosis not present

## 2017-05-08 DIAGNOSIS — J101 Influenza due to other identified influenza virus with other respiratory manifestations: Secondary | ICD-10-CM | POA: Diagnosis not present

## 2017-05-08 DIAGNOSIS — J9601 Acute respiratory failure with hypoxia: Secondary | ICD-10-CM | POA: Diagnosis not present

## 2017-05-08 LAB — BASIC METABOLIC PANEL
ANION GAP: 10 (ref 5–15)
BUN: 10 mg/dL (ref 6–20)
CALCIUM: 8.7 mg/dL — AB (ref 8.9–10.3)
CO2: 23 mmol/L (ref 22–32)
Chloride: 107 mmol/L (ref 101–111)
Creatinine, Ser: 0.91 mg/dL (ref 0.61–1.24)
GLUCOSE: 131 mg/dL — AB (ref 65–99)
Potassium: 4.2 mmol/L (ref 3.5–5.1)
Sodium: 140 mmol/L (ref 135–145)

## 2017-05-08 MED ORDER — OSELTAMIVIR PHOSPHATE 75 MG PO CAPS: 75.0000 mg | ORAL_CAPSULE | Freq: Two times a day (BID) | ORAL | 0 refills | Status: AC

## 2017-05-08 MED ORDER — ACETAMINOPHEN 500 MG PO TABS: 500.0000 mg | ORAL_TABLET | Freq: Four times a day (QID) | ORAL | 0 refills | Status: DC | PRN

## 2017-05-08 NOTE — Progress Notes (Signed)
Pt resting during night no signs distress or coughing noted.  Pt states he feels better.

## 2017-05-08 NOTE — Discharge Summary (Signed)
Physician Discharge Summary  Jonathan ArbourJames A Riley ZOX:[id number] DOB: 1960/09/14 DOA: 05/06/2017  PCP: Blair HeysEhinger, Robert, MD  Admit date: 05/06/2017 Discharge date: 05/08/2017  Admitted From: Home Disposition:  Home  Recommendations for Outpatient Follow-up:  1. Follow up with PCP in 1- week 2. Patient will be discharged with Oseltamivir  Home Health: no  Equipment/Devices: no   Discharge Condition: Stable CODE STATUS: full  Diet recommendation: Heart healthy  Brief/Interim Summary: 57 year old male who presented with dyspnea. Patient does have a significant past medical history for hypertension. He complained of worsening dyspnea for last 24 hours prior to hospitalization, associated with cough, dizziness, weakness and nausea. Positive subjective fevers and chills. On his initial physical examination his temperature was 101.5 F, blood pressure 129/69, heart rate 86, respiratory rate 27, oxygen saturation 92% on supplemental oxygen. Moist mucous membranes, lungs with expiratory wheezing, no rales or rhonchi, heart S1-S2 present tachycardic, abdomen soft nontender, no lower extremity edema. Sodium 135, potassium 4.0, chloride 99, bicarbonate 22, glucose 143, BUN 9, creatinine 0.85, BNP 13.4, lactic acid 1.2, troponin 0.01, white count 9.7, hemoglobin 15.8, hematocrit 35.9, platelets 182. Chest x-ray with right rotation, hypoinflation, increased vascular markings bilaterally. CT chest negative for pulmonary embolism, no infiltrates. Influenza A+, EKG sinus rhythm, 101 bpm, normal axis, LVH per EKG criteria, J-point elevation on septal precordial leads V1 to V3.   Patient was admitted to the hospital with a working diagnosis acute hypoxic respiratory failure due to acute influenza A infection.  1. Acute hypoxic respiratory failure due to influenza A. Patient was admitted to the medical unit, placed on a remote telemetry monitor, received supportive medical care, including supplemental oxygen per nasal  cannula and antibiotic therapy with Oseltamivir. He has responded well to medical therapy with significant improvement of his symptoms. His oxygen saturation at the time of discharge is 94-98% on room air. Patient will complete Oseltamivir as an outpatient.  2. Hypertension. Blood pressure remained well-controlled with amlodipine and irbesartan, systolic blood pressure 143 to 155 at discharge. 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   3. Suspected obstructive sleep apnea. Follow-up as an outpatient, may need sleep study.   4. Obesity. BMI 36.4, lifestyle modifications, outpatient follow-up.    Discharge Diagnoses:  Principal Problem:   Acute respiratory failure with hypoxia [facility](HCC) Active Problems:   Dyspnea   Influenza A   Essential hypertension    Discharge Instructions   Allergies as of 05/08/2017      Reactions   Percocet [oxycodone-acetaminophen] Nausea And Vomiting      Medication List    STOP taking these medications   DAYQUIL MULTI-SYMPTOM COLD/FLU PO   docusate sodium 100 MG capsule Commonly known as:  COLACE   HYDROcodone-acetaminophen 5-325 MG tablet Commonly known as:  NORCO/VICODIN   methocarbamol 500 MG tablet Commonly known as:  ROBAXIN   ondansetron 4 MG tablet Commonly known as:  ZOFRAN   senna 8.6 MG Tabs tablet Commonly known as:  SENOKOT     TAKE these medications   acetaminophen 500 MG tablet Commonly known as:  TYLENOL Take 1 tablet (500 mg total) by mouth every 6 (six) hours as needed for fever.   amLODipine 5 MG tablet Commonly known as:  NORVASC Take 5 mg by mouth daily.   aspirin EC 325 MG tablet Take 1-2 tablets (325-650 mg total) by mouth 2 (two) times daily with a meal. What changed:    how much to take  when to take this   CIALIS 20  MG tablet Generic drug:  tadalafil Take 20 mg by mouth daily as needed for erectile dysfunction.   oseltamivir 75 MG capsule Commonly known as:   TAMIFLU Take 1 capsule (75 mg total) by mouth 2 (two) times daily for 4 days.   telmisartan 80 MG tablet Commonly known as:  MICARDIS Take 80 mg by mouth daily.   VITAMIN B-12 PO Take 1 tablet by mouth daily.       Allergies  Allergen Reactions  . Percocet [Oxycodone-Acetaminophen] Nausea And Vomiting    Consultations:     Procedures/Studies: Dg Chest 2 View  Result Date: 05/06/2017 CLINICAL DATA:  Acute presentation with shortness of breath over the last day. Abnormal EKG. EXAM: CHEST  2 VIEW COMPARISON:  02/13/2017 FINDINGS: Heart size is normal. There is aortic atherosclerosis. Question mild venous hypertension without frank edema. No infiltrate, collapse or effusion. No bone abnormality. IMPRESSION: No definite active finding. Aortic atherosclerosis. Question mild venous hypertension. Electronically Signed   By: Paulina Fusi M.D.   On: 05/06/2017 17:59   Ct Angio Chest Pe W And/or Wo Contrast  Result Date: 05/06/2017 CLINICAL DATA:  57 year old male with shortness of breath. EXAM: CT ANGIOGRAPHY CHEST WITH CONTRAST TECHNIQUE: Multidetector CT imaging of the chest was performed using the standard protocol during bolus administration of intravenous contrast. Multiplanar CT image reconstructions and MIPs were obtained to evaluate the vascular anatomy. CONTRAST:  ISOVUE-370 IOPAMIDOL (ISOVUE-370) INJECTION 76% COMPARISON:  Chest radiograph dated 05/06/2017 FINDINGS: Cardiovascular: There is mild cardiomegaly. Multi vessel coronary vascular calcification. There is no pericardial effusion. Mild atherosclerotic calcification of the thoracic aorta. Mild the origins of the great vessels of the aortic arch are patent. Evaluation of the pulmonary arteries is limited due to respiratory motion artifact and suboptimal enhancement of the peripheral branches. No large or central pulmonary artery embolus identified. Mediastinum/Nodes: No hilar or mediastinal adenopathy. The esophagus is  grossly unremarkable. No mediastinal fluid collection. Lungs/Pleura: Right lung base linear atelectatic changes noted. There is no focal consolidation, pleural effusion, or pneumothorax. The central airways are patent. Upper Abdomen: Diffuse fatty infiltration of the liver. The visualized upper abdomen is otherwise unremarkable. Musculoskeletal: No chest wall abnormality. No acute or significant osseous findings. Review of the MIP images confirms the above findings. IMPRESSION: 1. No acute intrathoracic pathology. No CT evidence of central pulmonary artery embolus. 2. Mild cardiomegaly and coronary vascular calcification. 3. Right lung base linear atelectasis. 4. Fatty liver. 5. Mild Aortic Atherosclerosis (ICD10-I70.0). Electronically Signed   By: Elgie Collard M.D.   On: 05/06/2017 23:53       Subjective: Patient feeling better, no nausea or vomiting, no dyspnea or chest pain. Patient has been afebrile for more than 12 hours.   Discharge Exam: Vitals:   05/07/17 2007 05/08/17 0638  BP: (!) 155/83 (!) 143/81  Pulse: 88 78  Resp: 18 19  Temp: (!) 100.4 F (38 C) 98.6 F (37 C)  SpO2: 94% 93%   Vitals:   05/07/17 1350 05/07/17 1624 05/07/17 2007 05/08/17 0638  BP: (!) 155/70  (!) 155/83 (!) 143/81  Pulse: 81  88 78  Resp:   18 19  Temp: 99 F (37.2 C) (!) 100.6 F (38.1 C) (!) 100.4 F (38 C) 98.6 F (37 C)  TempSrc:  Oral Oral Oral  SpO2: 94% 94% 94% 93%  Weight: 116.1 kg (256 lb 0.8 oz)   115.3 kg (254 lb 3.1 oz)  Height: 5\' 10"  (1.778 m)   5\' 10"  (1.778 m)  General: Pt is alert, awake, not in acute distress E ENT: no pallor or icterus.  Cardiovascular: RRR, S1/S2 +, no rubs, no gallops Respiratory: CTA bilaterally, no wheezing, no rhonchi Abdominal: Soft, protuberant, NT, ND, bowel sounds + Extremities: no edema, no cyanosis    The results of significant diagnostics from this hospitalization (including imaging, microbiology, ancillary and laboratory) are listed  below for reference.     Microbiology: No results found for this or any previous visit (from the past 240 hour(s)).   Labs: BNP (last 3 results) Recent Labs    05/06/17 2234  BNP 13.4   Basic Metabolic Panel: Recent Labs  Lab 05/06/17 1732 05/07/17 0435 05/08/17 0643  NA 135 133* 140  K 4.0 3.8 4.2  CL 99* 99* 107  CO2 22 21* 23  GLUCOSE 143* 139* 131*  BUN 9 11 10   CREATININE 0.85 0.90 0.91  CALCIUM 9.3 8.8* 8.7*   Liver Function Tests: No results for input(s): AST, ALT, ALKPHOS, BILITOT, PROT, ALBUMIN in the last 168 hours. No results for input(s): LIPASE, AMYLASE in the last 168 hours. No results for input(s): AMMONIA in the last 168 hours. CBC: Recent Labs  Lab 05/06/17 1732 05/07/17 0435  WBC 9.7 6.6  HGB 15.8 14.5  HCT 45.9 43.6  MCV 78.3 78.4  PLT 182 161   Cardiac Enzymes: No results for input(s): CKTOTAL, CKMB, CKMBINDEX, TROPONINI in the last 168 hours. BNP: Invalid input(s): POCBNP CBG: No results for input(s): GLUCAP in the last 168 hours. D-Dimer No results for input(s): DDIMER in the last 72 hours. Hgb A1c No results for input(s): HGBA1C in the last 72 hours. Lipid Profile No results for input(s): CHOL, HDL, LDLCALC, TRIG, CHOLHDL, LDLDIRECT in the last 72 hours. Thyroid function studies Recent Labs    05/07/17 0435  TSH 0.663   Anemia work up No results for input(s): VITAMINB12, FOLATE, FERRITIN, TIBC, IRON, RETICCTPCT in the last 72 hours. Urinalysis No results found for: COLORURINE, APPEARANCEUR, LABSPEC, PHURINE, GLUCOSEU, HGBUR, BILIRUBINUR, KETONESUR, PROTEINUR, UROBILINOGEN, NITRITE, LEUKOCYTESUR Sepsis Labs Invalid input(s): PROCALCITONIN,  WBC,  LACTICIDVEN Microbiology No results found for this or any previous visit (from the past 240 hour(s)).   Time coordinating discharge: 45 minutes  SIGNED:   Coralie Keens, MD  [facility] 05/08/2017, 8:56 AM Pager [phone]  If 7PM-7AM, please contact  night-coverage [email or web address] Password [email or web address]

## 2017-05-09 LAB — LEGIONELLA PNEUMOPHILA SEROGP 1 UR AG: L. PNEUMOPHILA SEROGP 1 UR AG: NEGATIVE

## 2017-05-12 LAB — CULTURE, BLOOD (ROUTINE X 2)
CULTURE: NO GROWTH
CULTURE: NO GROWTH
SPECIAL REQUESTS: ADEQUATE
Special Requests: ADEQUATE

## 2018-06-29 ENCOUNTER — Ambulatory Visit
Admission: RE | Admit: 2018-06-29 | Discharge: 2018-06-29 | Disposition: A | Discharge status: Home / Self Care | Payer: BLUE CROSS/BLUE SHIELD | Source: Ambulatory Visit | Attending: Otolaryngology | Admitting: Otolaryngology

## 2018-06-29 ENCOUNTER — Other Ambulatory Visit: Payer: Self-pay | Admitting: Otolaryngology

## 2018-06-29 DIAGNOSIS — R05 Cough: Secondary | ICD-10-CM

## 2018-06-29 DIAGNOSIS — R053 Chronic cough: Secondary | ICD-10-CM

## 2020-08-15 ENCOUNTER — Ambulatory Visit: Payer: BC Managed Care – PPO | Admitting: Psychologist

## 2020-08-15 ENCOUNTER — Ambulatory Visit (INDEPENDENT_AMBULATORY_CARE_PROVIDER_SITE_OTHER): Payer: BC Managed Care – PPO | Admitting: Psychologist

## 2020-08-15 DIAGNOSIS — F4322 Adjustment disorder with anxiety: Secondary | ICD-10-CM

## 2021-04-13 ENCOUNTER — Encounter (HOSPITAL_COMMUNITY): Payer: Self-pay

## 2021-04-13 ENCOUNTER — Emergency Department (HOSPITAL_COMMUNITY)
Admission: EM | Admit: 2021-04-13 | Discharge: 2021-04-14 | Disposition: A | Discharge status: Home / Self Care | Payer: BC Managed Care – PPO | Attending: Emergency Medicine | Admitting: Emergency Medicine

## 2021-04-13 ENCOUNTER — Other Ambulatory Visit: Payer: Self-pay

## 2021-04-13 DIAGNOSIS — J101 Influenza due to other identified influenza virus with other respiratory manifestations: Secondary | ICD-10-CM | POA: Insufficient documentation

## 2021-04-13 DIAGNOSIS — R509 Fever, unspecified: Secondary | ICD-10-CM

## 2021-04-13 DIAGNOSIS — I10 Essential (primary) hypertension: Secondary | ICD-10-CM | POA: Diagnosis not present

## 2021-04-13 DIAGNOSIS — Z7982 Long term (current) use of aspirin: Secondary | ICD-10-CM | POA: Diagnosis not present

## 2021-04-13 DIAGNOSIS — Z96641 Presence of right artificial hip joint: Secondary | ICD-10-CM | POA: Diagnosis not present

## 2021-04-13 DIAGNOSIS — Z87891 Personal history of nicotine dependence: Secondary | ICD-10-CM | POA: Diagnosis not present

## 2021-04-13 DIAGNOSIS — Z20822 Contact with and (suspected) exposure to covid-19: Secondary | ICD-10-CM | POA: Diagnosis not present

## 2021-04-13 DIAGNOSIS — Z79899 Other long term (current) drug therapy: Secondary | ICD-10-CM | POA: Diagnosis not present

## 2021-04-13 DIAGNOSIS — E119 Type 2 diabetes mellitus without complications: Secondary | ICD-10-CM | POA: Insufficient documentation

## 2021-04-13 MED ORDER — HYDROCODONE-ACETAMINOPHEN 5-325 MG PO TABS
1.0000 | ORAL_TABLET | Freq: Once | ORAL | Status: DC
  Filled 2021-04-13: qty 1

## 2021-04-13 MED ORDER — ONDANSETRON HCL 4 MG PO TABS
4.0000 mg | ORAL_TABLET | Freq: Once | ORAL | Status: DC
  Filled 2021-04-13: qty 1

## 2021-04-13 NOTE — ED Triage Notes (Signed)
Pt states Wednesday he had a total left knee replacement by Dr. Luiz Blare. Pt has been going to PT, yesterday pt bled through bandage x2 while at PT. Today pt had a sudden onset of Nausea, chills and hot. Wife states left thigh is red and tight which is new. Pt took his temp at home and was 100.7, pt took ASA 324mg  and Celebrex. Pt is A&Ox4.

## 2021-04-13 NOTE — ED Provider Notes (Signed)
Emergency Medicine Provider Triage Evaluation Note  Jonathan Riley , a 60 y.o. male  was evaluated in triage.  Pt complains of generally not feeling well today. Patient is S/p left knee replacement 04/10/21 by Dr. Luiz Blare, started PT and has had some issues with bleeding through his bandage with subsequent re-dressing. Today however he started to feel poorly with some subjective fever/chills and nausea. Has noted the left thigh is somewhat red/tight, no calf pain/redness.   Review of Systems  Positive: Color change, myalgias, nausea, subjective fever/chills, some increased urinary frequency as well.  Negative: Chest pain, shortness of breath, hemoptysis, dysuria.   Physical Exam  BP (!) 164/88    Pulse 96    Temp 98.7 F (37.1 C)    Resp 20    Ht 5\' 10"  (1.778 m)    Wt 108.9 kg    SpO2 99%    BMI 34.44 kg/m  Gen:   Awake, no distress   Resp:  Normal effort  MSK:   Mild erythema to the left thigh with some bruising to the lateral thigh, surgical incision site to left anterior knee without active bleeding or purulent drainage, trace edema to the left lower leg. Tender to the left anterior thigh somewhat. No calf tenderness. <2 sec cap refill &palpable DP pulse.   Medical Decision Making  Medically screening exam initiated at 11:56 PM.  Appropriate orders placed.  Nayel A Atlas was informed that the remainder of the evaluation will be completed by another provider, this initial triage assessment does not replace that evaluation, and the importance of remaining in the ED until their evaluation is complete.     Fayrene Fearing, PA-C 04/14/21 0001    04/16/21, MD 04/14/21 0400

## 2021-04-14 ENCOUNTER — Emergency Department (HOSPITAL_COMMUNITY): Payer: BC Managed Care – PPO

## 2021-04-14 LAB — URINALYSIS, ROUTINE W REFLEX MICROSCOPIC
Bacteria, UA: NONE SEEN
Bilirubin Urine: NEGATIVE
Glucose, UA: NEGATIVE mg/dL
Hgb urine dipstick: NEGATIVE
Ketones, ur: NEGATIVE mg/dL
Leukocytes,Ua: NEGATIVE
Nitrite: NEGATIVE
Protein, ur: 30 mg/dL — AB
Specific Gravity, Urine: 1.015 (ref 1.005–1.030)
pH: 7 (ref 5.0–8.0)

## 2021-04-14 LAB — CBC WITH DIFFERENTIAL/PLATELET
Abs Immature Granulocytes: 0.07 10*3/uL (ref 0.00–0.07)
Basophils Absolute: 0 10*3/uL (ref 0.0–0.1)
Basophils Relative: 0 %
Eosinophils Absolute: 0.1 10*3/uL (ref 0.0–0.5)
Eosinophils Relative: 1 %
HCT: 40.9 % (ref 39.0–52.0)
Hemoglobin: 13.7 g/dL (ref 13.0–17.0)
Immature Granulocytes: 1 %
Lymphocytes Relative: 19 %
Lymphs Abs: 2 10*3/uL (ref 0.7–4.0)
MCH: 26.8 pg (ref 26.0–34.0)
MCHC: 33.5 g/dL (ref 30.0–36.0)
MCV: 80 fL (ref 80.0–100.0)
Monocytes Absolute: 1.1 10*3/uL — ABNORMAL HIGH (ref 0.1–1.0)
Monocytes Relative: 10 %
Neutro Abs: 7.3 10*3/uL (ref 1.7–7.7)
Neutrophils Relative %: 69 %
Platelets: 221 10*3/uL (ref 150–400)
RBC: 5.11 MIL/uL (ref 4.22–5.81)
RDW: 14 % (ref 11.5–15.5)
WBC: 10.5 10*3/uL (ref 4.0–10.5)
nRBC: 0 % (ref 0.0–0.2)

## 2021-04-14 LAB — COMPREHENSIVE METABOLIC PANEL
ALT: 29 U/L (ref 0–44)
AST: 21 U/L (ref 15–41)
Albumin: 3.9 g/dL (ref 3.5–5.0)
Alkaline Phosphatase: 60 U/L (ref 38–126)
Anion gap: 9 (ref 5–15)
BUN: 15 mg/dL (ref 6–20)
CO2: 25 mmol/L (ref 22–32)
Calcium: 9.1 mg/dL (ref 8.9–10.3)
Chloride: 101 mmol/L (ref 98–111)
Creatinine, Ser: 0.86 mg/dL (ref 0.61–1.24)
GFR, Estimated: >60 mL/min (ref >60)
Glucose, Bld: 139 mg/dL — ABNORMAL HIGH (ref 70–99)
Potassium: 3.9 mmol/L (ref 3.5–5.1)
Sodium: 135 mmol/L (ref 135–145)
Total Bilirubin: 1 mg/dL (ref 0.3–1.2)
Total Protein: 7.5 g/dL (ref 6.5–8.1)

## 2021-04-14 LAB — RESP PANEL BY RT-PCR (FLU A&B, COVID) ARPGX2
Influenza A by PCR: NEGATIVE
Influenza B by PCR: NEGATIVE
SARS Coronavirus 2 by RT PCR: NEGATIVE

## 2021-04-14 NOTE — ED Provider Notes (Signed)
[facility]-EMERGENCY DEPT Provider Note   CSN: [id number] Arrival date & time: 04/13/21  2220     History Chief Complaint  Patient presents with   Post-op Problem    Jonathan Riley is a 60 y.o. male.  The history is provided by the patient.  He has history of hypertension, diabetes and had a left knee total arthroplasty done 4 days ago.  Since then, he has had some bleeding from the incision and has had to have dressing replaced twice.  Tonight, he felt slightly nauseous and developed a fever to 100.5.  He denies chills or sweats.  He denies rhinorrhea, sore throat, cough.  There has been no vomiting or diarrhea.  He has not noticed any change in the postoperative pain he will had been having, nor has there been any change in the amount of swelling in his knee or leg.   Past Medical History:  Diagnosis Date   Arthritis    Depression    Diabetes mellitus without complication (HCC)    Dyspnea    "with exertion"   Hypertension    Nocturia    1-2 times during night   PONV (postoperative nausea and vomiting)    "nausea after a knee surgery"    Patient Active Problem List   Diagnosis Date Noted   Dyspnea 05/07/2017   Acute respiratory failure with hypoxia (HCC) 05/07/2017   Influenza A 05/07/2017   Essential hypertension 05/07/2017   Primary osteoarthritis of right hip 03/03/2016   Alteration of consciousness 02/21/2013    Past Surgical History:  Procedure Laterality Date   COLONOSCOPY     KNEE SURGERY Bilateral    Right, Left x2   TOTAL HIP ARTHROPLASTY Right 03/03/2016   Procedure: TOTAL HIP ARTHROPLASTY ANTERIOR APPROACH;  Surgeon: Samson Frederic, MD;  Location: [facility] OR;  Service: Orthopedics;  Laterality: Right;   WRIST SURGERY Right 2008 or 2009       Family History  Problem Relation Age of Onset   Heart disease Unknown    Diabetes Unknown    Cancer Unknown     Social History   Tobacco Use   Smoking status: Former    Types:  Cigarettes   Smokeless tobacco: Never   Tobacco comments:    "quit about 12 years ago" 02/26/2016  Substance Use Topics   Alcohol use: No   Drug use: No    Home Medications Prior to Admission medications   Medication Sig Start Date End Date Taking? Authorizing Provider  acetaminophen (TYLENOL) 500 MG tablet Take 1 tablet (500 mg total) by mouth every 6 (six) hours as needed for fever. 05/08/17   Arrien, York Ram, MD  amLODipine (NORVASC) 5 MG tablet Take 5 mg by mouth daily. 01/31/16   [provider]  aspirin EC 325 MG tablet Take 1-2 tablets (325-650 mg total) by mouth 2 (two) times daily with a meal. Patient taking differently: Take 650 mg by mouth daily.  03/04/16   Swinteck, Arlys John, MD  CIALIS 20 MG tablet Take 20 mg by mouth daily as needed for erectile dysfunction.  02/12/13   [provider]  Cyanocobalamin (VITAMIN B-12 PO) Take 1 tablet by mouth daily.    [provider]  telmisartan (MICARDIS) 80 MG tablet Take 80 mg by mouth daily.    [provider]    Allergies    Percocet [oxycodone-acetaminophen]  Review of Systems   Review of Systems  All other systems reviewed and are negative.  Physical  Exam Updated Vital Signs BP (!) 148/84 (BP Location: Left Arm)    Pulse 77    Temp 98.9 F (37.2 C) (Oral)    Resp 20    Ht 5\' 10"  (1.778 m)    Wt 108.9 kg    SpO2 97%    BMI 34.44 kg/m   Physical Exam Vitals and nursing note reviewed.  60 year old male, resting comfortably and in no acute distress. Vital signs are significant for elevated blood pressure. Oxygen saturation is 97%, which is normal. Head is normocephalic and atraumatic. PERRLA, EOMI. Oropharynx is clear. Lungs are clear without rales, wheezes, or rhonchi. Chest is nontender. Heart has regular rate and rhythm without murmur. Abdomen is soft, flat, nontender. Extremities: Incision present over the anterior aspect of the left knee from recent total knee arthroplasty.   There is mild swelling noted and mild ecchymosis consistent with what would be expected 4 days postoperatively.  There is no drainage from the incision nor any evidence of any dehiscence.  The knee is slightly warm but not hot to the touch.  Ecchymosis is present consistent with recent surgery. Skin is warm and dry without rash. Neurologic: Mental status is normal, cranial nerves are intact, moves all extremities equally. ED Results / Procedures / Treatments   Labs (all labs ordered are listed, but only abnormal results are displayed) Labs Reviewed  CBC WITH DIFFERENTIAL/PLATELET - Abnormal; Notable for the following components:      Result Value   Monocytes Absolute 1.1 (*)    All other components within normal limits  COMPREHENSIVE METABOLIC PANEL - Abnormal; Notable for the following components:   Glucose, Bld 139 (*)    All other components within normal limits  URINALYSIS, ROUTINE W REFLEX MICROSCOPIC - Abnormal; Notable for the following components:   Color, Urine YELLOW (*)    APPearance CLEAR (*)    Protein, ur 30 (*)    All other components within normal limits  RESP PANEL BY RT-PCR (FLU A&B, COVID) [id number]   Radiology DG Chest 2 View  Result Date: 04/14/2021 CLINICAL DATA:  Fevers following knee replacement, initial encounter EXAM: CHEST - 2 VIEW COMPARISON:  06/29/2018 FINDINGS: Cardiac shadow is within normal limits. The lungs are clear bilaterally. No bony abnormality is seen. IMPRESSION: No active cardiopulmonary disease. Electronically Signed   By: 08/29/2018 M.D.   On: 04/14/2021 00:31    Procedures Procedures   Medications Ordered in ED Medications  ondansetron (ZOFRAN) tablet 4 mg (has no administration in time range)  HYDROcodone-acetaminophen (NORCO/VICODIN) 5-325 MG per tablet 1 tablet (has no administration in time range)    ED Course  I have reviewed the triage vital signs and the nursing notes.  Pertinent labs & imaging results that were available  during my care of the patient were reviewed by me and considered in my medical decision making (see chart for details).   MDM Rules/Calculators/A&P                         Fever in patient who is 4 days post left knee total arthroplasty.  I suspect that this is a viral infection.  Chest x-ray showed no evidence of pneumonia.  Respiratory pathogen panel was negative for influenza and COVID-19.  WBC is normal with normal differential.  The knee does not appear infected.  He is advised to take acetaminophen or ibuprofen as needed for fever and continue his physical therapy.  Advised to see his  orthopedic physician as soon as possible for reevaluation, return if he starts having drainage or increased redness or swelling.  Old records were reviewed, but his operative record is not in our system.  Final Clinical Impression(s) / ED Diagnoses Final diagnoses:  Fever in adult  Elevated blood pressure reading with diagnosis of hypertension    Rx / DC Orders ED Discharge Orders     None        Dione Booze, MD 04/14/21 [phone]

## 2021-04-14 NOTE — Discharge Instructions (Signed)
You may take ibuprofen or acetaminophen as needed for fever.  If you develop any drainage from your incision other than blood, or if your knee starts getting more swollen or painful, then see your orthopedic physician or return to the emergency department.  Continue doing your physical therapy.

## 2021-04-14 NOTE — ED Notes (Signed)
Applied xeroform dressing to incision site, covered with abd pad, wrapped with ace bandage.

## 2021-05-17 DIAGNOSIS — I10 Essential (primary) hypertension: Secondary | ICD-10-CM | POA: Diagnosis not present

## 2021-06-04 DIAGNOSIS — M25562 Pain in left knee: Secondary | ICD-10-CM | POA: Diagnosis not present

## 2021-06-04 DIAGNOSIS — Z9889 Other specified postprocedural states: Secondary | ICD-10-CM | POA: Diagnosis not present

## 2021-07-02 DIAGNOSIS — M25561 Pain in right knee: Secondary | ICD-10-CM | POA: Diagnosis not present

## 2021-07-29 DIAGNOSIS — M25562 Pain in left knee: Secondary | ICD-10-CM | POA: Diagnosis not present

## 2021-10-24 DIAGNOSIS — E1169 Type 2 diabetes mellitus with other specified complication: Secondary | ICD-10-CM | POA: Diagnosis not present

## 2021-10-24 DIAGNOSIS — R0609 Other forms of dyspnea: Secondary | ICD-10-CM | POA: Diagnosis not present

## 2021-10-24 DIAGNOSIS — Z96652 Presence of left artificial knee joint: Secondary | ICD-10-CM | POA: Diagnosis not present

## 2021-10-24 DIAGNOSIS — N529 Male erectile dysfunction, unspecified: Secondary | ICD-10-CM | POA: Diagnosis not present

## 2021-10-24 DIAGNOSIS — M25562 Pain in left knee: Secondary | ICD-10-CM | POA: Diagnosis not present

## 2021-10-24 DIAGNOSIS — M25462 Effusion, left knee: Secondary | ICD-10-CM | POA: Diagnosis not present

## 2021-10-24 DIAGNOSIS — I10 Essential (primary) hypertension: Secondary | ICD-10-CM | POA: Diagnosis not present

## 2021-10-24 DIAGNOSIS — E782 Mixed hyperlipidemia: Secondary | ICD-10-CM | POA: Diagnosis not present

## 2021-11-14 ENCOUNTER — Encounter (HOSPITAL_BASED_OUTPATIENT_CLINIC_OR_DEPARTMENT_OTHER): Payer: Self-pay | Admitting: Cardiology

## 2021-11-14 ENCOUNTER — Ambulatory Visit (INDEPENDENT_AMBULATORY_CARE_PROVIDER_SITE_OTHER): Payer: BC Managed Care – PPO | Admitting: Cardiology

## 2021-11-14 VITALS — BP 144/74 | HR 68 | Ht 70.0 in | Wt 247.8 lb

## 2021-11-14 DIAGNOSIS — R9431 Abnormal electrocardiogram [ECG] [EKG]: Secondary | ICD-10-CM | POA: Diagnosis not present

## 2021-11-14 DIAGNOSIS — I2510 Atherosclerotic heart disease of native coronary artery without angina pectoris: Secondary | ICD-10-CM | POA: Diagnosis not present

## 2021-11-14 DIAGNOSIS — Z789 Other specified health status: Secondary | ICD-10-CM

## 2021-11-14 DIAGNOSIS — Z01812 Encounter for preprocedural laboratory examination: Secondary | ICD-10-CM

## 2021-11-14 DIAGNOSIS — I208 Other forms of angina pectoris: Secondary | ICD-10-CM | POA: Diagnosis not present

## 2021-11-14 DIAGNOSIS — R072 Precordial pain: Secondary | ICD-10-CM

## 2021-11-14 DIAGNOSIS — R0609 Other forms of dyspnea: Secondary | ICD-10-CM

## 2021-11-14 DIAGNOSIS — E7800 Pure hypercholesterolemia, unspecified: Secondary | ICD-10-CM

## 2021-11-14 DIAGNOSIS — Z8249 Family history of ischemic heart disease and other diseases of the circulatory system: Secondary | ICD-10-CM | POA: Diagnosis not present

## 2021-11-14 DIAGNOSIS — Z7189 Other specified counseling: Secondary | ICD-10-CM

## 2021-11-14 MED ORDER — METOPROLOL TARTRATE 50 MG PO TABS: ORAL_TABLET | ORAL | 0 refills | Status: DC

## 2021-11-14 NOTE — Progress Notes (Signed)
Cardiology Office Note:    Date:  11/15/2021   ID:  Jonathan Riley, DOB [birth year], MRN [id number]  PCP:  Blair Heys, MD  Cardiologist:  Jodelle Red, MD  Referring MD: Blair Heys, MD   CC: new patient consultation for dyspnea on exertion  History of Present Illness:    Jonathan Riley is a 61 y.o. male with a hx of hypertension, type II diabetes, hyperlipidemia who is seen as a new consult at the request of Blair Heys, MD for the evaluation and management of dyspnea on exertion.  Referral request from Dr. Manus Gunning dated 10/29/21 notes dyspnea on exertion, with history of hypertension, diabetes, and hyperlipidemia.  Cardiovascular risk factors: Prior clinical ASCVD: none Comorbid conditions: hypertension--about 7-8 years, hyperlipidemia--was on a statin a few years ago (doesn't recall which one), had joint aches which improved off statin, diabetes--few years. Denies chronic kidney disease  Metabolic syndrome/Obesity: BMI 35 Chronic inflammatory conditions: none Tobacco use history: former smoker, smoked about 23 years, quit, then started back for a few years, then quit again. Family history: father had MI age 74. Maternal grandfather had several MI. Paternal uncle died of MI as well. Maternal grandmother had a stroke. Maternal aunt had a stroke.Brother was a Arts development officer, has Parkinson's, HTN, DM. Prior pertinent testing and/or incidental findings: coronary calcium/aortic atherosclerosis seen on CTPE 04/2017. Exercise level: works for [employer], climbs poles. Had knee surgery in December, was out of work for 4 mos, thought the shortness of breath would get better getting back to activity but it has not. Doesn't recall having this before knee surgery.  Notes that when he is walking or being active, he gets shortness of breath and gets a pain in his throat.   He was under the impression that he was going to have a stress test today.   Denies chest pain, shortness of breath  at rest. No PND, orthopnea, LE edema or unexpected weight gain. No syncope or palpitations.   Past Medical History:  Diagnosis Date   Arthritis    Depression    Diabetes mellitus without complication (HCC)    Dyspnea    "with exertion"   Hypertension    Nocturia    1-2 times during night   PONV (postoperative nausea and vomiting)    "nausea after a knee surgery"    Past Surgical History:  Procedure Laterality Date   COLONOSCOPY     KNEE SURGERY Bilateral    Right, Left x2   TOTAL HIP ARTHROPLASTY Right 03/03/2016   Procedure: TOTAL HIP ARTHROPLASTY ANTERIOR APPROACH;  Surgeon: Samson Frederic, MD;  Location: [facility] OR;  Service: Orthopedics;  Laterality: Right;   WRIST SURGERY Right 2008 or 2009    Current Medications: Current Outpatient Medications on File Prior to Visit  Medication Sig   amLODipine (NORVASC) 5 MG tablet Take 5 mg by mouth daily.   aspirin EC 325 MG tablet Take 1-2 tablets (325-650 mg total) by mouth 2 (two) times daily with a meal. (Patient taking differently: Take 650 mg by mouth daily.)   CIALIS 20 MG tablet Take 20 mg by mouth daily as needed for erectile dysfunction.    metFORMIN (GLUCOPHAGE-XR) 500 MG 24 hr tablet Take 500 mg by mouth daily.   telmisartan (MICARDIS) 80 MG tablet Take 80 mg by mouth daily.   No current facility-administered medications on file prior to visit.     Allergies:   Percocet [oxycodone-acetaminophen]   Social History   Tobacco Use   Smoking status: Former  Types: Cigarettes   Smokeless tobacco: Never   Tobacco comments:    "quit about 12 years ago" 02/26/2016  Substance Use Topics   Alcohol use: No   Drug use: No    Family History: family history includes Cancer in his unknown relative; Diabetes in his unknown relative; Heart disease in his unknown relative.  ROS:   Please see the history of present illness.  Additional pertinent ROS: Constitutional: Negative for chills, fever, night sweats, unintentional weight  loss  HENT: Negative for ear pain and hearing loss.   Eyes: Negative for loss of vision and eye pain.  Respiratory: Negative for cough, sputum, wheezing.   Cardiovascular: See HPI. Gastrointestinal: Negative for abdominal pain, melena, and hematochezia.  Genitourinary: Negative for dysuria and hematuria.  Musculoskeletal: Negative for falls and myalgias.  Skin: Negative for itching and rash.  Neurological: Negative for focal weakness, focal sensory changes and loss of consciousness.  Endo/Heme/Allergies: Does not bruise/bleed easily.     EKGs/Labs/Other Studies Reviewed:    The following studies were reviewed today: No prior cardiac studies  EKG:  EKG is personally reviewed.   11/14/21 Possible ectopic rhythm (unusual p axis), rate 68 bpm, PRWP  Recent Labs: 04/14/2021: ALT 29; BUN 15; Creatinine, Ser 0.86; Hemoglobin 13.7; Platelets 221; Potassium 3.9; Sodium 135  Recent Lipid Panel No results found for: "CHOL", "TRIG", "HDL", "CHOLHDL", "VLDL", "LDLCALC", "LDLDIRECT"  Physical Exam:    VS:  BP (!) 144/74 (BP Location: Left Arm, Patient Position: Sitting, Cuff Size: Large)   Pulse 68   Ht 5\' 10"  (1.778 m)   Wt 247 lb 12.8 oz (112.4 kg)   BMI 35.56 kg/m     Wt Readings from Last 3 Encounters:  11/14/21 247 lb 12.8 oz (112.4 kg)  04/13/21 240 lb (108.9 kg)  05/08/17 254 lb 3.1 oz (115.3 kg)    GEN: Well nourished, well developed in no acute distress HEENT: Normal, moist mucous membranes NECK: No JVD CARDIAC: regular rhythm, normal S1 and S2, no rubs or gallops. No murmur. VASCULAR: Radial and DP pulses 2+ bilaterally. No carotid bruits RESPIRATORY:  Clear to auscultation without rales, wheezing or rhonchi  ABDOMEN: Soft, non-tender, non-distended MUSCULOSKELETAL:  Ambulates independently SKIN: Warm and dry, no edema NEUROLOGIC:  Alert and oriented x 3. No focal neuro deficits noted. PSYCHIATRIC:  Normal affect    ASSESSMENT:    1. Dyspnea on exertion   2. Anginal  equivalent (HCC)   3. Family history of heart disease   4. Coronary artery calcification seen on CT scan   5. Pure hypercholesterolemia   6. Statin intolerance   7. Cardiac risk counseling   8. Counseling on health promotion and disease prevention   9. Precordial pain   10. Pre-procedure lab exam    PLAN:    Dyspnea on exertion Throat pain on exertion -concerning for anginal equivalent given risk factors -discussed further testing, will proceed with coronary CT. Does have prior coronary calcification on CT but not well visualized (nongated). Would prefer anatomic study so will start with CT. One time dose of metoprolol and pretest BMET ordered -counseled on red flag warning signs that need immediate medical attention  Hypercholesterolemia Statin intolerance -reports prior joint aches on statin. Thinks it might have been rosuvastatin but not sure -with coronary calcium, should be on statin. Will use results of CT to determine intensity of treatment. If we cannot get him to tolerate statin, will need to try nexletol or PCSK9i  Cardiac risk counseling and prevention  recommendations: Family history of heart disease -recommend heart healthy/Mediterranean diet, with whole grains, fruits, vegetable, fish, lean meats, nuts, and olive oil. Limit salt. -recommend moderate walking, 3-5 times/week for 30-50 minutes each session. Aim for at least 150 minutes.week. Goal should be pace of 3 miles/hours, or walking 1.5 miles in 30 minutes -recommend avoidance of tobacco products. Avoid excess alcohol. -ASCVD risk score: The ASCVD Risk score (Arnett DK, et al., 2019) failed to calculate for the following reasons:   Cannot find a previous HDL lab   Cannot find a previous total cholesterol lab    Plan for follow up: 4-6 weeks after tests completed  Jodelle Red, MD, PhD, [facility]  [facility] : [facility]    Medication Adjustments/Labs and Tests Ordered: Current medicines are reviewed at  length with the patient today.  Concerns regarding medicines are outlined above.  Orders Placed This Encounter  Procedures   CT CORONARY MORPH W/CTA COR W/SCORE W/CA W/CM &/OR WO/CM   Basic metabolic panel   EKG 12-Lead   ECHOCARDIOGRAM COMPLETE   Meds ordered this encounter  Medications   metoprolol tartrate (LOPRESSOR) 50 MG tablet    Sig: TAKE 1 TABLET 2 HR PRIOR TO CARDIAC PROCEDURE    Dispense:  1 tablet    Refill:  0    Patient Instructions  Medication Instructions:  Your Physician recommend you continue on your current medication as directed.    *If you need a refill on your cardiac medications before your next appointment, please call your pharmacy*   Lab Work: Your provider has recommended lab work 1 week prior to test (BMP). Please have this collected at [facility] [place]. The lab is open 8:00 am - 4:30 pm. Please avoid 12:00p - 1:00p for lunch hour. You do not need an appointment. Please go to [address] [place], [place] [address]. This is in the Primary Care office on the 3rd floor, let them know you are there for blood work and they will direct you to the lab.  If you have labs (blood work) drawn today and your tests are completely normal, you will receive your results only by: MyChart Message (if you have MyChart) OR A paper copy in the mail If you have any lab test that is abnormal or we need to change your treatment, we will call you to review the results.   Testing/Procedures: Your physician has requested that you have an echocardiogram. Echocardiography is a painless test that uses sound waves to create images of your heart. It provides your doctor with information about the size and shape of your heart and how well your heart's chambers and valves are working. This procedure takes approximately one hour. There are no restrictions for this procedure. [address]  Cardiac CT Angiography (CTA), is a special  type of CT scan that uses a computer to produce multi-dimensional views of major blood vessels throughout the body. In CT angiography, a contrast material is injected through an IV to help visualize the blood vessels  [facility]  Follow-Up: At [facility], you and your health needs are our priority.  As part of our continuing mission to provide you with exceptional heart care, we have created designated Provider Care Teams.  These Care Teams include your primary Cardiologist (physician) and Advanced Practice Providers (APPs -  Physician Assistants and Nurse Practitioners) who all work together to provide you with the care you need, when you need it.  We recommend  signing up for the patient portal called "MyChart".  Sign up information is provided on this After Visit Summary.  MyChart is used to connect with patients for Virtual Visits (Telemedicine).  Patients are able to view lab/test results, encounter notes, upcoming appointments, etc.  Non-urgent messages can be sent to your provider as well.   To learn more about what you can do with MyChart, go to [email or web address].    Your next appointment:   4 -6 week(s)  The format for your next appointment:   In Person  Provider:   Jodelle Red, MD{    Your cardiac CT will be scheduled at one of the below locations:   [facility] [address] [place], [place] [address] [phone]  If scheduled at [facility], please arrive at the [facility] En[facility]ance (Entrance C2) of [facility] 30 minutes prior to test start time. You can use the FREE valet parking offered at entrance C (encouraged to control the heart rate for the test)  Proceed to the [facility] Radiology Department (first floor) to check-in and test prep.  All radiology patients and guests should use entrance C2 at [facility], accessed from [facility], even though the hospital's physical address  listed is [address]    If scheduled at [facility], please arrive 15 mins early for check-in and test prep.  Please follow these instructions carefully (unless otherwise directed):  Hold all erectile dysfunction medications at least 3 days (72 hrs) prior to test.  On the Night Before the Test: Be sure to Drink plenty of water. Do not consume any caffeinated/decaffeinated beverages or chocolate 12 hours prior to your test. Do not take any antihistamines 12 hours prior to your test.  On the Day of the Test: Drink plenty of water until 1 hour prior to the test. Do not eat any food 4 hours prior to the test. You may take your regular medications prior to the test.  Take metoprolol (Lopressor) 50 mg two hours prior to test.       After the Test: Drink plenty of water. After receiving IV contrast, you may experience a mild flushed feeling. This is normal. On occasion, you may experience a mild rash up to 24 hours after the test. This is not dangerous. If this occurs, you can take Benadryl 25 mg and increase your fluid intake. If you experience trouble breathing, this can be serious. If it is severe call 911 IMMEDIATELY. If it is mild, please call our office. If you take any of these medications: Glipizide/Metformin, Avandament, Glucavance, please do not take 48 hours after completing test unless otherwise instructed.  We will call to schedule your test 2-4 weeks out understanding that some insurance companies will need an authorization prior to the service being performed.   For non-scheduling related questions, please contact the cardiac imaging nurse navigator should you have any questions/concerns: Rockwell [place], Cardiac Imaging Nurse Navigator Larey Brick, Cardiac Imaging Nurse Navigator [facility] Direct Office Dial: [phone]   For scheduling needs, including cancellations and rescheduling, please call  [place], [phone].       Signed, Jodelle Red, MD PhD 11/15/2021 9:21 AM    [facility]

## 2021-11-14 NOTE — Patient Instructions (Signed)
Medication Instructions:  Your Physician recommend you continue on your current medication as directed.    *If you need a refill on your cardiac medications before your next appointment, please call your pharmacy*   Lab Work: Your provider has recommended lab work 1 week prior to test (BMP). Please have this collected at [facility] [place]. The lab is open 8:00 am - 4:30 pm. Please avoid 12:00p - 1:00p for lunch hour. You do not need an appointment. Please go to [address] [place], [place] [address]. This is in the Primary Care office on the 3rd floor, let them know you are there for blood work and they will direct you to the lab.  If you have labs (blood work) drawn today and your tests are completely normal, you will receive your results only by: MyChart Message (if you have MyChart) OR A paper copy in the mail If you have any lab test that is abnormal or we need to change your treatment, we will call you to review the results.   Testing/Procedures: Your physician has requested that you have an echocardiogram. Echocardiography is a painless test that uses sound waves to create images of your heart. It provides your doctor with information about the size and shape of your heart and how well your heart's chambers and valves are working. This procedure takes approximately one hour. There are no restrictions for this procedure. [address]  Cardiac CT Angiography (CTA), is a special type of CT scan that uses a computer to produce multi-dimensional views of major blood vessels throughout the body. In CT angiography, a contrast material is injected through an IV to help visualize the blood vessels  [facility]  Follow-Up: At [facility], you and your health needs are our priority.  As part of our continuing mission to provide you with exceptional heart care, we have created designated Provider Care Teams.  These Care Teams include  your primary Cardiologist (physician) and Advanced Practice Providers (APPs -  Physician Assistants and Nurse Practitioners) who all work together to provide you with the care you need, when you need it.  We recommend signing up for the patient portal called "MyChart".  Sign up information is provided on this After Visit Summary.  MyChart is used to connect with patients for Virtual Visits (Telemedicine).  Patients are able to view lab/test results, encounter notes, upcoming appointments, etc.  Non-urgent messages can be sent to your provider as well.   To learn more about what you can do with MyChart, go to [email or web address].    Your next appointment:   4 -6 week(s)  The format for your next appointment:   In Person  Provider:   Jodelle Red, MD{    Your cardiac CT will be scheduled at one of the below locations:   [facility] [address] [place], [place] [address] [phone]  If scheduled at [facility], please arrive at the [facility] and Children's Entrance (Entrance C2) of [facility] 30 minutes prior to test start time. You can use the FREE valet parking offered at entrance C (encouraged to control the heart rate for the test)  Proceed to the [facility] Radiology Department (first floor) to check-in and test prep.  All radiology patients and guests should use entrance C2 at [facility], accessed from [facility], even though the hospital's physical address listed is [address]    If scheduled  at [facility], please arrive 15 mins early for check-in and test prep.  Please follow these instructions carefully (unless otherwise directed):  Hold all erectile dysfunction medications at least 3 days (72 hrs) prior to test.  On the Night Before the Test: Be sure to Drink plenty of water. Do not consume any caffeinated/decaffeinated beverages or chocolate 12 hours prior to  your test. Do not take any antihistamines 12 hours prior to your test.  On the Day of the Test: Drink plenty of water until 1 hour prior to the test. Do not eat any food 4 hours prior to the test. You may take your regular medications prior to the test.  Take metoprolol (Lopressor) 50 mg two hours prior to test.       After the Test: Drink plenty of water. After receiving IV contrast, you may experience a mild flushed feeling. This is normal. On occasion, you may experience a mild rash up to 24 hours after the test. This is not dangerous. If this occurs, you can take Benadryl 25 mg and increase your fluid intake. If you experience trouble breathing, this can be serious. If it is severe call 911 IMMEDIATELY. If it is mild, please call our office. If you take any of these medications: Glipizide/Metformin, Avandament, Glucavance, please do not take 48 hours after completing test unless otherwise instructed.  We will call to schedule your test 2-4 weeks out understanding that some insurance companies will need an authorization prior to the service being performed.   For non-scheduling related questions, please contact the cardiac imaging nurse navigator should you have any questions/concerns: [place]well [place], Cardiac Imaging Nurse Navigator Larey Brick, Cardiac Imaging Nurse Navigator [facility] Direct Office Dial: [phone]   For scheduling needs, including cancellations and rescheduling, please call [place], [phone].

## 2021-11-15 ENCOUNTER — Encounter (HOSPITAL_BASED_OUTPATIENT_CLINIC_OR_DEPARTMENT_OTHER): Payer: Self-pay | Admitting: Cardiology

## 2021-11-27 ENCOUNTER — Ambulatory Visit (INDEPENDENT_AMBULATORY_CARE_PROVIDER_SITE_OTHER): Payer: BC Managed Care – PPO

## 2021-11-27 DIAGNOSIS — Z01812 Encounter for preprocedural laboratory examination: Secondary | ICD-10-CM | POA: Diagnosis not present

## 2021-11-27 DIAGNOSIS — R0609 Other forms of dyspnea: Secondary | ICD-10-CM

## 2021-11-27 LAB — BASIC METABOLIC PANEL
BUN/Creatinine Ratio: 18 (ref 10–24)
BUN: 18 mg/dL (ref 8–27)
CO2: 19 mmol/L — ABNORMAL LOW (ref 20–29)
Calcium: 9.3 mg/dL (ref 8.6–10.2)
Chloride: 101 mmol/L (ref 96–106)
Creatinine, Ser: 1.01 mg/dL (ref 0.76–1.27)
Glucose: 180 mg/dL — ABNORMAL HIGH (ref 70–99)
Potassium: 4.4 mmol/L (ref 3.5–5.2)
Sodium: 137 mmol/L (ref 134–144)
eGFR: 85 mL/min/{1.73_m2} (ref >59)

## 2021-11-29 ENCOUNTER — Encounter (HOSPITAL_COMMUNITY): Payer: Self-pay

## 2021-12-02 ENCOUNTER — Telehealth (HOSPITAL_COMMUNITY): Payer: Self-pay | Admitting: *Deleted

## 2021-12-02 NOTE — Telephone Encounter (Signed)
Patient calling about upcoming cardiac imaging study; pt verbalizes understanding of appt date/time, parking situation and where to check in, pre-test NPO status and medications ordered, and verified current allergies; name and call back number provided for further questions should they arise  Larey Brick RN Navigator Cardiac Imaging [facility] [phone] office [phone] cell  Patient to take 50mg  metoprolol tartrate two hours prior to his cardiac CT instructions.  He is aware to avoid ED medications and to arrive at 11am.

## 2021-12-03 ENCOUNTER — Other Ambulatory Visit: Payer: Self-pay | Admitting: Cardiology

## 2021-12-03 ENCOUNTER — Ambulatory Visit (HOSPITAL_COMMUNITY)
Admission: RE | Admit: 2021-12-03 | Discharge: 2021-12-03 | Disposition: A | Discharge status: Home / Self Care | Payer: BC Managed Care – PPO | Source: Ambulatory Visit | Attending: Cardiology | Admitting: Cardiology

## 2021-12-03 ENCOUNTER — Ambulatory Visit (HOSPITAL_BASED_OUTPATIENT_CLINIC_OR_DEPARTMENT_OTHER)
Admission: RE | Admit: 2021-12-03 | Discharge: 2021-12-03 | Disposition: A | Discharge status: Home / Self Care | Payer: BC Managed Care – PPO | Source: Ambulatory Visit | Attending: Cardiology | Admitting: Cardiology

## 2021-12-03 DIAGNOSIS — I2510 Atherosclerotic heart disease of native coronary artery without angina pectoris: Secondary | ICD-10-CM | POA: Diagnosis not present

## 2021-12-03 DIAGNOSIS — R931 Abnormal findings on diagnostic imaging of heart and coronary circulation: Secondary | ICD-10-CM

## 2021-12-03 DIAGNOSIS — R072 Precordial pain: Secondary | ICD-10-CM | POA: Insufficient documentation

## 2021-12-03 MED ORDER — IOHEXOL 350 MG/ML SOLN
100.0000 mL | Freq: Once | INTRAVENOUS | Status: AC | PRN
  Administered 2021-12-03: 100 mL via INTRAVENOUS

## 2021-12-03 MED ORDER — NITROGLYCERIN 0.4 MG SL SUBL
0.8000 mg | SUBLINGUAL_TABLET | Freq: Once | SUBLINGUAL | Status: AC
  Administered 2021-12-03: 0.8 mg via SUBLINGUAL

## 2021-12-03 MED ORDER — NITROGLYCERIN 0.4 MG SL SUBL
SUBLINGUAL_TABLET | SUBLINGUAL | Status: AC
  Filled 2021-12-03: qty 2

## 2021-12-05 ENCOUNTER — Encounter (HOSPITAL_BASED_OUTPATIENT_CLINIC_OR_DEPARTMENT_OTHER): Payer: Self-pay

## 2021-12-05 ENCOUNTER — Telehealth: Payer: Self-pay | Admitting: Cardiology

## 2021-12-05 NOTE — Telephone Encounter (Signed)
Pt would like a callback regarding CT results. Please advise

## 2021-12-05 NOTE — Telephone Encounter (Signed)
Pt updated with test result but seemed a little upset due to have to wait to discuss results in detailed. Nurse offered to schedule a sooner appointment with NP but pt stated he will wait until 9/7.

## 2021-12-05 NOTE — Telephone Encounter (Signed)
Hey I saw that Dr. Cristal Deer resulted this, have you released them yet? I just wanted to be sure what all he knew before I called him, please advise

## 2021-12-06 NOTE — Telephone Encounter (Signed)
Please advise 

## 2021-12-10 ENCOUNTER — Encounter (HOSPITAL_BASED_OUTPATIENT_CLINIC_OR_DEPARTMENT_OTHER): Payer: Self-pay | Admitting: Cardiology

## 2021-12-10 ENCOUNTER — Ambulatory Visit (INDEPENDENT_AMBULATORY_CARE_PROVIDER_SITE_OTHER): Payer: BC Managed Care – PPO | Admitting: Cardiology

## 2021-12-10 VITALS — BP 136/80 | HR 64 | Ht 70.0 in | Wt 253.5 lb

## 2021-12-10 DIAGNOSIS — R0609 Other forms of dyspnea: Secondary | ICD-10-CM

## 2021-12-10 DIAGNOSIS — Z8249 Family history of ischemic heart disease and other diseases of the circulatory system: Secondary | ICD-10-CM

## 2021-12-10 DIAGNOSIS — E7800 Pure hypercholesterolemia, unspecified: Secondary | ICD-10-CM

## 2021-12-10 DIAGNOSIS — I25118 Atherosclerotic heart disease of native coronary artery with other forms of angina pectoris: Secondary | ICD-10-CM

## 2021-12-10 DIAGNOSIS — M791 Myalgia, unspecified site: Secondary | ICD-10-CM | POA: Diagnosis not present

## 2021-12-10 DIAGNOSIS — R931 Abnormal findings on diagnostic imaging of heart and coronary circulation: Secondary | ICD-10-CM

## 2021-12-10 DIAGNOSIS — T466X5A Adverse effect of antihyperlipidemic and antiarteriosclerotic drugs, initial encounter: Secondary | ICD-10-CM

## 2021-12-10 DIAGNOSIS — Z01812 Encounter for preprocedural laboratory examination: Secondary | ICD-10-CM | POA: Diagnosis not present

## 2021-12-10 MED ORDER — METOPROLOL SUCCINATE ER 25 MG PO TB24: 25.0000 mg | ORAL_TABLET | Freq: Every day | ORAL | 3 refills | Status: DC

## 2021-12-10 MED ORDER — ATORVASTATIN CALCIUM 10 MG PO TABS: 10.0000 mg | ORAL_TABLET | Freq: Every day | ORAL | 11 refills | Status: DC

## 2021-12-10 MED ORDER — ASPIRIN 81 MG PO TBEC: 81.0000 mg | DELAYED_RELEASE_TABLET | Freq: Every day | ORAL | 3 refills | Status: DC

## 2021-12-10 MED ORDER — NITROGLYCERIN 0.4 MG SL SUBL: 0.4000 mg | SUBLINGUAL_TABLET | SUBLINGUAL | 3 refills | Status: DC | PRN

## 2021-12-10 NOTE — Patient Instructions (Signed)
Medication Instructions:  DECREASE: Aspirin to 81 mg daily START: Metoprolol Succinate 25 mg daily START: Atorvastatin 10 mg daily  For as needed Nitroglycerin, if you develop chest pain: Sit and rest 5 minutes. If chest pain does not resolve place 1 nitroglycerin under your tongue and wait 5 minutes. If chest pain does not resolve, place a 2nd nitroglycerin under your tongue and wait 5 more minutes. If chest pain does not resolve, place a 3rd nitroglycerin under your tongue and seek emergency services.    DO NOT TAKE Nitroglycerin within 48 hours of taking Tadalafil (Cialis)  *If you need a refill on your cardiac medications before your next appointment, please call your pharmacy*   Lab Work: Your provider has recommended lab work today (CBC, BMP, Lipid). Please have this collected at [facility] [place]. The lab is open 8:00 am - 4:30 pm. Please avoid 12:00p - 1:00p for lunch hour. You do not need an appointment. Please go to [address] [place], [place] [address]. This is in the Primary Care office on the 3rd floor, let them know you are there for blood work and they will direct you to the lab.  If you have labs (blood work) drawn today and your tests are completely normal, you will receive your results only by: MyChart Message (if you have MyChart) OR A paper copy in the mail If you have any lab test that is abnormal or we need to change your treatment, we will call you to review the results.   Testing/Procedures: Your physician has requested that you have a cardiac catheterization. Cardiac catheterization is used to diagnose and/or treat various heart conditions. Doctors may recommend this procedure for a number of different reasons. The most common reason is to evaluate chest pain. Chest pain can be a symptom of coronary artery disease (CAD), and cardiac catheterization can show whether plaque is narrowing or blocking your heart's arteries. This  procedure is also used to evaluate the valves, as well as measure the blood flow and oxygen levels in different parts of your heart. For further information please visit [email or web address] Please follow instruction sheet, as given. [facility]   Follow-Up: At [facility], you and your health needs are our priority.  As part of our continuing mission to provide you with exceptional heart care, we have created designated Provider Care Teams.  These Care Teams include your primary Cardiologist (physician) and Advanced Practice Providers (APPs -  Physician Assistants and Nurse Practitioners) who all work together to provide you with the care you need, when you need it.  We recommend signing up for the patient portal called "MyChart".  Sign up information is provided on this After Visit Summary.  MyChart is used to connect with patients for Virtual Visits (Telemedicine).  Patients are able to view lab/test results, encounter notes, upcoming appointments, etc.  Non-urgent messages can be sent to your provider as well.   To learn more about what you can do with MyChart, go to [email or web address].    Your next appointment:   3 -4 week(s)  The format for your next appointment:   In Person  Provider:   Jodelle Red, [facility]   [facility] CARDIOLOGY [address] [place] [address] Dept: [phone]  Jonathan Riley  12/10/2021  You are scheduled for a Cardiac Catheterization on Friday, September 1 with Dr. Verdis Prime.  1. Please arrive at the Main Entrance A at [facility]  Hospital: [address] [place], [place] [address] at 5:30 AM (This time is two hours before your procedure to ensure your preparation). Free valet parking service is available.   Special note: Every effort is made to have your procedure done on time. Please understand that emergencies sometimes delay scheduled procedures.  2.  Diet: Do not eat solid foods after midnight.  You may have clear liquids until 5 AM upon the day of the procedure.  3. Labs: You will need to have blood drawn on today (CBC, BMP)  4. Medication instructions in preparation for your procedure:   Contrast Allergy: No    Do not take Diabetes Med Glucophage (Metformin) on the day of the procedure and HOLD 48 HOURS AFTER THE PROCEDURE.  On the morning of your procedure, take Aspirin and any morning medicines NOT listed above.  You may use sips of water.  5. Plan to go home the same day, you will only stay overnight if medically necessary. 6. You MUST have a responsible adult to drive you home. 7. An adult MUST be with you the first 24 hours after you arrive home. 8. Bring a current list of your medications, and the last time and date medication taken. 9. Bring ID and current insurance cards. 10.Please wear clothes that are easy to get on and off and wear slip-on shoes.  Thank you for allowing [place] to care for you!   -- [place] [facility]

## 2021-12-10 NOTE — H&P (View-Only) (Signed)
Cardiology Office Note:    Date:  12/10/2021   ID:  Jonathan Riley, DOB 1960/10/23, MRN [id number]  PCP:  Blair Heys, MD  Cardiologist:  Jodelle Red, MD  Referring MD: Blair Heys, MD   CC: Follow-up for dyspnea on exertion  History of Present Illness:    Jonathan Riley is a 61 y.o. male with a hx of hypertension, type II diabetes, hyperlipidemia who is seen for folow-up. He was initially seen 11/14/2021 as a new consult at the request of Blair Heys, MD for the evaluation and management of dyspnea on exertion.  Referral request from Dr. Manus Gunning dated 10/29/21 notes dyspnea on exertion, with history of hypertension, diabetes, and hyperlipidemia.  Cardiovascular risk factors: Prior clinical ASCVD: none Comorbid conditions: hypertension--about 7-8 years, hyperlipidemia--was on a statin a few years ago (doesn't recall which one), had joint aches which improved off statin, diabetes--few years. Denies chronic kidney disease  Metabolic syndrome/Obesity: BMI 35 Chronic inflammatory conditions: none Tobacco use history: former smoker, smoked about 23 years, quit, then started back for a few years, then quit again. Family history: father had MI age 83. Maternal grandfather had several MI. Paternal uncle died of MI as well. Maternal grandmother had a stroke. Maternal aunt had a stroke.Brother was a Arts development officer, has Parkinson's, HTN, DM. Prior pertinent testing and/or incidental findings: coronary calcium/aortic atherosclerosis seen on CTPE 04/2017. Exercise level: works for [employer], climbs poles. Had knee surgery in December, was out of work for 4 mos, thought the shortness of breath would get better getting back to activity but it has not. Doesn't recall having this before knee surgery.  At his last appointment, he reported shortness of breath and a pain in his throat while being active. A coronary CT was ordered, which showed a coronary calcium score of 1633 on 12/03/2021. 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  Today: He is accompanied by his daughter. At the moment he feels alright. However, when he exerts himself he becomes short of breath.   The other day he had noticed neck and shoulder pain.  We reviewed the results of his coronary CT at length.  He confirms taking a low dose of rosuvastatin previously. This was discontinued due to causing myalgias.  Usually he stays active at work. He is frequently climbing and carrying ladders. At one time, by the time he was finished with work he had pain in his throat associated with tremors. He had been fearful that he was having a heart attack.  He denies any palpitations, chest pain, or peripheral edema. No lightheadedness, headaches, syncope, orthopnea, or PND.   Past Medical History:  Diagnosis Date   Arthritis    Depression    Diabetes mellitus without complication (HCC)    Dyspnea    "with exertion"   Hypertension    Nocturia    1-2 times during night   PONV (postoperative nausea and vomiting)    "nausea after a knee surgery"    Past Surgical History:  Procedure Laterality Date   COLONOSCOPY     KNEE SURGERY Bilateral    Right, Left x2   TOTAL HIP ARTHROPLASTY Right 03/03/2016   Procedure: TOTAL HIP ARTHROPLASTY ANTERIOR APPROACH;  Surgeon: Samson Frederic, MD;  Location: [facility] OR;  Service: Orthopedics;  Laterality: Right;   WRIST SURGERY Right 2008 or 2009    Current Medications: Current Outpatient Medications on File Prior to Visit  Medication Sig   amLODipine (NORVASC) 5 MG tablet Take 5 mg by mouth daily.   CIALIS 20 MG tablet Take 20 mg by mouth daily as needed for erectile dysfunction.    metFORMIN (GLUCOPHAGE-XR) 500 MG 24 hr tablet Take 500 mg by mouth daily.   telmisartan (MICARDIS) 80 MG tablet Take 80 mg by mouth daily.   No current facility-administered medications on file prior to visit.      Allergies:   Percocet [oxycodone-acetaminophen] and Rosuvastatin   Social History   Tobacco Use   Smoking status: Former    Types: Cigarettes   Smokeless tobacco: Never   Tobacco comments:    "quit about 12 years ago" 02/26/2016  Substance Use Topics   Alcohol use: No   Drug use: No    Family History: family history includes Cancer in his unknown relative; Diabetes in his unknown relative; Heart disease in his unknown relative.  ROS:   Please see the history of present illness. (+) Exertional shortness of breath (+) Neck and shoulder pain All other systems are reviewed and negative.    EKGs/Labs/Other Studies Reviewed:    The following studies were reviewed today:  Coronary CTA  12/03/2021: IMPRESSION: 1. Coronary calcium score of 1633. This was 26 percentile for age-, sex, and race-matched controls.   2. Normal coronary origin with right dominance.   3. Moderate (50-69) stenoses in the LAD, Lcx, OM2 and RCA; the distal Lcx appears to be occluded.   4. Dilated ascending aorta; aortic atherosclerosis.   5. Study will be sent for FFR.   RECOMMENDATIONS: CAD-RADS 5: Total coronary occlusion (100%). Consider cardiac catheterization or viability assessment. Consider symptom-guided anti-ischemic pharmacotherapy as well as risk factor modification per guideline directed care.  FFRCT Analysis  12/03/2021: FINDINGS: FFRct analysis was performed on the original cardiac CT angiogram dataset. Diagrammatic representation of the FFRct analysis is provided in a separate PDF document in PACS. This dictation was created using the PDF document and an interactive 3D model of the results. 3D model is not available in the EMR/PACS. Normal FFR range is >0.80.   1. Left Main:   2. LAD: findings 0.81, 0.72, 0.69 very distal vessel D1 0.86 D2 0.79   3. LCX: findings 0.80, 0.58, CTO   4. Ramus: findings 0.90   5. RCA: findings 0.91, 0.76   IMPRESSION: FFR suggests distal  LAD and distal RCA flow limiting but in very distal vessel; mid Lcx lesion flow limiting followed by CTO.  Echo  11/27/2021:  1. Left ventricular ejection fraction, by estimation, is 60 to 65%. The  left ventricle has normal function. The left ventricle has no regional  wall motion abnormalities. There is moderate left ventricular hypertrophy.  Left ventricular diastolic  parameters are indeterminate.   2. Right ventricular systolic function is normal. The right ventricular  size is normal. Tricuspid regurgitation signal is inadequate for assessing  PA pressure.   3. The mitral valve is normal in structure. Trivial mitral valve  regurgitation. No evidence of mitral stenosis.   4. The aortic valve was not well visualized. Aortic valve regurgitation  is trivial. No aortic stenosis is present.   5. Aortic dilatation noted. There is dilatation of the ascending aorta,  measuring 43 mm.   CTA Chest  05/06/2017: FINDINGS: Cardiovascular: There is mild cardiomegaly. Multi vessel coronary vascular calcification. There is no pericardial effusion. Mild atherosclerotic calcification of the thoracic aorta. Mild the origins of the great vessels of the aortic arch are patent. Evaluation  of the pulmonary arteries is limited due to respiratory motion artifact and suboptimal enhancement of the peripheral branches. No large or central pulmonary artery embolus identified.   Mediastinum/Nodes: No hilar or mediastinal adenopathy. The esophagus is grossly unremarkable. No mediastinal fluid collection.   Lungs/Pleura: Right lung base linear atelectatic changes noted. There is no focal consolidation, pleural effusion, or pneumothorax. The central airways are patent.   Upper Abdomen: Diffuse fatty infiltration of the liver. The visualized upper abdomen is otherwise unremarkable.   Musculoskeletal: No chest wall abnormality. No acute or significant osseous findings.   Review of the MIP images confirms  the above findings.   IMPRESSION: 1. No acute intrathoracic pathology. No CT evidence of central pulmonary artery embolus. 2. Mild cardiomegaly and coronary vascular calcification. 3. Right lung base linear atelectasis. 4. Fatty liver. 5. Mild Aortic Atherosclerosis (ICD10-I70.0).  EKG:  EKG is personally reviewed.   12/10/2021:  NSR, PRWP at 64 bpm 11/14/21 Possible ectopic rhythm (unusual p axis), rate 68 bpm, PRWP  Recent Labs: 04/14/2021: ALT 29; Hemoglobin 13.7; Platelets 221 11/27/2021: BUN 18; Creatinine, Ser 1.01; Potassium 4.4; Sodium 137   Recent Lipid Panel No results found for: "CHOL", "TRIG", "HDL", "CHOLHDL", "VLDL", "LDLCALC", "LDLDIRECT"  Physical Exam:    VS:  BP 136/80 (BP Location: Left Arm, Patient Position: Sitting, Cuff Size: Large)   Pulse 64   Ht 5\' 10"  (1.778 m)   Wt 253 lb 8 oz (115 kg)   BMI 36.37 kg/m     Wt Readings from Last 3 Encounters:  12/10/21 253 lb 8 oz (115 kg)  11/14/21 247 lb 12.8 oz (112.4 kg)  04/13/21 240 lb (108.9 kg)    GEN: Well nourished, well developed in no acute distress HEENT: Normal, moist mucous membranes NECK: No JVD CARDIAC: regular rhythm, normal S1 and S2, no rubs or gallops. No murmur. VASCULAR: Radial and DP pulses 2+ bilaterally. No carotid bruits RESPIRATORY:  Clear to auscultation without rales, wheezing or rhonchi  ABDOMEN: Soft, non-tender, non-distended MUSCULOSKELETAL:  Ambulates independently SKIN: Warm and dry, no edema NEUROLOGIC:  Alert and oriented x 3. No focal neuro deficits noted. PSYCHIATRIC:  Normal affect    ASSESSMENT:    1. Abnormal findings diagnostic imaging of heart and coronary circulation   2. Dyspnea on exertion   3. Coronary artery disease of native artery of native heart with stable angina pectoris (HCC)   4. Myalgia due to statin   5. Family history of heart disease   6. Pure hypercholesterolemia   7. Pre-procedure lab exam    PLAN:    Dyspnea on exertion, Throat pain on  exertion-->angina Coronary CT consistent with CTO of Lcx, obstructive CAD -we reviewed his test results at length today. -we discussed medical management. Starting atorvastatin, metoprolol, and SL NG PRN today. Changing aspirin to 81 mg dose. Discussed that if he does get stent, will need additional antiplatelet -reviewed red flag warning signs that need immediate medical attention -discussed interaction with SL NG and tadalafil. He states he is not currently taking and understands the interaction risk -we discussed trialing medical management first vs. Going to cath. After shared decision making, will proceed with cath. -echo reassuring, EF 60-65%  Shared Decision Making/Informed Consent The risks [stroke (1 in 1000), death (1 in 1000), kidney failure [usually temporary] (1 in 500), bleeding (1 in 200), allergic reaction [possibly serious] (1 in 200)], benefits (diagnostic support and management of coronary artery disease) and alternatives of a cardiac catheterization were discussed in detail  with Mr. Senner and he is willing to proceed.   Hypercholesterolemia Statin intolerance -reports prior joint aches on statin. Thinks it might have been rosuvastatin but not sure -If we cannot get him to tolerate statin, will need to try nexletol or PCSK9i. Given degree of CAD, I would use PCSK9i first if needed  Cardiac risk counseling and prevention recommendations: Family history of heart disease -recommend heart healthy/Mediterranean diet, with whole grains, fruits, vegetable, fish, lean meats, nuts, and olive oil. Limit salt. -recommend moderate walking, 3-5 times/week for 30-50 minutes each session. Aim for at least 150 minutes.week. Goal should be pace of 3 miles/hours, or walking 1.5 miles in 30 minutes -recommend avoidance of tobacco products. Avoid excess alcohol. -ASCVD risk score: The 10-year ASCVD risk score (Arnett DK, et al., 2019) is: 27.9%   Values used to calculate the score:     Age:  36 years     Sex: Male     Is Non-Hispanic African American: No     Diabetic: Yes     Tobacco smoker: No     Systolic Blood Pressure: 136 mmHg     Is BP treated: Yes     HDL Cholesterol: 30 mg/dL     Total Cholesterol: 196 mg/dL    Plan for follow up: 3-4 weeks or sooner as needed.  Jodelle Red, MD, PhD, [facility]  [facility]    Medication Adjustments/Labs and Tests Ordered: Current medicines are reviewed at length with the patient today.  Concerns regarding medicines are outlined above.   Orders Placed This Encounter  Procedures   Lipid panel   CBC   Basic metabolic panel   EKG 12-Lead   Meds ordered this encounter  Medications   aspirin EC 81 MG tablet    Sig: Take 1 tablet (81 mg total) by mouth daily. Swallow whole.    Dispense:  90 tablet    Refill:  3   atorvastatin (LIPITOR) 10 MG tablet    Sig: Take 1 tablet (10 mg total) by mouth daily.    Dispense:  30 tablet    Refill:  11   metoprolol succinate (TOPROL XL) 25 MG 24 hr tablet    Sig: Take 1 tablet (25 mg total) by mouth daily.    Dispense:  90 tablet    Refill:  3   nitroGLYCERIN (NITROSTAT) 0.4 MG SL tablet    Sig: Place 1 tablet (0.4 mg total) under the tongue every 5 (five) minutes as needed for chest pain.    Dispense:  25 tablet    Refill:  3   Patient Instructions  Medication Instructions:  DECREASE: Aspirin to 81 mg daily START: Metoprolol Succinate 25 mg daily START: Atorvastatin 10 mg daily  For as needed Nitroglycerin, if you develop chest pain: Sit and rest 5 minutes. If chest pain does not resolve place 1 nitroglycerin under your tongue and wait 5 minutes. If chest pain does not resolve, place a 2nd nitroglycerin under your tongue and wait 5 more minutes. If chest pain does not resolve, place a 3rd nitroglycerin under your tongue and seek emergency services.    DO NOT TAKE Nitroglycerin within 48 hours of taking Tadalafil (Cialis)  *If you need a refill on your  cardiac medications before your next appointment, please call your pharmacy*   Lab Work: Your provider has recommended lab work today (CBC, BMP, Lipid). Please have this collected at [facility] at [place]. The lab is open 8:00 am - 4:30  pm. Please avoid 12:00p - 1:00p for lunch hour. You do not need an appointment. Please go to [address] [place], [place] [address]. This is in the Primary Care office on the 3rd floor, let them know you are there for blood work and they will direct you to the lab.  If you have labs (blood work) drawn today and your tests are completely normal, you will receive your results only by: MyChart Message (if you have MyChart) OR A paper copy in the mail If you have any lab test that is abnormal or we need to change your treatment, we will call you to review the results.   Testing/Procedures: Your physician has requested that you have a cardiac catheterization. Cardiac catheterization is used to diagnose and/or treat various heart conditions. Doctors may recommend this procedure for a number of different reasons. The most common reason is to evaluate chest pain. Chest pain can be a symptom of coronary artery disease (CAD), and cardiac catheterization can show whether plaque is narrowing or blocking your heart's arteries. This procedure is also used to evaluate the valves, as well as measure the blood flow and oxygen levels in different parts of your heart. For further information please visit www.[email or web address].org. Please follow instruction sheet, as given. [facility]   Follow-Up: At [facility], you and your health needs are our priority.  As part of our continuing mission to provide you with exceptional heart care, we have created designated Provider Care Teams.  These Care Teams include your primary Cardiologist (physician) and Advanced Practice Providers (APPs -  Physician Assistants and Nurse Practitioners) who all work together  to provide you with the care you need, when you need it.  We recommend signing up for the patient portal called "MyChart".  Sign up information is provided on this After Visit Summary.  MyChart is used to connect with patients for Virtual Visits (Telemedicine).  Patients are able to view lab/test results, encounter notes, upcoming appointments, etc.  Non-urgent messages can be sent to your provider as well.   To learn more about what you can do with MyChart, go to [email or web address].    Your next appointment:   3 -4 week(s)  The format for your next appointment:   In Person  Provider:   Herald Vallin, MD{   [facility] [address] [place] [address] Dept: [phone]  Daymian A Much  12/10/2021  You are scheduled for a Cardiac Catheterization on Friday, September 1 with Dr. Henry Smith.  1. Please arrive at the Main Entrance A at [facility]: [address] [place], [place] [address] at 5:30 AM (This time is two hours before your procedure to ensure your preparation). Free valet parking service is available.   Special note: Every effort is made to have your procedure done on time. Please understand that emergencies sometimes delay scheduled procedures.  2. Diet: Do not eat solid foods after midnight.  You may have clear liquids until 5 AM upon the day of the procedure.  3. Labs: You will need to have blood drawn on today (CBC, BMP)  4. Medication instructions in preparation for your procedure:   Contrast Allergy: No    Do not take Diabetes Med Glucophage (Metformin) on the day of the procedure and HOLD 48 HOURS AFTER THE PROCEDURE.  On the morning of your procedure, take Aspirin and any morning medicines NOT listed above.  You may use sips of   water.  5. Plan to go home the same day, you will only stay overnight if medically necessary. 6. You MUST have a responsible  adult to drive you home. 7. An adult MUST be with you the first 24 hours after you arrive home. 8. Bring a current list of your medications, and the last time and date medication taken. 9. Bring ID and current insurance cards. 10.Please wear clothes that are easy to get on and off and wear slip-on shoes.  Thank you for allowing [place] to care for you!   -- [facility]        I,Mathew Stumpf,acting as a scribe for Jodelle Red, MD.,have documented all relevant documentation on the behalf of Jodelle Red, MD,as directed by  Jodelle Red, MD while in the presence of Jodelle Red, MD.  I, Jodelle Red, MD, have reviewed all documentation for this visit. The documentation on 12/10/21 for the exam, diagnosis, procedures, and orders are all accurate and complete.   Total time of encounter: 40 minutes total time of encounter, including 39 minutes spent in face-to-face patient care. This time includes coordination of care and counseling regarding above conditions. Remainder of non-face-to-face time involved reviewing chart documents/testing relevant to the patient encounter and documentation in the medical record.  Jodelle Red, MD, PhD, [facility]  [facility]     Signed, Jodelle Red, MD PhD 12/10/2021 2:31 PM    [facility]

## 2021-12-10 NOTE — Progress Notes (Signed)
Cardiology Office Note:    Date:  12/10/2021   ID:  Jonathan Riley, DOB 1960/10/23, MRN [id number]  PCP:  Blair Heys, MD  Cardiologist:  Jodelle Red, MD  Referring MD: Blair Heys, MD   CC: Follow-up for dyspnea on exertion  History of Present Illness:    Jonathan Riley is a 61 y.o. male with a hx of hypertension, type II diabetes, hyperlipidemia who is seen for folow-up. He was initially seen 11/14/2021 as a new consult at the request of Blair Heys, MD for the evaluation and management of dyspnea on exertion.  Referral request from Dr. Manus Gunning dated 10/29/21 notes dyspnea on exertion, with history of hypertension, diabetes, and hyperlipidemia.  Cardiovascular risk factors: Prior clinical ASCVD: none Comorbid conditions: hypertension--about 7-8 years, hyperlipidemia--was on a statin a few years ago (doesn't recall which one), had joint aches which improved off statin, diabetes--few years. Denies chronic kidney disease  Metabolic syndrome/Obesity: BMI 35 Chronic inflammatory conditions: none Tobacco use history: former smoker, smoked about 23 years, quit, then started back for a few years, then quit again. Family history: father had MI age 83. Maternal grandfather had several MI. Paternal uncle died of MI as well. Maternal grandmother had a stroke. Maternal aunt had a stroke.Brother was a Arts development officer, has Parkinson's, HTN, DM. Prior pertinent testing and/or incidental findings: coronary calcium/aortic atherosclerosis seen on CTPE 04/2017. Exercise level: works for [employer], climbs poles. Had knee surgery in December, was out of work for 4 mos, thought the shortness of breath would get better getting back to activity but it has not. Doesn't recall having this before knee surgery.  At his last appointment, he reported shortness of breath and a pain in his throat while being active. A coronary CT was ordered, which showed a coronary calcium score of 1633 on 12/03/2021. 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  Today: He is accompanied by his daughter. At the moment he feels alright. However, when he exerts himself he becomes short of breath.   The other day he had noticed neck and shoulder pain.  We reviewed the results of his coronary CT at length.  He confirms taking a low dose of rosuvastatin previously. This was discontinued due to causing myalgias.  Usually he stays active at work. He is frequently climbing and carrying ladders. At one time, by the time he was finished with work he had pain in his throat associated with tremors. He had been fearful that he was having a heart attack.  He denies any palpitations, chest pain, or peripheral edema. No lightheadedness, headaches, syncope, orthopnea, or PND.   Past Medical History:  Diagnosis Date   Arthritis    Depression    Diabetes mellitus without complication (HCC)    Dyspnea    "with exertion"   Hypertension    Nocturia    1-2 times during night   PONV (postoperative nausea and vomiting)    "nausea after a knee surgery"    Past Surgical History:  Procedure Laterality Date   COLONOSCOPY     KNEE SURGERY Bilateral    Right, Left x2   TOTAL HIP ARTHROPLASTY Right 03/03/2016   Procedure: TOTAL HIP ARTHROPLASTY ANTERIOR APPROACH;  Surgeon: Samson Frederic, MD;  Location: [facility] OR;  Service: Orthopedics;  Laterality: Right;   WRIST SURGERY Right 2008 or 2009    Current Medications: Current Outpatient Medications on File Prior to Visit  Medication Sig   amLODipine (NORVASC) 5 MG tablet Take 5 mg by mouth daily.   CIALIS 20 MG tablet Take 20 mg by mouth daily as needed for erectile dysfunction.    metFORMIN (GLUCOPHAGE-XR) 500 MG 24 hr tablet Take 500 mg by mouth daily.   telmisartan (MICARDIS) 80 MG tablet Take 80 mg by mouth daily.   No current facility-administered medications on file prior to visit.      Allergies:   Percocet [oxycodone-acetaminophen] and Rosuvastatin   Social History   Tobacco Use   Smoking status: Former    Types: Cigarettes   Smokeless tobacco: Never   Tobacco comments:    "quit about 12 years ago" 02/26/2016  Substance Use Topics   Alcohol use: No   Drug use: No    Family History: family history includes Cancer in his unknown relative; Diabetes in his unknown relative; Heart disease in his unknown relative.  ROS:   Please see the history of present illness. (+) Exertional shortness of breath (+) Neck and shoulder pain All other systems are reviewed and negative.    EKGs/Labs/Other Studies Reviewed:    The following studies were reviewed today:  Coronary CTA  12/03/2021: IMPRESSION: 1. Coronary calcium score of 1633. This was 26 percentile for age-, sex, and race-matched controls.   2. Normal coronary origin with right dominance.   3. Moderate (50-69) stenoses in the LAD, Lcx, OM2 and RCA; the distal Lcx appears to be occluded.   4. Dilated ascending aorta; aortic atherosclerosis.   5. Study will be sent for FFR.   RECOMMENDATIONS: CAD-RADS 5: Total coronary occlusion (100%). Consider cardiac catheterization or viability assessment. Consider symptom-guided anti-ischemic pharmacotherapy as well as risk factor modification per guideline directed care.  FFRCT Analysis  12/03/2021: FINDINGS: FFRct analysis was performed on the original cardiac CT angiogram dataset. Diagrammatic representation of the FFRct analysis is provided in a separate PDF document in PACS. This dictation was created using the PDF document and an interactive 3D model of the results. 3D model is not available in the EMR/PACS. Normal FFR range is >0.80.   1. Left Main:   2. LAD: findings 0.81, 0.72, 0.69 very distal vessel D1 0.86 D2 0.79   3. LCX: findings 0.80, 0.58, CTO   4. Ramus: findings 0.90   5. RCA: findings 0.91, 0.76   IMPRESSION: FFR suggests distal  LAD and distal RCA flow limiting but in very distal vessel; mid Lcx lesion flow limiting followed by CTO.  Echo  11/27/2021:  1. Left ventricular ejection fraction, by estimation, is 60 to 65%. The  left ventricle has normal function. The left ventricle has no regional  wall motion abnormalities. There is moderate left ventricular hypertrophy.  Left ventricular diastolic  parameters are indeterminate.   2. Right ventricular systolic function is normal. The right ventricular  size is normal. Tricuspid regurgitation signal is inadequate for assessing  PA pressure.   3. The mitral valve is normal in structure. Trivial mitral valve  regurgitation. No evidence of mitral stenosis.   4. The aortic valve was not well visualized. Aortic valve regurgitation  is trivial. No aortic stenosis is present.   5. Aortic dilatation noted. There is dilatation of the ascending aorta,  measuring 43 mm.   CTA Chest  05/06/2017: FINDINGS: Cardiovascular: There is mild cardiomegaly. Multi vessel coronary vascular calcification. There is no pericardial effusion. Mild atherosclerotic calcification of the thoracic aorta. Mild the origins of the great vessels of the aortic arch are patent. Evaluation  of the pulmonary arteries is limited due to respiratory motion artifact and suboptimal enhancement of the peripheral branches. No large or central pulmonary artery embolus identified.   Mediastinum/Nodes: No hilar or mediastinal adenopathy. The esophagus is grossly unremarkable. No mediastinal fluid collection.   Lungs/Pleura: Right lung base linear atelectatic changes noted. There is no focal consolidation, pleural effusion, or pneumothorax. The central airways are patent.   Upper Abdomen: Diffuse fatty infiltration of the liver. The visualized upper abdomen is otherwise unremarkable.   Musculoskeletal: No chest wall abnormality. No acute or significant osseous findings.   Review of the MIP images confirms  the above findings.   IMPRESSION: 1. No acute intrathoracic pathology. No CT evidence of central pulmonary artery embolus. 2. Mild cardiomegaly and coronary vascular calcification. 3. Right lung base linear atelectasis. 4. Fatty liver. 5. Mild Aortic Atherosclerosis (ICD10-I70.0).  EKG:  EKG is personally reviewed.   12/10/2021:  NSR, PRWP at 64 bpm 11/14/21 Possible ectopic rhythm (unusual p axis), rate 68 bpm, PRWP  Recent Labs: 04/14/2021: ALT 29; Hemoglobin 13.7; Platelets 221 11/27/2021: BUN 18; Creatinine, Ser 1.01; Potassium 4.4; Sodium 137   Recent Lipid Panel No results found for: "CHOL", "TRIG", "HDL", "CHOLHDL", "VLDL", "LDLCALC", "LDLDIRECT"  Physical Exam:    VS:  BP 136/80 (BP Location: Left Arm, Patient Position: Sitting, Cuff Size: Large)   Pulse 64   Ht 5\' 10"  (1.778 m)   Wt 253 lb 8 oz (115 kg)   BMI 36.37 kg/m     Wt Readings from Last 3 Encounters:  12/10/21 253 lb 8 oz (115 kg)  11/14/21 247 lb 12.8 oz (112.4 kg)  04/13/21 240 lb (108.9 kg)    GEN: Well nourished, well developed in no acute distress HEENT: Normal, moist mucous membranes NECK: No JVD CARDIAC: regular rhythm, normal S1 and S2, no rubs or gallops. No murmur. VASCULAR: Radial and DP pulses 2+ bilaterally. No carotid bruits RESPIRATORY:  Clear to auscultation without rales, wheezing or rhonchi  ABDOMEN: Soft, non-tender, non-distended MUSCULOSKELETAL:  Ambulates independently SKIN: Warm and dry, no edema NEUROLOGIC:  Alert and oriented x 3. No focal neuro deficits noted. PSYCHIATRIC:  Normal affect    ASSESSMENT:    1. Abnormal findings diagnostic imaging of heart and coronary circulation   2. Dyspnea on exertion   3. Coronary artery disease of native artery of native heart with stable angina pectoris (HCC)   4. Myalgia due to statin   5. Family history of heart disease   6. Pure hypercholesterolemia   7. Pre-procedure lab exam    PLAN:    Dyspnea on exertion, Throat pain on  exertion-->angina Coronary CT consistent with CTO of Lcx, obstructive CAD -we reviewed his test results at length today. -we discussed medical management. Starting atorvastatin, metoprolol, and SL NG PRN today. Changing aspirin to 81 mg dose. Discussed that if he does get stent, will need additional antiplatelet -reviewed red flag warning signs that need immediate medical attention -discussed interaction with SL NG and tadalafil. He states he is not currently taking and understands the interaction risk -we discussed trialing medical management first vs. Going to cath. After shared decision making, will proceed with cath. -echo reassuring, EF 60-65%  Shared Decision Making/Informed Consent The risks [stroke (1 in 1000), death (1 in 1000), kidney failure [usually temporary] (1 in 500), bleeding (1 in 200), allergic reaction [possibly serious] (1 in 200)], benefits (diagnostic support and management of coronary artery disease) and alternatives of a cardiac catheterization were discussed in detail  with Jonathan Riley and he is willing to proceed.   Hypercholesterolemia Statin intolerance -reports prior joint aches on statin. Thinks it might have been rosuvastatin but not sure -If we cannot get him to tolerate statin, will need to try nexletol or PCSK9i. Given degree of CAD, I would use PCSK9i first if needed  Cardiac risk counseling and prevention recommendations: Family history of heart disease -recommend heart healthy/Mediterranean diet, with whole grains, fruits, vegetable, fish, lean meats, nuts, and olive oil. Limit salt. -recommend moderate walking, 3-5 times/week for 30-50 minutes each session. Aim for at least 150 minutes.week. Goal should be pace of 3 miles/hours, or walking 1.5 miles in 30 minutes -recommend avoidance of tobacco products. Avoid excess alcohol. -ASCVD risk score: The 10-year ASCVD risk score (Arnett DK, et al., 2019) is: 27.9%   Values used to calculate the score:     Age:  36 years     Sex: Male     Is Non-Hispanic African American: No     Diabetic: Yes     Tobacco smoker: No     Systolic Blood Pressure: 136 mmHg     Is BP treated: Yes     HDL Cholesterol: 30 mg/dL     Total Cholesterol: 196 mg/dL    Plan for follow up: 3-4 weeks or sooner as needed.  Jodelle Red, MD, PhD, [facility]  [facility]    Medication Adjustments/Labs and Tests Ordered: Current medicines are reviewed at length with the patient today.  Concerns regarding medicines are outlined above.   Orders Placed This Encounter  Procedures   Lipid panel   CBC   Basic metabolic panel   EKG 12-Lead   Meds ordered this encounter  Medications   aspirin EC 81 MG tablet    Sig: Take 1 tablet (81 mg total) by mouth daily. Swallow whole.    Dispense:  90 tablet    Refill:  3   atorvastatin (LIPITOR) 10 MG tablet    Sig: Take 1 tablet (10 mg total) by mouth daily.    Dispense:  30 tablet    Refill:  11   metoprolol succinate (TOPROL XL) 25 MG 24 hr tablet    Sig: Take 1 tablet (25 mg total) by mouth daily.    Dispense:  90 tablet    Refill:  3   nitroGLYCERIN (NITROSTAT) 0.4 MG SL tablet    Sig: Place 1 tablet (0.4 mg total) under the tongue every 5 (five) minutes as needed for chest pain.    Dispense:  25 tablet    Refill:  3   Patient Instructions  Medication Instructions:  DECREASE: Aspirin to 81 mg daily START: Metoprolol Succinate 25 mg daily START: Atorvastatin 10 mg daily  For as needed Nitroglycerin, if you develop chest pain: Sit and rest 5 minutes. If chest pain does not resolve place 1 nitroglycerin under your tongue and wait 5 minutes. If chest pain does not resolve, place a 2nd nitroglycerin under your tongue and wait 5 more minutes. If chest pain does not resolve, place a 3rd nitroglycerin under your tongue and seek emergency services.    DO NOT TAKE Nitroglycerin within 48 hours of taking Tadalafil (Cialis)  *If you need a refill on your  cardiac medications before your next appointment, please call your pharmacy*   Lab Work: Your provider has recommended lab work today (CBC, BMP, Lipid). Please have this collected at [facility] at [place]. The lab is open 8:00 am - 4:30  pm. Please avoid 12:00p - 1:00p for lunch hour. You do not need an appointment. Please go to [address] [place], [place] [address]. This is in the Primary Care office on the 3rd floor, let them know you are there for blood work and they will direct you to the lab.  If you have labs (blood work) drawn today and your tests are completely normal, you will receive your results only by: MyChart Message (if you have MyChart) OR A paper copy in the mail If you have any lab test that is abnormal or we need to change your treatment, we will call you to review the results.   Testing/Procedures: Your physician has requested that you have a cardiac catheterization. Cardiac catheterization is used to diagnose and/or treat various heart conditions. Doctors may recommend this procedure for a number of different reasons. The most common reason is to evaluate chest pain. Chest pain can be a symptom of coronary artery disease (CAD), and cardiac catheterization can show whether plaque is narrowing or blocking your heart's arteries. This procedure is also used to evaluate the valves, as well as measure the blood flow and oxygen levels in different parts of your heart. For further information please visit [email or web address] Please follow instruction sheet, as given. [facility]   Follow-Up: At [facility], you and your health needs are our priority.  As part of our continuing mission to provide you with exceptional heart care, we have created designated Provider Care Teams.  These Care Teams include your primary Cardiologist (physician) and Advanced Practice Providers (APPs -  Physician Assistants and Nurse Practitioners) who all work together  to provide you with the care you need, when you need it.  We recommend signing up for the patient portal called "MyChart".  Sign up information is provided on this After Visit Summary.  MyChart is used to connect with patients for Virtual Visits (Telemedicine).  Patients are able to view lab/test results, encounter notes, upcoming appointments, etc.  Non-urgent messages can be sent to your provider as well.   To learn more about what you can do with MyChart, go to [email or web address].    Your next appointment:   3 -4 week(s)  The format for your next appointment:   In Person  Provider:   Jodelle Red, [facility]   [place] [facility] MEDCENTER [facility] CARDIOLOGY [address] [place] [address] Dept: [phone]  Jonathan Riley  12/10/2021  You are scheduled for a Cardiac Catheterization on Friday, September 1 with Dr. Verdis Prime.  1. Please arrive at the Main Entrance A at [facility]: [address] [place], [place] [address] at 5:30 AM (This time is two hours before your procedure to ensure your preparation). Free valet parking service is available.   Special note: Every effort is made to have your procedure done on time. Please understand that emergencies sometimes delay scheduled procedures.  2. Diet: Do not eat solid foods after midnight.  You may have clear liquids until 5 AM upon the day of the procedure.  3. Labs: You will need to have blood drawn on today (CBC, BMP)  4. Medication instructions in preparation for your procedure:   Contrast Allergy: No    Do not take Diabetes Med Glucophage (Metformin) on the day of the procedure and HOLD 48 HOURS AFTER THE PROCEDURE.  On the morning of your procedure, take Aspirin and any morning medicines NOT listed above.  You may use sips of  water.  5. Plan to go home the same day, you will only stay overnight if medically necessary. 6. You MUST have a responsible  adult to drive you home. 7. An adult MUST be with you the first 24 hours after you arrive home. 8. Bring a current list of your medications, and the last time and date medication taken. 9. Bring ID and current insurance cards. 10.Please wear clothes that are easy to get on and off and wear slip-on shoes.  Thank you for allowing [place] to care for you!   -- [facility]        I,Mathew Stumpf,acting as a scribe for Jodelle Red, MD.,have documented all relevant documentation on the behalf of Jodelle Red, MD,as directed by  Jodelle Red, MD while in the presence of Jodelle Red, MD.  I, Jodelle Red, MD, have reviewed all documentation for this visit. The documentation on 12/10/21 for the exam, diagnosis, procedures, and orders are all accurate and complete.   Total time of encounter: 40 minutes total time of encounter, including 39 minutes spent in face-to-face patient care. This time includes coordination of care and counseling regarding above conditions. Remainder of non-face-to-face time involved reviewing chart documents/testing relevant to the patient encounter and documentation in the medical record.  Jodelle Red, MD, PhD, [facility]  [facility]     Signed, Jodelle Red, MD PhD 12/10/2021 2:31 PM    [facility]

## 2021-12-11 LAB — BASIC METABOLIC PANEL
BUN/Creatinine Ratio: 9 — ABNORMAL LOW (ref 10–24)
BUN: 8 mg/dL (ref 8–27)
CO2: 21 mmol/L (ref 20–29)
Calcium: 9.4 mg/dL (ref 8.6–10.2)
Chloride: 99 mmol/L (ref 96–106)
Creatinine, Ser: 0.93 mg/dL (ref 0.76–1.27)
Glucose: 214 mg/dL — ABNORMAL HIGH (ref 70–99)
Potassium: 4.6 mmol/L (ref 3.5–5.2)
Sodium: 136 mmol/L (ref 134–144)
eGFR: 94 mL/min/{1.73_m2} (ref >59)

## 2021-12-11 LAB — CBC
Hematocrit: 47.8 % (ref 37.5–51.0)
Hemoglobin: 15.7 g/dL (ref 13.0–17.7)
MCH: 26.7 pg (ref 26.6–33.0)
MCHC: 32.8 g/dL (ref 31.5–35.7)
MCV: 81 fL (ref 79–97)
Platelets: 192 10*3/uL (ref 150–450)
RBC: 5.89 x10E6/uL — ABNORMAL HIGH (ref 4.14–5.80)
RDW: 14.5 % (ref 11.6–15.4)
WBC: 5.9 10*3/uL (ref 3.4–10.8)

## 2021-12-11 LAB — LIPID PANEL
Chol/HDL Ratio: 6.5 ratio — ABNORMAL HIGH (ref 0.0–5.0)
Cholesterol, Total: 196 mg/dL (ref 100–199)
HDL: 30 mg/dL — ABNORMAL LOW (ref >39)
LDL Chol Calc (NIH): 122 mg/dL — ABNORMAL HIGH (ref 0–99)
Triglycerides: 246 mg/dL — ABNORMAL HIGH (ref 0–149)
VLDL Cholesterol Cal: 44 mg/dL — ABNORMAL HIGH (ref 5–40)

## 2021-12-18 ENCOUNTER — Encounter (HOSPITAL_BASED_OUTPATIENT_CLINIC_OR_DEPARTMENT_OTHER): Payer: Self-pay | Admitting: Cardiology

## 2021-12-19 ENCOUNTER — Telehealth: Payer: Self-pay | Admitting: *Deleted

## 2021-12-19 NOTE — H&P (Signed)
DOE Abnormal Cor CTA with 3 vessel disease

## 2021-12-19 NOTE — Telephone Encounter (Signed)
Cardiac Catheterization scheduled at [facility] for: Friday December 20, 2021 7:30 AM Arrival time and place: [facility] Main Entrance A at: 5:30 AM  Nothing to eat after midnight prior to procedure, clear liquids until 5 AM day of procedure.  Medication instructions: -Hold:  Metformin-day of procedure and 48 hours post procedure -Except hold medications usual morning medications can be taken with sips of water including aspirin 81 mg.  Confirmed patient has responsible adult to drive home post procedure and be with patient first 24 hours after arriving home.  Patient reports no new symptoms concerning for COVID-19 in the past 10 days.  Reviewed procedure instructions with patient.

## 2021-12-20 ENCOUNTER — Encounter (HOSPITAL_COMMUNITY)
Admission: RE | Disposition: A | Discharge status: Home / Self Care | Payer: Self-pay | Source: Home / Self Care | Attending: Thoracic Surgery (Cardiothoracic Vascular Surgery)

## 2021-12-20 ENCOUNTER — Inpatient Hospital Stay (HOSPITAL_COMMUNITY)
Admission: RE | Admit: 2021-12-20 | Discharge: 2021-12-30 | DRG: 233 | Disposition: A | Discharge status: Home / Self Care | Payer: BC Managed Care – PPO | Attending: Thoracic Surgery (Cardiothoracic Vascular Surgery) | Admitting: Thoracic Surgery (Cardiothoracic Vascular Surgery)

## 2021-12-20 ENCOUNTER — Other Ambulatory Visit: Payer: Self-pay

## 2021-12-20 ENCOUNTER — Encounter (HOSPITAL_COMMUNITY): Payer: Self-pay | Admitting: Interventional Cardiology

## 2021-12-20 DIAGNOSIS — R0902 Hypoxemia: Secondary | ICD-10-CM | POA: Diagnosis not present

## 2021-12-20 DIAGNOSIS — Z886 Allergy status to analgesic agent status: Secondary | ICD-10-CM

## 2021-12-20 DIAGNOSIS — Z79899 Other long term (current) drug therapy: Secondary | ICD-10-CM

## 2021-12-20 DIAGNOSIS — J9811 Atelectasis: Secondary | ICD-10-CM | POA: Diagnosis present

## 2021-12-20 DIAGNOSIS — Z9911 Dependence on respirator [ventilator] status: Secondary | ICD-10-CM | POA: Diagnosis not present

## 2021-12-20 DIAGNOSIS — F32A Depression, unspecified: Secondary | ICD-10-CM | POA: Diagnosis not present

## 2021-12-20 DIAGNOSIS — E669 Obesity, unspecified: Secondary | ICD-10-CM | POA: Diagnosis not present

## 2021-12-20 DIAGNOSIS — Z951 Presence of aortocoronary bypass graft: Secondary | ICD-10-CM | POA: Diagnosis not present

## 2021-12-20 DIAGNOSIS — E119 Type 2 diabetes mellitus without complications: Secondary | ICD-10-CM | POA: Diagnosis not present

## 2021-12-20 DIAGNOSIS — I517 Cardiomegaly: Secondary | ICD-10-CM | POA: Diagnosis not present

## 2021-12-20 DIAGNOSIS — I4891 Unspecified atrial fibrillation: Secondary | ICD-10-CM | POA: Diagnosis not present

## 2021-12-20 DIAGNOSIS — E785 Hyperlipidemia, unspecified: Secondary | ICD-10-CM | POA: Diagnosis not present

## 2021-12-20 DIAGNOSIS — R079 Chest pain, unspecified: Secondary | ICD-10-CM | POA: Diagnosis not present

## 2021-12-20 DIAGNOSIS — Z96641 Presence of right artificial hip joint: Secondary | ICD-10-CM | POA: Diagnosis present

## 2021-12-20 DIAGNOSIS — I200 Unstable angina: Secondary | ICD-10-CM | POA: Diagnosis present

## 2021-12-20 DIAGNOSIS — Y838 Other surgical procedures as the cause of abnormal reaction of the patient, or of later complication, without mention of misadventure at the time of the procedure: Secondary | ICD-10-CM | POA: Diagnosis not present

## 2021-12-20 DIAGNOSIS — Z1152 Encounter for screening for COVID-19: Secondary | ICD-10-CM | POA: Diagnosis not present

## 2021-12-20 DIAGNOSIS — J9601 Acute respiratory failure with hypoxia: Secondary | ICD-10-CM | POA: Diagnosis not present

## 2021-12-20 DIAGNOSIS — Z87891 Personal history of nicotine dependence: Secondary | ICD-10-CM

## 2021-12-20 DIAGNOSIS — I77810 Thoracic aortic ectasia: Secondary | ICD-10-CM | POA: Diagnosis not present

## 2021-12-20 DIAGNOSIS — Z823 Family history of stroke: Secondary | ICD-10-CM

## 2021-12-20 DIAGNOSIS — E8721 Acute metabolic acidosis: Secondary | ICD-10-CM | POA: Diagnosis present

## 2021-12-20 DIAGNOSIS — R918 Other nonspecific abnormal finding of lung field: Secondary | ICD-10-CM | POA: Diagnosis not present

## 2021-12-20 DIAGNOSIS — Z888 Allergy status to other drugs, medicaments and biological substances status: Secondary | ICD-10-CM

## 2021-12-20 DIAGNOSIS — Z6835 Body mass index (BMI) 35.0-35.9, adult: Secondary | ICD-10-CM

## 2021-12-20 DIAGNOSIS — E7800 Pure hypercholesterolemia, unspecified: Secondary | ICD-10-CM | POA: Diagnosis present

## 2021-12-20 DIAGNOSIS — R509 Fever, unspecified: Secondary | ICD-10-CM | POA: Diagnosis not present

## 2021-12-20 DIAGNOSIS — Z8249 Family history of ischemic heart disease and other diseases of the circulatory system: Secondary | ICD-10-CM

## 2021-12-20 DIAGNOSIS — I110 Hypertensive heart disease with heart failure: Secondary | ICD-10-CM | POA: Diagnosis not present

## 2021-12-20 DIAGNOSIS — I25110 Atherosclerotic heart disease of native coronary artery with unstable angina pectoris: Principal | ICD-10-CM | POA: Diagnosis present

## 2021-12-20 DIAGNOSIS — Z833 Family history of diabetes mellitus: Secondary | ICD-10-CM

## 2021-12-20 DIAGNOSIS — F419 Anxiety disorder, unspecified: Secondary | ICD-10-CM | POA: Diagnosis present

## 2021-12-20 DIAGNOSIS — Z7982 Long term (current) use of aspirin: Secondary | ICD-10-CM

## 2021-12-20 DIAGNOSIS — Z01818 Encounter for other preprocedural examination: Secondary | ICD-10-CM | POA: Diagnosis not present

## 2021-12-20 DIAGNOSIS — Z7984 Long term (current) use of oral hypoglycemic drugs: Secondary | ICD-10-CM

## 2021-12-20 DIAGNOSIS — E8881 Metabolic syndrome: Secondary | ICD-10-CM | POA: Diagnosis present

## 2021-12-20 DIAGNOSIS — I209 Angina pectoris, unspecified: Secondary | ICD-10-CM | POA: Diagnosis not present

## 2021-12-20 DIAGNOSIS — K760 Fatty (change of) liver, not elsewhere classified: Secondary | ICD-10-CM | POA: Diagnosis not present

## 2021-12-20 DIAGNOSIS — M1990 Unspecified osteoarthritis, unspecified site: Secondary | ICD-10-CM | POA: Diagnosis present

## 2021-12-20 DIAGNOSIS — R0600 Dyspnea, unspecified: Secondary | ICD-10-CM | POA: Diagnosis not present

## 2021-12-20 DIAGNOSIS — J939 Pneumothorax, unspecified: Secondary | ICD-10-CM | POA: Diagnosis not present

## 2021-12-20 DIAGNOSIS — Z820 Family history of epilepsy and other diseases of the nervous system: Secondary | ICD-10-CM

## 2021-12-20 DIAGNOSIS — D62 Acute posthemorrhagic anemia: Secondary | ICD-10-CM | POA: Diagnosis not present

## 2021-12-20 DIAGNOSIS — I2584 Coronary atherosclerosis due to calcified coronary lesion: Secondary | ICD-10-CM | POA: Diagnosis not present

## 2021-12-20 DIAGNOSIS — I10 Essential (primary) hypertension: Secondary | ICD-10-CM | POA: Diagnosis not present

## 2021-12-20 DIAGNOSIS — I5032 Chronic diastolic (congestive) heart failure: Secondary | ICD-10-CM | POA: Diagnosis not present

## 2021-12-20 DIAGNOSIS — I2510 Atherosclerotic heart disease of native coronary artery without angina pectoris: Secondary | ICD-10-CM | POA: Diagnosis not present

## 2021-12-20 DIAGNOSIS — I25119 Atherosclerotic heart disease of native coronary artery with unspecified angina pectoris: Secondary | ICD-10-CM | POA: Diagnosis not present

## 2021-12-20 DIAGNOSIS — D72829 Elevated white blood cell count, unspecified: Secondary | ICD-10-CM | POA: Diagnosis not present

## 2021-12-20 DIAGNOSIS — I340 Nonrheumatic mitral (valve) insufficiency: Secondary | ICD-10-CM | POA: Diagnosis not present

## 2021-12-20 DIAGNOSIS — I97790 Other intraoperative cardiac functional disturbances during cardiac surgery: Secondary | ICD-10-CM | POA: Diagnosis not present

## 2021-12-20 DIAGNOSIS — R739 Hyperglycemia, unspecified: Secondary | ICD-10-CM | POA: Diagnosis not present

## 2021-12-20 DIAGNOSIS — J90 Pleural effusion, not elsewhere classified: Secondary | ICD-10-CM | POA: Diagnosis not present

## 2021-12-20 DIAGNOSIS — E756 Lipid storage disorder, unspecified: Secondary | ICD-10-CM | POA: Diagnosis not present

## 2021-12-20 DIAGNOSIS — J811 Chronic pulmonary edema: Secondary | ICD-10-CM | POA: Diagnosis not present

## 2021-12-20 HISTORY — PX: LEFT HEART CATH AND CORONARY ANGIOGRAPHY: CATH118249

## 2021-12-20 LAB — CBC
HCT: 42.8 % (ref 39.0–52.0)
Hemoglobin: 14.8 g/dL (ref 13.0–17.0)
MCH: 27.1 pg (ref 26.0–34.0)
MCHC: 34.6 g/dL (ref 30.0–36.0)
MCV: 78.2 fL — ABNORMAL LOW (ref 80.0–100.0)
Platelets: 160 10*3/uL (ref 150–400)
RBC: 5.47 MIL/uL (ref 4.22–5.81)
RDW: 13.7 % (ref 11.5–15.5)
WBC: 5.6 10*3/uL (ref 4.0–10.5)
nRBC: 0 % (ref 0.0–0.2)

## 2021-12-20 LAB — GLUCOSE, CAPILLARY
Glucose-Capillary: 132 mg/dL — ABNORMAL HIGH (ref 70–99)
Glucose-Capillary: 131 mg/dL — ABNORMAL HIGH (ref 70–99)
Glucose-Capillary: 174 mg/dL — ABNORMAL HIGH (ref 70–99)
Glucose-Capillary: 153 mg/dL — ABNORMAL HIGH (ref 70–99)
Glucose-Capillary: 172 mg/dL — ABNORMAL HIGH (ref 70–99)

## 2021-12-20 LAB — HEMOGLOBIN A1C
Hgb A1c MFr Bld: 7.4 % — ABNORMAL HIGH (ref 4.8–5.6)
Mean Plasma Glucose: 165.68 mg/dL

## 2021-12-20 LAB — HEPARIN LEVEL (UNFRACTIONATED): Heparin Unfractionated: <0.1 IU/mL — ABNORMAL LOW (ref 0.30–0.70)

## 2021-12-20 LAB — CREATININE, SERUM
Creatinine, Ser: 0.81 mg/dL (ref 0.61–1.24)
GFR, Estimated: >60 mL/min (ref >60)

## 2021-12-20 SURGERY — LEFT HEART CATH AND CORONARY ANGIOGRAPHY
Anesthesia: LOCAL

## 2021-12-20 MED ORDER — IRBESARTAN 75 MG PO TABS
75.0000 mg | ORAL_TABLET | Freq: Every day | ORAL | Status: DC
  Administered 2021-12-20 – 2021-12-21 (×2): 75 mg via ORAL
  Filled 2021-12-20 (×2): qty 1

## 2021-12-20 MED ORDER — HEPARIN SODIUM (PORCINE) 5000 UNIT/ML IJ SOLN: 5000.0000 [IU] | Freq: Three times a day (TID) | INTRAMUSCULAR | Status: DC

## 2021-12-20 MED ORDER — TRANEXAMIC ACID 1000 MG/10ML IV SOLN
1.5000 mg/kg/h | INTRAVENOUS | Status: DC
  Filled 2021-12-20: qty 25

## 2021-12-20 MED ORDER — SODIUM CHLORIDE 0.9 % IV SOLN: 250.0000 mL | INTRAVENOUS | Status: DC | PRN

## 2021-12-20 MED ORDER — MELATONIN 5 MG PO TABS
5.0000 mg | ORAL_TABLET | Freq: Every evening | ORAL | Status: DC | PRN
  Administered 2021-12-20 – 2021-12-22 (×3): 5 mg via ORAL
  Filled 2021-12-20 (×3): qty 1

## 2021-12-20 MED ORDER — NITROGLYCERIN 0.4 MG SL SUBL: 0.4000 mg | SUBLINGUAL_TABLET | SUBLINGUAL | Status: DC | PRN

## 2021-12-20 MED ORDER — CEFAZOLIN SODIUM-DEXTROSE 2-4 GM/100ML-% IV SOLN
2.0000 g | INTRAVENOUS | Status: DC
  Filled 2021-12-20: qty 100

## 2021-12-20 MED ORDER — METOPROLOL SUCCINATE ER 25 MG PO TB24
25.0000 mg | ORAL_TABLET | Freq: Every day | ORAL | Status: DC
  Administered 2021-12-21 – 2021-12-23 (×3): 25 mg via ORAL
  Filled 2021-12-20 (×3): qty 1

## 2021-12-20 MED ORDER — INSULIN ASPART 100 UNIT/ML IJ SOLN
0.0000 [IU] | Freq: Three times a day (TID) | INTRAMUSCULAR | Status: DC
  Administered 2021-12-20 – 2021-12-22 (×5): 3 [IU] via SUBCUTANEOUS
  Administered 2021-12-22: 2 [IU] via SUBCUTANEOUS
  Administered 2021-12-22: 8 [IU] via SUBCUTANEOUS
  Administered 2021-12-23 (×2): 3 [IU] via SUBCUTANEOUS

## 2021-12-20 MED ORDER — SODIUM CHLORIDE 0.9 % IV SOLN: INTRAVENOUS | Status: AC

## 2021-12-20 MED ORDER — IOHEXOL 350 MG/ML SOLN
INTRAVENOUS | Status: DC | PRN
  Administered 2021-12-20: 80 mL

## 2021-12-20 MED ORDER — SODIUM CHLORIDE 0.9% FLUSH
3.0000 mL | Freq: Two times a day (BID) | INTRAVENOUS | Status: DC
  Administered 2021-12-20 – 2021-12-23 (×4): 3 mL via INTRAVENOUS

## 2021-12-20 MED ORDER — EPINEPHRINE HCL 5 MG/250ML IV SOLN IN NS
0.0000 ug/min | INTRAVENOUS | Status: DC
  Filled 2021-12-20: qty 250

## 2021-12-20 MED ORDER — LIDOCAINE HCL (PF) 1 % IJ SOLN
INTRAMUSCULAR | Status: AC
  Filled 2021-12-20: qty 30

## 2021-12-20 MED ORDER — HYDRALAZINE HCL 20 MG/ML IJ SOLN: 10.0000 mg | INTRAMUSCULAR | Status: AC | PRN

## 2021-12-20 MED ORDER — VERAPAMIL HCL 2.5 MG/ML IV SOLN
INTRAVENOUS | Status: AC
  Filled 2021-12-20: qty 2

## 2021-12-20 MED ORDER — MIDAZOLAM HCL 2 MG/2ML IJ SOLN
INTRAMUSCULAR | Status: AC
  Filled 2021-12-20: qty 2

## 2021-12-20 MED ORDER — ASPIRIN 81 MG PO CHEW: 81.0000 mg | CHEWABLE_TABLET | Freq: Every day | ORAL | Status: DC

## 2021-12-20 MED ORDER — TRANEXAMIC ACID (OHS) PUMP PRIME SOLUTION
2.0000 mg/kg | INTRAVENOUS | Status: DC
  Filled 2021-12-20: qty 1.77

## 2021-12-20 MED ORDER — MANNITOL 20 % IV SOLN
INTRAVENOUS | Status: DC
  Filled 2021-12-20: qty 13

## 2021-12-20 MED ORDER — VERAPAMIL HCL 2.5 MG/ML IV SOLN
INTRAVENOUS | Status: DC | PRN
  Administered 2021-12-20: 10 mL via INTRA_ARTERIAL

## 2021-12-20 MED ORDER — ATORVASTATIN CALCIUM 80 MG PO TABS
80.0000 mg | ORAL_TABLET | Freq: Every day | ORAL | Status: DC
  Administered 2021-12-20 – 2021-12-30 (×10): 80 mg via ORAL
  Filled 2021-12-20 (×10): qty 1

## 2021-12-20 MED ORDER — DEXMEDETOMIDINE HCL IN NACL 400 MCG/100ML IV SOLN
0.1000 ug/kg/h | INTRAVENOUS | Status: DC
  Filled 2021-12-20: qty 100

## 2021-12-20 MED ORDER — FENTANYL CITRATE (PF) 100 MCG/2ML IJ SOLN
INTRAMUSCULAR | Status: AC
  Filled 2021-12-20: qty 2

## 2021-12-20 MED ORDER — INSULIN REGULAR(HUMAN) IN NACL 100-0.9 UT/100ML-% IV SOLN
INTRAVENOUS | Status: DC
  Filled 2021-12-20: qty 100

## 2021-12-20 MED ORDER — ACETAMINOPHEN 325 MG PO TABS: 650.0000 mg | ORAL_TABLET | ORAL | Status: DC | PRN

## 2021-12-20 MED ORDER — ASPIRIN 81 MG PO CHEW: 81.0000 mg | CHEWABLE_TABLET | ORAL | Status: DC

## 2021-12-20 MED ORDER — NITROGLYCERIN IN D5W 200-5 MCG/ML-% IV SOLN
2.0000 ug/min | INTRAVENOUS | Status: DC
  Filled 2021-12-20: qty 250

## 2021-12-20 MED ORDER — LABETALOL HCL 5 MG/ML IV SOLN: 10.0000 mg | INTRAVENOUS | Status: AC | PRN

## 2021-12-20 MED ORDER — VANCOMYCIN HCL 1500 MG/300ML IV SOLN
1500.0000 mg | INTRAVENOUS | Status: DC
  Filled 2021-12-20: qty 300

## 2021-12-20 MED ORDER — PLASMA-LYTE A IV SOLN
INTRAVENOUS | Status: DC
  Filled 2021-12-20: qty 2.5

## 2021-12-20 MED ORDER — TRANEXAMIC ACID (OHS) BOLUS VIA INFUSION
15.0000 mg/kg | INTRAVENOUS | Status: DC
  Filled 2021-12-20: qty 1328

## 2021-12-20 MED ORDER — AMLODIPINE BESYLATE 5 MG PO TABS
5.0000 mg | ORAL_TABLET | Freq: Every day | ORAL | Status: DC
  Administered 2021-12-21: 5 mg via ORAL
  Filled 2021-12-20: qty 1

## 2021-12-20 MED ORDER — NOREPINEPHRINE 4 MG/250ML-% IV SOLN
0.0000 ug/min | INTRAVENOUS | Status: DC
  Filled 2021-12-20: qty 250

## 2021-12-20 MED ORDER — PHENYLEPHRINE HCL-NACL 20-0.9 MG/250ML-% IV SOLN
30.0000 ug/min | INTRAVENOUS | Status: DC
  Filled 2021-12-20: qty 250

## 2021-12-20 MED ORDER — HEPARIN SODIUM (PORCINE) 1000 UNIT/ML IJ SOLN
INTRAMUSCULAR | Status: DC | PRN
  Administered 2021-12-20: 5000 [IU] via INTRAVENOUS

## 2021-12-20 MED ORDER — INSULIN ASPART 100 UNIT/ML IJ SOLN
0.0000 [IU] | Freq: Every day | INTRAMUSCULAR | Status: DC
  Administered 2021-12-21: 0 [IU] via SUBCUTANEOUS
  Administered 2021-12-22: 2 [IU] via SUBCUTANEOUS

## 2021-12-20 MED ORDER — ONDANSETRON HCL 4 MG/2ML IJ SOLN: 4.0000 mg | Freq: Four times a day (QID) | INTRAMUSCULAR | Status: DC | PRN

## 2021-12-20 MED ORDER — LIDOCAINE HCL (PF) 1 % IJ SOLN
INTRAMUSCULAR | Status: DC | PRN
  Administered 2021-12-20: 2 mL

## 2021-12-20 MED ORDER — POTASSIUM CHLORIDE 2 MEQ/ML IV SOLN
80.0000 meq | INTRAVENOUS | Status: DC
  Filled 2021-12-20: qty 40

## 2021-12-20 MED ORDER — HEPARIN SODIUM (PORCINE) 1000 UNIT/ML IJ SOLN
INTRAMUSCULAR | Status: AC
  Filled 2021-12-20: qty 10

## 2021-12-20 MED ORDER — SODIUM CHLORIDE 0.9 % WEIGHT BASED INFUSION: 1.0000 mL/kg/h | INTRAVENOUS | Status: DC

## 2021-12-20 MED ORDER — MIDAZOLAM HCL 2 MG/2ML IJ SOLN
INTRAMUSCULAR | Status: DC | PRN
  Administered 2021-12-20 (×2): 1 mg via INTRAVENOUS

## 2021-12-20 MED ORDER — HEPARIN (PORCINE) IN NACL 1000-0.9 UT/500ML-% IV SOLN
INTRAVENOUS | Status: AC
  Filled 2021-12-20: qty 1000

## 2021-12-20 MED ORDER — ASPIRIN 81 MG PO TBEC
81.0000 mg | DELAYED_RELEASE_TABLET | Freq: Every day | ORAL | Status: DC
  Administered 2021-12-21 – 2021-12-23 (×3): 81 mg via ORAL
  Filled 2021-12-20 (×3): qty 1

## 2021-12-20 MED ORDER — MILRINONE LACTATE IN DEXTROSE 20-5 MG/100ML-% IV SOLN
0.3000 ug/kg/min | INTRAVENOUS | Status: DC
  Filled 2021-12-20: qty 100

## 2021-12-20 MED ORDER — FENTANYL CITRATE (PF) 100 MCG/2ML IJ SOLN
INTRAMUSCULAR | Status: DC | PRN
  Administered 2021-12-20: 25 ug via INTRAVENOUS

## 2021-12-20 MED ORDER — SODIUM CHLORIDE 0.9 % WEIGHT BASED INFUSION
3.0000 mL/kg/h | INTRAVENOUS | Status: DC
  Administered 2021-12-20: 3 mL/kg/h via INTRAVENOUS

## 2021-12-20 MED ORDER — HEPARIN (PORCINE) IN NACL 1000-0.9 UT/500ML-% IV SOLN
INTRAVENOUS | Status: DC | PRN
  Administered 2021-12-20 (×2): 500 mL

## 2021-12-20 MED ORDER — ATORVASTATIN CALCIUM 40 MG PO TABS
40.0000 mg | ORAL_TABLET | Freq: Every day | ORAL | Status: DC
Start: 2021-12-20 — End: 2021-12-20

## 2021-12-20 MED ORDER — HEPARIN (PORCINE) 25000 UT/250ML-% IV SOLN
2250.0000 [IU]/h | INTRAVENOUS | Status: DC
Start: 2021-12-20 — End: 2021-12-24
  Administered 2021-12-20: 1250 [IU]/h via INTRAVENOUS
  Administered 2021-12-21: 1950 [IU]/h via INTRAVENOUS
  Administered 2021-12-22 – 2021-12-24 (×5): 2150 [IU]/h via INTRAVENOUS
  Filled 2021-12-20 (×7): qty 250

## 2021-12-20 MED ORDER — HEPARIN 30,000 UNITS/1000 ML (OHS) CELLSAVER SOLUTION
Status: DC
  Filled 2021-12-20: qty 1000

## 2021-12-20 MED ORDER — SODIUM CHLORIDE 0.9% FLUSH: 3.0000 mL | INTRAVENOUS | Status: DC | PRN

## 2021-12-20 MED ORDER — SODIUM CHLORIDE 0.9% FLUSH: 3.0000 mL | Freq: Two times a day (BID) | INTRAVENOUS | Status: DC

## 2021-12-20 SURGICAL SUPPLY — 11 items
BAND ZEPHYR COMPRESS 30 LONG (HEMOSTASIS) IMPLANT
CATH 5FR JL3.5 JR4 ANG PIG MP (CATHETERS) IMPLANT
GLIDESHEATH SLEND A-KIT 6F 22G (SHEATH) IMPLANT
GUIDEWIRE INQWIRE 1.5J.035X260 (WIRE) IMPLANT
INQWIRE 1.5J .035X260CM (WIRE) ×1
KIT HEART LEFT (KITS) ×1 IMPLANT
PACK CARDIAC CATHETERIZATION (CUSTOM PROCEDURE TRAY) ×1 IMPLANT
SHEATH PROBE COVER 6X72 (BAG) IMPLANT
TRANSDUCER W/STOPCOCK (MISCELLANEOUS) ×1 IMPLANT
TUBING CIL FLEX 10 FLL-RA (TUBING) ×1 IMPLANT
WIRE HI TORQ VERSACORE-J 145CM (WIRE) IMPLANT

## 2021-12-20 NOTE — Interval H&P Note (Signed)
Cath Lab Visit (complete for each Cath Lab visit)  Clinical Evaluation Leading to the Procedure:   ACS: No.  Non-ACS:    Anginal Classification: CCS III  Anti-ischemic medical therapy: Minimal Therapy (1 class of medications)  Non-Invasive Test Results: High-risk stress test findings: cardiac mortality >3%/year  Prior CABG: No previous CABG      History and Physical Interval Note:  12/20/2021 7:23 AM  Jonathan Riley  has presented today for surgery, with the diagnosis of abnormal cta.  The various methods of treatment have been discussed with the patient and family. After consideration of risks, benefits and other options for treatment, the patient has consented to  Procedure(s): LEFT HEART CATH AND CORONARY ANGIOGRAPHY (N/A) as a surgical intervention.  The patient's history has been reviewed, patient examined, no change in status, stable for surgery.  I have reviewed the patient's chart and labs.  Questions were answered to the patient's satisfaction.     Lyn Records III

## 2021-12-20 NOTE — Progress Notes (Addendum)
TR BAND REMOVAL  LOCATION:    Right radial  DEFLATED PER PROTOCOL:    Yes.    TIME BAND OFF / DRESSING APPLIED:    1100 am  A clean dry dressing applied with gauze and tegaderm   SITE UPON ARRIVAL:    Level 0  SITE AFTER BAND REMOVAL:    Level 0  CIRCULATION SENSATION AND MOVEMENT:    Within Normal Limits   Yes.    COMMENTS:   Care instructions given to patient.

## 2021-12-20 NOTE — Progress Notes (Addendum)
   Admit order written and pt home meds ordered except metformin. Lipitor increased to 40 mg daily from home dose 10 mg qd.   Carb mod diet and SSI ordered as well.  Theodore Demark, PA-C 12/20/2021 11:30 AM

## 2021-12-20 NOTE — Progress Notes (Signed)
ANTICOAGULATION CONSULT NOTE - Initial Consult  Pharmacy Consult for Heparin Indication: chest pain/ACS  Allergies  Allergen Reactions   Percocet [Oxycodone-Acetaminophen] Nausea And Vomiting   Crestor [Rosuvastatin]     Other reaction(s): myalgia/arthralgia (06/2020)    Patient Measurements: Height: 5\' 10"  (177.8 cm) Weight: 111.7 kg (246 lb 4.1 oz) IBW/kg (Calculated) : 73 Heparin Dosing Weight: 97.4 kg   Vital Signs: Temp: 97.4 F (36.3 C) (09/01 0557) Temp Source: Oral (09/01 0557) BP: 168/81 (09/01 1046) Pulse Rate: 60 (09/01 1046)  Labs: Recent Labs    12/20/21 1210  HGB 14.8  HCT 42.8  PLT 160  CREATININE 0.81    Estimated Creatinine Clearance: 121.4 mL/min (by C-G formula based on SCr of 0.81 mg/dL).   Medical History: Past Medical History:  Diagnosis Date   Arthritis    Depression    Diabetes mellitus without complication (HCC)    Dyspnea    "with exertion"   Hypertension    Nocturia    1-2 times during night   PONV (postoperative nausea and vomiting)    "nausea after a knee surgery"    Assessment: 61 yo male presented on 12/20/21 for CAD. S/p cath today with 25-305 stenosis and possible catheter induced dissection/trauma, CVTS consulted and plan for surgery on 9/5. Pharmacy consulted to dose heparin and start 6 hr after sheath removal at ~830am   Goal of Therapy:  Heparin level 0.3-0.7 units/ml Monitor platelets by anticoagulation protocol: Yes   Plan:  Start heparin 1250 units/hr now since > 6 hrs since sheath removal  Check 6 hr heparin level  Monitor heparin level, CBC and s/s of bleeding daily   11/5, PharmD, BCPS Clinical Pharmacist 12/20/2021 3:38 PM

## 2021-12-20 NOTE — CV Procedure (Signed)
Mid left main possibly catheter induced dissection.  Less than 30% narrowing, TIMI grade III flow.  CTA demonstrated 25% plaque in the left main.  No mention of dissection. Moderate mid LAD disease, segmental in the 50 to 70% range. Circumflex is highly disease with first obtuse marginal containing segmental 50 to 70% stenosis, second marginal high-grade stenosis before 2 small branches after bifurcating and total occlusion of the distal circumflex with left to left collaterals. High-grade obstruction in the mid RCA, ruptured plaque. LVEDP 28 mmHg, EF 55%.  Diastolic heart failure.

## 2021-12-20 NOTE — Progress Notes (Signed)
Jonathan Riley,Jonathan Riley [place] Medical Record [id number] Date of Birth: 1960/10/17  Referring: Daneen Schick, MD Primary Care: Gaynelle Arabian, MD Primary Cardiologist:Bridgette Harrell Gave, MD  Reason for consult: Evaluation for potential surgical management of multivessel coronary artery disease   History of Present Illness:     Jonathan Riley is a 61 year old male with a past history of hypertension, dyslipidemia, and type 2 diabetes mellitus who recently sought medical attention for evaluation of dyspnea on exertion.  He was seen by Dr. Al Pimple who recommended coronary CTA.  That study demonstrated  a coronary calcium score of 1633 which was in the 98th percentile for his demographic control.  Further evaluation with left heart catheterization was recommended and was carried out electively earlier today.  This demonstrates severe three-vessel coronary artery disease which is detailed in the report below.  In addition, the cardiology team felt there was possibly a left main coronary dissection that occurred on the initial injection of contrast. An echocardiogram obtained last month showed preserved left ventricular function with an ejection fraction of 60 to 65%.  The LVEDP was 26.  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 Currently, Jonathan Riley is currently resting comfortably and is pain-free.  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  He is right handed.   Current Activity/ Functional Status: Patient is independent with mobility/ambulation, transfers, ADL's, IADL's.   Zubrod Score: At the time of surgery this patient's most appropriate activity status/level should be described as: []     0    Normal activity, no symptoms [x]     1    Restricted in physical strenuous activity but  ambulatory, able to do out light work []     2    Ambulatory and capable of self care, unable to do work activities, up and about                 more than 50%  Of the time                            []     3    Only limited self care, in bed greater than 50% of waking hours []     4    Completely disabled, no self care, confined to bed or chair []     5    Moribund  Past Medical History:  Diagnosis Date   Arthritis    Depression    Diabetes mellitus without complication (HCC)    Dyspnea    "with exertion"   Hypertension    Nocturia    1-2 times during night   PONV (postoperative nausea and vomiting)    "nausea after a knee surgery"    Past Surgical History:  Procedure Laterality Date   COLONOSCOPY     KNEE SURGERY Bilateral    Right, Left x2   TOTAL HIP ARTHROPLASTY Right 03/03/2016   Procedure: TOTAL HIP ARTHROPLASTY ANTERIOR APPROACH;  Surgeon: Rod Can, MD;  Location: [facility];  Service: Orthopedics;  Laterality: Right;   WRIST SURGERY Right 2008 or 2009    Social History   Tobacco Use  Smoking Status Former  Types: Cigarettes  Smokeless Tobacco Never  Tobacco Comments   "quit about 12 years ago" 02/26/2016    Social History   Substance and Sexual Activity  Alcohol Use No     Allergies  Allergen Reactions   Percocet [Oxycodone-Acetaminophen] Nausea And Vomiting   Crestor [Rosuvastatin]     Other reaction(s): myalgia/arthralgia (06/2020)    Current Facility-Administered Medications  Medication Dose Route Frequency Provider Last Rate Last Admin   0.9 %  sodium chloride infusion   Intravenous Continuous Lyn Records, MD 100 mL/hr at 12/20/21 1022 100 mL/hr at 12/20/21 1022   0.9 %  sodium chloride infusion  250 mL Intravenous PRN Lyn Records, MD       acetaminophen (TYLENOL) tablet 650 mg  650 mg Oral Q4H PRN Lyn Records, MD       amLODipine (NORVASC) tablet 5 mg  5 mg Oral Daily Barrett, Joline Salt, PA-C       aspirin EC tablet 81 mg  81 mg Oral Daily  Barrett, Rhonda G, PA-C       atorvastatin (LIPITOR) tablet 80 mg  80 mg Oral Daily Lyn Records, MD       heparin injection 5,000 Units  5,000 Units Subcutaneous Q8H Lyn Records, MD       hydrALAZINE (APRESOLINE) injection 10 mg  10 mg Intravenous Q20 Min PRN Lyn Records, MD       insulin aspart (novoLOG) injection 0-15 Units  0-15 Units Subcutaneous TID WC Barrett, Rhonda G, PA-C       insulin aspart (novoLOG) injection 0-5 Units  0-5 Units Subcutaneous QHS Barrett, Rhonda G, PA-C       irbesartan (AVAPRO) tablet 75 mg  75 mg Oral Daily Barrett, Rhonda G, PA-C       labetalol (NORMODYNE) injection 10 mg  10 mg Intravenous Q10 min PRN Lyn Records, MD       metoprolol succinate (TOPROL-XL) 24 hr tablet 25 mg  25 mg Oral Daily Barrett, Rhonda G, PA-C       ondansetron (ZOFRAN) injection 4 mg  4 mg Intravenous Q6H PRN Lyn Records, MD       sodium chloride flush (NS) 0.9 % injection 3 mL  3 mL Intravenous Q12H Lyn Records, MD       sodium chloride flush (NS) 0.9 % injection 3 mL  3 mL Intravenous PRN Lyn Records, MD        Medications Prior to Admission  Medication Sig Dispense Refill Last Dose   amLODipine (NORVASC) 5 MG tablet Take 5 mg by mouth daily.   12/20/2021   aspirin EC 81 MG tablet Take 1 tablet (81 mg total) by mouth daily. Swallow whole. 90 tablet 3 12/20/2021 at 0400   atorvastatin (LIPITOR) 10 MG tablet Take 1 tablet (10 mg total) by mouth daily. 30 tablet 11 12/20/2021   metFORMIN (GLUCOPHAGE-XR) 500 MG 24 hr tablet Take 500 mg by mouth daily.   12/19/2021 at 1800   metoprolol succinate (TOPROL XL) 25 MG 24 hr tablet Take 1 tablet (25 mg total) by mouth daily. 90 tablet 3 12/20/2021 at 0400   nitroGLYCERIN (NITROSTAT) 0.4 MG SL tablet Place 1 tablet (0.4 mg total) under the tongue every 5 (five) minutes as needed for chest pain. 25 tablet 3    OVER THE COUNTER MEDICATION Take 1 capsule by mouth daily. Maka Root   12/20/2021   telmisartan (MICARDIS) 80 MG tablet Take 80 mg  by mouth daily.   12/20/2021 at 0400    Family History  Problem Relation Age of Onset   Heart disease Unknown    Diabetes Unknown    Cancer Unknown      Review of Systems:     Cardiac Review of Systems: Y or  [    ]= no  Chest Pain [    ] Resting SOB [   ]  Exertional SOB  [ x ]  Orthopnea [  ]   Pedal Edema [   ]    Palpitations [  ] Syncope  [  ]   Presyncope [   ]  General Review of Systems: [Y] = yes [  ]=no Constitional: recent weight change [  ]; anorexia [  ]; fatigue [  ]; nausea [  ]; night sweats [  ]; fever [  ]; or chills [  ]                                                               Dental: Last Dentist visit: Pt is edentulous and has full upper and lower dentures  Eye : blurred vision [  ]; diplopia [   ]; vision changes [  ];  Amaurosis fugax[  ]; Resp: cough [  ];  wheezing[  ];  hemoptysis[  ]; shortness of breath[ x ]; paroxysmal nocturnal dyspnea[  ]; dyspnea on exertion[  ]; or orthopnea[  ];  GI:  gallstones[  ], vomiting[  ];  dysphagia[  ]; melena[  ];  hematochezia [  ]; heartburn[  ];   Hx of  Colonoscopy[  ]; GU: kidney stones [  ]; hematuria[  ];   dysuria [  ];  nocturia[  ];  history of     obstruction [  ]; urinary frequency [  ]             Skin: rash, swelling[  ];, hair loss[  ];  peripheral edema[  ];  or itching[  ]; Musculosketetal: myalgias[ x, with statins ];  joint swelling[  ];  joint erythema[  ];  joint pain[  ];  back pain[  ];  Heme/Lymph: bruising[  ];  bleeding[  ];  anemia[  ];  Neuro: TIA[  ];  headaches[  ];  stroke[  ];  vertigo[  ];  seizures[  ];   paresthesias[  ];  difficulty walking[  ];  Psych:depression[  ]; anxiety[  ];  Endocrine: diabetes[ x];  thyroid dysfunction[  ];                  Physical Exam: BP (!) 168/81   Pulse 60   Temp (!) 97.4 F (36.3 C) (Oral)   Resp (!) 23   Ht 5\' 10"  (1.778 m)   Wt 111.1 kg   SpO2 97%   BMI 35.15 kg/m    General appearance: alert, cooperative, and no distress Head:  Normocephalic, without obvious abnormality, atraumatic Neck: no adenopathy, no carotid bruit, no JVD, and supple, symmetrical, trachea midline Lymph nodes: no cervical or clavicular adenopathy Resp: clear to auscultation bilaterally Cardio: RRR without murmur GI: soft, non-tender, no palpable mass Extremities: s/p left total knee replacement. No deformities or peripheral edema. Distal pulses are palpable. No  LE varicosities. A modified Allen's test was conducted to the left upper extremity using pulse oximetry probe applied to the thumb.  There was no degradation of the oximetry waveform with occlusion of the left radial artery.  Neurologic: Grossly normal  Diagnostic Studies & Laboratory data:    LEFT HEART CATH AND CORONARY ANGIOGRAPHY   Conclusion  CONCLUSIONS: Left main trunk 25 to 30% stenosis and possibly catheter induced dissection/trauma at the time of first injection. Proximal to mid segmental 60 to 70% narrowing. Severely diseased circumflex with 70% first obtuse marginal 80% segmental second marginal and total occlusion of the distal circumflex. High-grade mid RCA stenosis, greater than 95% with TIMI-3 flow.  Proximal RCA and PDA 60 to 70% stenoses. Severe elevation in LVEDP, 26 mmHg.  Systolic function is normal.   Recommendation:   Observation overnight because of left main situation. Consult colleagues and also have TCT [place] to look at the patient's pictures to help develop a treatment plan.  Because of severity of RCA stenosis we need to revascularize in some form or fashion within the next several days to 1 week. Potentially, if left main is a ruptured plaque, we should consider coronary bypass grafting.  If not, consider PCI of RCA and medical treatment of the circumflex territory with clinical observation of left coronary and left main disease. In the meantime, we should treat diastolic heart failure aggressively. Coronary Findings  Diagnostic Dominance: Right Left  Main  Vessel is angiographically normal.  Mid LM lesion is 35% stenosed.    Left Anterior Descending  The vessel exhibits minimal luminal irregularities.  Prox LAD to Mid LAD lesion is 65% stenosed.    Left Circumflex  Mid Cx to Dist Cx lesion is 100% stenosed.    First Obtuse Marginal Branch  1st Mrg lesion is 70% stenosed.    Second Obtuse Marginal Branch  2nd Mrg lesion is 80% stenosed.    Third Obtuse Marginal Branch  Collaterals  3rd Mrg filled by collaterals from 2nd Mrg.      Right Coronary Artery  There is moderate diffuse disease throughout the vessel.  Prox RCA lesion is 65% stenosed.  Prox RCA to Mid RCA lesion is 99% stenosed.    Right Posterior Descending Artery  RPDA lesion is 60% stenosed.    Intervention   No interventions have been documented.   Wall Motion              Left Heart  Left Ventricle The left ventricular size is normal. LV end diastolic pressure is severely elevated. The left ventricular ejection fraction is 50-55% by visual estimate. No regional wall motion abnormalities.   Coronary Diagrams   Diagnostic Dominance: Right        Surgeon Notes    12/20/2021  8:26 AM CV Procedure signed by Lyn Records, MD   Procedural Details  Technical Details The right radial area was sterilely prepped and draped. Intravenous sedation with Versed and fentanyl was administered. 1% Xylocaine was infiltrated to achieve local analgesia. Using real-time vascular ultrasound, a double wall stick with an angiocath was utilized to obtain intra-arterial access. A VUS image was saved for the record.The modified Seldinger technique was used to place a 51F " Slender" sheath in the right radial artery. Weight based heparin was administered. Coronary angiography was done using 5 F catheters. Right coronary angiography was performed with a JR4. Left ventricular hemodymic recordings and angiography was done using the JR 4 catheter and hand injection. Left  coronary angiography  was performed with a JL 3.5 cm.  Hemostasis was achieved using a pneumatic band.  During this procedure the patient is administered a total of Versed 2 mg and Fentanyl 25 mcg to achieve and maintain moderate conscious sedation.  The patient's heart rate, blood pressure, and oxygen saturation are monitored continuously during the procedure. The period of conscious sedation is 35 minutes, of which I was present face-to-face 100% of this time.  Estimated blood loss <50 mL.   During this procedure medications were administered to achieve and maintain moderate conscious sedation while the patient's heart rate, blood pressure, and oxygen saturation were continuously monitored and I was present face-to-face 100% of this time.    ECHOCARDIOGRAM REPORT         Patient Name:   Jonathan Riley Date of Exam: 11/27/2021  Medical Rec #:  [id number]       Height:       70.0 in  Accession #:    [id number]      Weight:       247.8 lb  Date of Birth:  1961/02/21      BSA:          2.286 m  Patient Age:    14 years        BP:           160/78 mmHg  Patient Gender: M               HR:           60 bpm.  Exam Location:  Outpatient   Procedure: 2D Echo, 3D Echo, Color Doppler, Cardiac Doppler and Strain  Analysis   Indications:    Dyspnea on exertion     History:        Patient has no prior history of Echocardiogram  examinations.                  Risk Factors:Dyslipidemia, Hypertension, Former Smoker and                  Diabetes.     Sonographer:    Leavy Cella RDCS  Referring Phys: [id number] [place]     1. Left ventricular ejection fraction, by estimation, is 60 to 65%. The  left ventricle has normal function. The left ventricle has no regional  wall motion abnormalities. There is moderate left ventricular hypertrophy.  Left ventricular diastolic  parameters are indeterminate.   2. Right ventricular systolic function is normal. The right  ventricular  size is normal. Tricuspid regurgitation signal is inadequate for assessing  PA pressure.   3. The mitral valve is normal in structure. Trivial mitral valve  regurgitation. No evidence of mitral stenosis.   4. The aortic valve was not well visualized. Aortic valve regurgitation  is trivial. No aortic stenosis is present.   5. Aortic dilatation noted. There is dilatation of the ascending aorta,  measuring 43 mm.   FINDINGS   Left Ventricle: Left ventricular ejection fraction, by estimation, is 60  to 65%. The left ventricle has normal function. The left ventricle has no  regional wall motion abnormalities. The left ventricular internal cavity  size was normal in size. There is   moderate left ventricular hypertrophy. Left ventricular diastolic  parameters are indeterminate.   Right Ventricle: The right ventricular size is normal. No increase in  right ventricular wall thickness. Right ventricular systolic function is  normal. Tricuspid regurgitation signal is inadequate for assessing PA  pressure.   Left Atrium: Left atrial size was normal in size.   Right Atrium: Right atrial size was normal in size.   Pericardium: There is no evidence of pericardial effusion.   Mitral Valve: The mitral valve is normal in structure. Trivial mitral  valve regurgitation. No evidence of mitral valve stenosis.   Tricuspid Valve: The tricuspid valve is normal in structure. Tricuspid  valve regurgitation is trivial.   Aortic Valve: The aortic valve was not well visualized. Aortic valve  regurgitation is trivial. No aortic stenosis is present.   Pulmonic Valve: The pulmonic valve was not well visualized. Pulmonic valve  regurgitation is not visualized.   Aorta: The aortic root is normal in size and structure and aortic  dilatation noted. There is dilatation of the ascending aorta, measuring 43  mm.   IAS/Shunts: The interatrial septum was not well visualized.   Oswaldo Milian  MD  Electronically signed by Oswaldo Milian MD  Signature Date/Time: 11/27/2021/11:52:09 PM       Recent Radiology Findings:     I have independently reviewed the above radiologic studies and discussed with the patient   Recent Lab Findings: Lab Results  Component Value Date   WBC 5.9 12/10/2021   HGB 15.7 12/10/2021   HCT 47.8 12/10/2021   PLT 192 12/10/2021   GLUCOSE 214 (H) 12/10/2021   CHOL 196 12/10/2021   TRIG 246 (H) 12/10/2021   HDL 30 (L) 12/10/2021   LDLCALC 122 (H) 12/10/2021   ALT 29 04/14/2021   AST 21 04/14/2021   NA 136 12/10/2021   K 4.6 12/10/2021   CL 99 12/10/2021   CREATININE 0.93 12/10/2021   BUN 8 12/10/2021   CO2 21 12/10/2021   TSH 0.663 05/07/2017      Assessment / Plan:       -Coronary artery disease: Jonathan Riley has three-vessel coronary artery disease and exertional angina with preserved function.  He would be best served with multivessel coronary bypass grafting.  He is currently stable and pain-free at rest.  The procedure and expected perioperative recovery was discussed with Jonathan Riley and his wife who was at the bedside today.  Their questions were answered.  They would like for [place] to proceed with plans for surgery next week.  He is tentatively scheduled for Tuesday, September 5 by Dr. Kipp Brood.  There was some concern about catheter-associated dissection of the left main coronary artery at the time of initial injection.  Given this finding along with multiple high-grade stenoses, he may best served by being anticoagulated with heparin while awaiting surgery.  Will defer to cardiology.  -Dyslipidemia: Historically has not tolerated statins well due to myalgias.  He is currently on Lipitor.  -Hypertension: Managed with amlodipine, Toprol-XL, and telmisartan.  Recommend holding the ARB 48 hours prior to surgery to minimize potential for renal complications  -Type 2 diabetes mellitus: Taking metformin 500 mg daily prior to admission.  He  said his last hemoglobin A1c was less than 7.    I  spent 30 minutes counseling the patient face to face.  Antony Odea, PA-C  12/20/2021 12:22 PM

## 2021-12-21 ENCOUNTER — Inpatient Hospital Stay (HOSPITAL_COMMUNITY): Payer: BC Managed Care – PPO

## 2021-12-21 DIAGNOSIS — I2510 Atherosclerotic heart disease of native coronary artery without angina pectoris: Secondary | ICD-10-CM | POA: Diagnosis not present

## 2021-12-21 LAB — GLUCOSE, CAPILLARY
Glucose-Capillary: 161 mg/dL — ABNORMAL HIGH (ref 70–99)
Glucose-Capillary: 179 mg/dL — ABNORMAL HIGH (ref 70–99)
Glucose-Capillary: 154 mg/dL — ABNORMAL HIGH (ref 70–99)
Glucose-Capillary: 186 mg/dL — ABNORMAL HIGH (ref 70–99)
Glucose-Capillary: 182 mg/dL — ABNORMAL HIGH (ref 70–99)

## 2021-12-21 LAB — CBC
HCT: 43.5 % (ref 39.0–52.0)
Hemoglobin: 15 g/dL (ref 13.0–17.0)
MCH: 26.9 pg (ref 26.0–34.0)
MCHC: 34.5 g/dL (ref 30.0–36.0)
MCV: 78.1 fL — ABNORMAL LOW (ref 80.0–100.0)
Platelets: 175 10*3/uL (ref 150–400)
RBC: 5.57 MIL/uL (ref 4.22–5.81)
RDW: 13.7 % (ref 11.5–15.5)
WBC: 6.5 10*3/uL (ref 4.0–10.5)
nRBC: 0 % (ref 0.0–0.2)

## 2021-12-21 LAB — HEPARIN LEVEL (UNFRACTIONATED)
Heparin Unfractionated: <0.1 IU/mL — ABNORMAL LOW (ref 0.30–0.70)
Heparin Unfractionated: 0.16 IU/mL — ABNORMAL LOW (ref 0.30–0.70)
Heparin Unfractionated: 0.34 IU/mL (ref 0.30–0.70)

## 2021-12-21 MED ORDER — AMLODIPINE BESYLATE 5 MG PO TABS
5.0000 mg | ORAL_TABLET | Freq: Once | ORAL | Status: AC
Start: 2021-12-21 — End: 2021-12-21
  Administered 2021-12-21: 5 mg via ORAL
  Filled 2021-12-21: qty 1

## 2021-12-21 MED ORDER — AMLODIPINE BESYLATE 10 MG PO TABS
10.0000 mg | ORAL_TABLET | Freq: Every day | ORAL | Status: DC
  Administered 2021-12-22 – 2021-12-23 (×2): 10 mg via ORAL
  Filled 2021-12-21 (×2): qty 1

## 2021-12-21 NOTE — Progress Notes (Signed)
VASCULAR LAB    Pre CABG Dopplers have been performed.  See CV proc for preliminary results.   [name], RVT 12/21/2021, 3:37 PM

## 2021-12-21 NOTE — Progress Notes (Signed)
ANTICOAGULATION CONSULT NOTE   Pharmacy Consult for Heparin Indication: chest pain/ACS  Allergies  Allergen Reactions   Percocet [Oxycodone-Acetaminophen] Nausea And Vomiting   Crestor [Rosuvastatin]     Other reaction(s): myalgia/arthralgia (06/2020)    Patient Measurements: Height: 5\' 10"  (177.8 cm) Weight: 111.4 kg (245 lb 9.5 oz) IBW/kg (Calculated) : 73 Heparin Dosing Weight: 97.4 kg   Vital Signs: Temp: 98.4 F (36.9 C) (09/02 1910) Temp Source: Oral (09/02 1910) BP: 149/88 (09/02 1910) Pulse Rate: 67 (09/02 1910)  Labs: Recent Labs    12/20/21 1210 12/20/21 2252 12/21/21 0705 12/21/21 1443 12/21/21 2230  HGB 14.8  --  15.0  --   --   HCT 42.8  --  43.5  --   --   PLT 160  --  175  --   --   HEPARINUNFRC  --    < > <0.10* 0.16* 0.34  CREATININE 0.81  --   --   --   --    < > = values in this interval not displayed.     Estimated Creatinine Clearance: 121.3 mL/min (by C-G formula based on SCr of 0.81 mg/dL).   Medical History: Past Medical History:  Diagnosis Date   Arthritis    Depression    Diabetes mellitus without complication (HCC)    Dyspnea    "with exertion"   Hypertension    Nocturia    1-2 times during night   PONV (postoperative nausea and vomiting)    "nausea after a knee surgery"    Assessment: 61 yo male presented on 12/20/21 for CAD. S/p cath 9/1 with 25-305 stenosis and possible catheter induced dissection/trauma, CVTS consulted and plan for surgery on 9/5. Pharmacy consulted to dose heparin.  Heparin level therpaeutic: 0.34 on 2150 units/hr.  No infusion issues or overt bleeding reported.  Goal of Therapy:  Heparin level 0.3-0.7 units/ml Monitor platelets by anticoagulation protocol: Yes   Plan:  Continue heparin at 2150 units/hr  Check 8 hr heparin level  Monitor heparin level, CBC and s/s of bleeding daily   2151, PharmD Clinical Pharmacist 12/21/2021 11:45 PM Please check AMION for all [facility]  numbers

## 2021-12-21 NOTE — Progress Notes (Signed)
ANTICOAGULATION CONSULT NOTE - Initial Consult  Pharmacy Consult for Heparin Indication: chest pain/ACS  Allergies  Allergen Reactions   Percocet [Oxycodone-Acetaminophen] Nausea And Vomiting   Crestor [Rosuvastatin]     Other reaction(s): myalgia/arthralgia (06/2020)    Patient Measurements: Height: 5\' 10"  (177.8 cm) Weight: 111.4 kg (245 lb 9.5 oz) IBW/kg (Calculated) : 73 Heparin Dosing Weight: 97.4 kg   Vital Signs: Temp: 98.1 F (36.7 C) (09/02 0812) Temp Source: Oral (09/02 0812) BP: 142/79 (09/02 1152) Pulse Rate: 60 (09/02 0812)  Labs: Recent Labs    12/20/21 1210 12/20/21 2252 12/21/21 0705 12/21/21 1443  HGB 14.8  --  15.0  --   HCT 42.8  --  43.5  --   PLT 160  --  175  --   HEPARINUNFRC  --  <0.10* <0.10* 0.16*  CREATININE 0.81  --   --   --      Estimated Creatinine Clearance: 121.3 mL/min (by C-G formula based on SCr of 0.81 mg/dL).   Medical History: Past Medical History:  Diagnosis Date   Arthritis    Depression    Diabetes mellitus without complication (HCC)    Dyspnea    "with exertion"   Hypertension    Nocturia    1-2 times during night   PONV (postoperative nausea and vomiting)    "nausea after a knee surgery"    Assessment: 61 yo male presented on 12/20/21 for CAD. S/p cath 9/1 with 25-305 stenosis and possible catheter induced dissection/trauma, CVTS consulted and plan for surgery on 9/5. Pharmacy consulted to dose heparin.  Heparin level came back a bit higher but still subtherapeutic. We will increase rate and check level in 6 hr.   Goal of Therapy:  Heparin level 0.3-0.7 units/ml Monitor platelets by anticoagulation protocol: Yes   Plan:  Increase heparin to 2150 units/hr  Check 6 hr heparin level  Monitor heparin level, CBC and s/s of bleeding daily   2151, PharmD, [place], AAHIVP, CPP Infectious Disease Pharmacist 12/21/2021 4:18 PM

## 2021-12-21 NOTE — Progress Notes (Signed)
CARDIAC REHAB PHASE I     Pre-op OHS teaching including OHS booklet/handout, move in tub, IS use, home needs post op, pain management, and sternal precautions discussed. All questions and concerns addressed. Able to demonstrate IS reaching 2500 today. Will continue to follow.   [id number]   Woodroe Chen, RN BSN 12/21/2021 10:45 AM

## 2021-12-21 NOTE — Progress Notes (Signed)
Rounding Note    Patient Name: Jonathan Riley Date of Encounter: 12/21/2021  [facility] Cardiologist: Jodelle Red, MD   Subjective   Plan for CABG Sept 5, on IV heparin. No CP or SOB.  Inpatient Medications    Scheduled Meds:  amLODipine  5 mg Oral Daily   aspirin EC  81 mg Oral Daily   atorvastatin  80 mg Oral Daily   insulin aspart  0-15 Units Subcutaneous TID WC   insulin aspart  0-5 Units Subcutaneous QHS   irbesartan  75 mg Oral Daily   metoprolol succinate  25 mg Oral Daily   sodium chloride flush  3 mL Intravenous Q12H   Continuous Infusions:  sodium chloride     heparin 1,950 Units/hr (12/21/21 0931)   PRN Meds: sodium chloride, acetaminophen, melatonin, nitroGLYCERIN, ondansetron (ZOFRAN) IV, sodium chloride flush   Vital Signs    Vitals:   12/20/21 2350 12/21/21 0359 12/21/21 0400 12/21/21 0812  BP: (!) 158/91 (!) 151/80 (!) 151/80 (!) 155/84  Pulse: 67 63 64 60  Resp:  20  18  Temp: 98.5 F (36.9 C) 98.1 F (36.7 C)  98.1 F (36.7 C)  TempSrc: Oral Oral  Oral  SpO2: 98% 94% 97% 96%  Weight:  111.4 kg    Height:        Intake/Output Summary (Last 24 hours) at 12/21/2021 1035 Last data filed at 12/20/2021 2200 Gross per 24 hour  Intake 1394.69 ml  Output --  Net 1394.69 ml      12/21/2021    3:59 AM 12/20/2021   12:24 PM 12/20/2021    5:57 AM  Last 3 Weights  Weight (lbs) 245 lb 9.5 oz 246 lb 4.1 oz 245 lb  Weight (kg) 111.4 kg 111.7 kg 111.131 kg      Telemetry    SR - Personally Reviewed  ECG    No new - Personally Reviewed  Physical Exam   GEN: No acute distress.   Neck: No JVD Cardiac: RRR, no murmurs, rubs, or gallops.  Respiratory: Clear to auscultation bilaterally. GI: Soft, nontender, non-distended  MS: No edema; No deformity. Neuro:  Nonfocal  Psych: Normal affect   Labs    High Sensitivity Troponin:  No results for input(s): "TROPONINIHS" in the last 720 hours.   Chemistry Recent Labs  Lab  12/20/21 1210  CREATININE 0.81  GFRNONAA >60    Lipids No results for input(s): "CHOL", "TRIG", "HDL", "LABVLDL", "LDLCALC", "CHOLHDL" in the last 168 hours.  Hematology Recent Labs  Lab 12/20/21 1210 12/21/21 0705  WBC 5.6 6.5  RBC 5.47 5.57  HGB 14.8 15.0  HCT 42.8 43.5  MCV 78.2* 78.1*  MCH 27.1 26.9  MCHC 34.6 34.5  RDW 13.7 13.7  PLT 160 175   Thyroid No results for input(s): "TSH", "FREET4" in the last 168 hours.  BNPNo results for input(s): "BNP", "PROBNP" in the last 168 hours.  DDimer No results for input(s): "DDIMER" in the last 168 hours.   Radiology    CARDIAC CATHETERIZATION  Result Date: 12/20/2021 CONCLUSIONS: Left main trunk 25 to 30% stenosis and possibly catheter induced dissection/trauma at the time of first injection. Proximal to mid segmental 60 to 70% narrowing. Severely diseased circumflex with 70% first obtuse marginal 80% segmental second marginal and total occlusion of the distal circumflex. High-grade mid RCA stenosis, greater than 95% with TIMI-3 flow.  Proximal RCA and PDA 60 to 70% stenoses. Severe elevation in LVEDP, 26 mmHg.  Systolic  function is normal. Recommendation: Observation overnight because of left main situation. Consult colleagues and also have TCT [place] to look at the patient's pictures to help develop a treatment plan.  Because of severity of RCA stenosis we need to revascularize in some form or fashion within the next several days to 1 week. Potentially, if left main is a ruptured plaque, we should consider coronary bypass grafting.  If not, consider PCI of RCA and medical treatment of the circumflex territory with clinical observation of left coronary and left main disease. In the meantime, we should treat diastolic heart failure aggressively.    Cardiac Studies   Cath as above  Patient Profile     61 y.o. male with a hx of hypertension, type II diabetes, hyperlipidemia, found to have multivessel CAD, planned for CABG  Assessment &  Plan    Principal Problem:   Unstable angina (HCC) Active Problems:   Coronary artery calcification seen on CT scan   CAD (coronary artery disease)   CAD (coronary artery disease), native coronary artery  CAD planning for CABG HTN HLD - continue heparin gtt -CABG planned for sept 5 - cont asa 81 mg daily - continue atorvastatin 80 mg daily - insulin SS - on ARB but will hold 2 days prior to CABG, stop tomorrow - cont BB - cont amlodipine, may need to uptitrate if stopping ARB preop.    For questions or updates, please contact [facility] Please consult [email or web address] for contact info under        Signed, Parke Poisson, MD  12/21/2021, 10:35 AM

## 2021-12-21 NOTE — Progress Notes (Signed)
ANTICOAGULATION CONSULT NOTE - Initial Consult  Pharmacy Consult for Heparin Indication: chest pain/ACS  Allergies  Allergen Reactions   Percocet [Oxycodone-Acetaminophen] Nausea And Vomiting   Crestor [Rosuvastatin]     Other reaction(s): myalgia/arthralgia (06/2020)    Patient Measurements: Height: 5\' 10"  (177.8 cm) Weight: 111.4 kg (245 lb 9.5 oz) IBW/kg (Calculated) : 73 Heparin Dosing Weight: 97.4 kg   Vital Signs: Temp: 98.1 F (36.7 C) (09/02 0359) Temp Source: Oral (09/02 0359) BP: 151/80 (09/02 0359) Pulse Rate: 63 (09/02 0359)  Labs: Recent Labs    12/20/21 1210 12/20/21 2252  HGB 14.8  --   HCT 42.8  --   PLT 160  --   HEPARINUNFRC  --  <0.10*  CREATININE 0.81  --      Estimated Creatinine Clearance: 121.3 mL/min (by C-G formula based on SCr of 0.81 mg/dL).   Medical History: Past Medical History:  Diagnosis Date   Arthritis    Depression    Diabetes mellitus without complication (HCC)    Dyspnea    "with exertion"   Hypertension    Nocturia    1-2 times during night   PONV (postoperative nausea and vomiting)    "nausea after a knee surgery"    Assessment: 61 yo male presented on 12/20/21 for CAD. S/p cath 9/1 with 25-305 stenosis and possible catheter induced dissection/trauma, CVTS consulted and plan for surgery on 9/5. Pharmacy consulted to dose heparin.  Heparin level today is subtherapeutic at <0.10, on 1600 units/hr. Hgb 15, plt 175. No line issues or signs/symptoms of bleeding per RN.  Goal of Therapy:  Heparin level 0.3-0.7 units/ml Monitor platelets by anticoagulation protocol: Yes   Plan:  Increase heparin to 1950 units/hr  Check 6 hr heparin level  Monitor heparin level, CBC and s/s of bleeding daily   11/5, PharmD PGY1 Pharmacy Resident 9/2/20238:35 AM

## 2021-12-21 NOTE — Progress Notes (Signed)
ANTICOAGULATION CONSULT NOTE - Follow Up Consult  Pharmacy Consult for heparin Indication:  CAD awaiting CABG  Labs: Recent Labs    12/20/21 1210 12/20/21 2252  HGB 14.8  --   HCT 42.8  --   PLT 160  --   HEPARINUNFRC  --  <0.10*  CREATININE 0.81  --     Assessment: 60yo male subtherapeutic on heparin after started s/p cardiac cath; no infusion issues or signs of bleeding per RN.  Goal of Therapy:  Heparin level 0.3-0.7 units/ml   Plan:  Will increase heparin infusion by 3-4 units/kg/hr to 1600 units/hr and check level in 6 hours.    Vernard Gambles, PharmD, BCPS  12/21/2021,12:52 AM

## 2021-12-22 LAB — CBC
HCT: 45.4 % (ref 39.0–52.0)
Hemoglobin: 15.2 g/dL (ref 13.0–17.0)
MCH: 26.6 pg (ref 26.0–34.0)
MCHC: 33.5 g/dL (ref 30.0–36.0)
MCV: 79.4 fL — ABNORMAL LOW (ref 80.0–100.0)
Platelets: 172 10*3/uL (ref 150–400)
RBC: 5.72 MIL/uL (ref 4.22–5.81)
RDW: 13.7 % (ref 11.5–15.5)
WBC: 6.6 10*3/uL (ref 4.0–10.5)
nRBC: 0 % (ref 0.0–0.2)

## 2021-12-22 LAB — BASIC METABOLIC PANEL
Anion gap: 8 (ref 5–15)
BUN: 10 mg/dL (ref 6–20)
CO2: 23 mmol/L (ref 22–32)
Calcium: 9.2 mg/dL (ref 8.9–10.3)
Chloride: 108 mmol/L (ref 98–111)
Creatinine, Ser: 0.86 mg/dL (ref 0.61–1.24)
GFR, Estimated: >60 mL/min (ref >60)
Glucose, Bld: 140 mg/dL — ABNORMAL HIGH (ref 70–99)
Potassium: 4.5 mmol/L (ref 3.5–5.1)
Sodium: 139 mmol/L (ref 135–145)

## 2021-12-22 LAB — HEPARIN LEVEL (UNFRACTIONATED)
Heparin Unfractionated: 0.47 IU/mL (ref 0.30–0.70)
Heparin Unfractionated: 0.34 IU/mL (ref 0.30–0.70)

## 2021-12-22 LAB — GLUCOSE, CAPILLARY
Glucose-Capillary: 175 mg/dL — ABNORMAL HIGH (ref 70–99)
Glucose-Capillary: 262 mg/dL — ABNORMAL HIGH (ref 70–99)
Glucose-Capillary: 142 mg/dL — ABNORMAL HIGH (ref 70–99)
Glucose-Capillary: 227 mg/dL — ABNORMAL HIGH (ref 70–99)

## 2021-12-22 LAB — LIPOPROTEIN A (LPA): Lipoprotein (a): <8.4 nmol/L (ref <75.0)

## 2021-12-22 NOTE — Progress Notes (Signed)
Rounding Note    Patient Name: Jonathan Riley Date of Encounter: 12/22/2021  [facility] Cardiologist: Jodelle Red, MD   Subjective   Plan for CABG Sept 5, on IV heparin. No CP or SOB.  Inpatient Medications    Scheduled Meds:  amLODipine  10 mg Oral Daily   aspirin EC  81 mg Oral Daily   atorvastatin  80 mg Oral Daily   insulin aspart  0-15 Units Subcutaneous TID WC   insulin aspart  0-5 Units Subcutaneous QHS   metoprolol succinate  25 mg Oral Daily   sodium chloride flush  3 mL Intravenous Q12H   Continuous Infusions:  sodium chloride     heparin 2,150 Units/hr (12/22/21 0008)   PRN Meds: sodium chloride, acetaminophen, melatonin, nitroGLYCERIN, ondansetron (ZOFRAN) IV, sodium chloride flush   Vital Signs    Vitals:   12/21/21 1910 12/22/21 0000 12/22/21 0400 12/22/21 0852  BP: (!) 149/88 (!) 145/66 100/75 (!) 151/58  Pulse: 67 68 85   Resp: 20 18 18    Temp: 98.4 F (36.9 C) (!) 97.4 F (36.3 C) 98 F (36.7 C)   TempSrc: Oral Oral Oral   SpO2: 97% 95% 98%   Weight:      Height:        Intake/Output Summary (Last 24 hours) at 12/22/2021 0939 Last data filed at 12/22/2021 0853 Gross per 24 hour  Intake 243 ml  Output --  Net 243 ml      12/21/2021    3:59 AM 12/20/2021   12:24 PM 12/20/2021    5:57 AM  Last 3 Weights  Weight (lbs) 245 lb 9.5 oz 246 lb 4.1 oz 245 lb  Weight (kg) 111.4 kg 111.7 kg 111.131 kg      Telemetry    SR - Personally Reviewed  ECG    No new - Personally Reviewed  Physical Exam   GEN: No acute distress.   Neck: No JVD Cardiac: RRR, no murmurs, rubs, or gallops.  Respiratory: Clear to auscultation bilaterally. GI: Soft, nontender, non-distended  MS: No edema; No deformity. Neuro:  Nonfocal  Psych: Normal affect   Labs    High Sensitivity Troponin:  No results for input(s): "TROPONINIHS" in the last 720 hours.   Chemistry Recent Labs  Lab 12/20/21 1210  CREATININE 0.81  GFRNONAA >60     Lipids No results for input(s): "CHOL", "TRIG", "HDL", "LABVLDL", "LDLCALC", "CHOLHDL" in the last 168 hours.  Hematology Recent Labs  Lab 12/20/21 1210 12/21/21 0705 12/22/21 0726  WBC 5.6 6.5 6.6  RBC 5.47 5.57 5.72  HGB 14.8 15.0 15.2  HCT 42.8 43.5 45.4  MCV 78.2* 78.1* 79.4*  MCH 27.1 26.9 26.6  MCHC 34.6 34.5 33.5  RDW 13.7 13.7 13.7  PLT 160 175 172   Thyroid No results for input(s): "TSH", "FREET4" in the last 168 hours.  BNPNo results for input(s): "BNP", "PROBNP" in the last 168 hours.  DDimer No results for input(s): "DDIMER" in the last 168 hours.   Radiology    VAS 02/21/22 DOPPLER PRE CABG  Result Date: 12/21/2021 PREOPERATIVE VASCULAR EVALUATION Patient Name:  Jonathan Riley  Date of Exam:   12/21/2021 Medical Rec #: 02/[id number]        Accession #:    [id number] Date of Birth: [birth year]       Patient Gender: M Patient Age:   61 years Exam Location:  [facility] Procedure:      VAS [facility] DOPPLER PRE  CABG Referring Phys: HARRELL LIGHTFOOT --------------------------------------------------------------------------------  Indications:      Pre-CABG. Risk Factors:     Hypertension, hyperlipidemia, Diabetes, coronary artery                   disease. Comparison Study: No prior study on file Performing Technologist: Sherren Kerns RVS  Examination Guidelines: A complete evaluation includes B-mode imaging, spectral Doppler, color Doppler, and power Doppler as needed of all accessible portions of each vessel. Bilateral testing is considered an integral part of a complete examination. Limited examinations for reoccurring indications may be performed as noted.  Right Carotid Findings: +----------+--------+--------+--------+------------+--------+           PSV cm/sEDV cm/sStenosisDescribe    Comments +----------+--------+--------+--------+------------+--------+ CCA Prox  162     17                                   +----------+--------+--------+--------+------------+--------+  CCA Distal81      17                                   +----------+--------+--------+--------+------------+--------+ ICA Prox  143     27              heterogenous         +----------+--------+--------+--------+------------+--------+ ICA Mid   147     24                                   +----------+--------+--------+--------+------------+--------+ ICA Distal85      18                                   +----------+--------+--------+--------+------------+--------+ ECA       215     19                                   +----------+--------+--------+--------+------------+--------+ +----------+--------+-------+--------+------------+           PSV cm/sEDV cmsDescribeArm Pressure +----------+--------+-------+--------+------------+ Subclavian99                                  +----------+--------+-------+--------+------------+ +---------+--------+--+--------+--+ VertebralPSV cm/s60EDV cm/s17 +---------+--------+--+--------+--+ Left Carotid Findings: +----------+--------+--------+--------+-----------+--------+           PSV cm/sEDV cm/sStenosisDescribe   Comments +----------+--------+--------+--------+-----------+--------+ CCA Prox  138     24                                  +----------+--------+--------+--------+-----------+--------+ CCA Distal96      14              homogeneous         +----------+--------+--------+--------+-----------+--------+ ICA Prox  100     28              calcific            +----------+--------+--------+--------+-----------+--------+ ICA Mid   100     29                                  +----------+--------+--------+--------+-----------+--------+  ICA Distal75      17                                  +----------+--------+--------+--------+-----------+--------+ ECA       198     10                                  +----------+--------+--------+--------+-----------+--------+   +----------+--------+--------+--------+------------+ SubclavianPSV cm/sEDV cm/sDescribeArm Pressure +----------+--------+--------+--------+------------+           249                                  +----------+--------+--------+--------+------------+ +---------+--------+--+--------+--+ VertebralPSV cm/s59EDV cm/s17 +---------+--------+--+--------+--+  ABI Findings: +---------+------------------+-----+-----------+--------+ Right    Rt Pressure (mmHg)IndexWaveform   Comment  +---------+------------------+-----+-----------+--------+ Brachial 157                    triphasic           +---------+------------------+-----+-----------+--------+ PTA      207               1.32 multiphasic         +---------+------------------+-----+-----------+--------+ DP       181               1.15 multiphasic         +---------+------------------+-----+-----------+--------+ Great Toe148               0.94                     +---------+------------------+-----+-----------+--------+ +---------+------------------+-----+-----------+-------+ Left     Lt Pressure (mmHg)IndexWaveform   Comment +---------+------------------+-----+-----------+-------+ Brachial 155                    triphasic          +---------+------------------+-----+-----------+-------+ PTA      199               1.27 multiphasic        +---------+------------------+-----+-----------+-------+ DP       193               1.23 multiphasic        +---------+------------------+-----+-----------+-------+ Great Toe120               0.76                    +---------+------------------+-----+-----------+-------+ +-------+---------------+----------------+ ABI/TBIToday's ABI/TBIPrevious ABI/TBI +-------+---------------+----------------+ Right  1.3/0.94                        +-------+---------------+----------------+ Left   1.27/0.76                        +-------+---------------+----------------+  Right Doppler Findings: +--------+--------+-----+---------+--------+ Site    PressureIndexDoppler  Comments +--------+--------+-----+---------+--------+ [id number]          triphasic         +--------+--------+-----+---------+--------+ Radial               triphasic         +--------+--------+-----+---------+--------+ Ulnar                triphasic         +--------+--------+-----+---------+--------+  Left Doppler Findings: +--------+--------+-----+---------+--------+ Site    PressureIndexDoppler  Comments +--------+--------+-----+---------+--------+  [phone]          triphasic         +--------+--------+-----+---------+--------+ Radial               triphasic         +--------+--------+-----+---------+--------+ Ulnar                triphasic         +--------+--------+-----+---------+--------+   Summary: Right Carotid: The extracranial vessels were near-normal with only minimal wall                thickening or plaque. Left Carotid: The extracranial vessels were near-normal with only minimal wall               thickening or plaque. Vertebrals:  Bilateral vertebral arteries demonstrate antegrade flow. Subclavians: Normal flow hemodynamics were seen in bilateral subclavian              arteries. Right ABI: Resting right ankle-brachial index is within normal range. The right toe-brachial index is normal. Left ABI: Resting left ankle-brachial index is within normal range. The left toe-brachial index is normal. Right Upper Extremity: Doppler waveforms remain within normal limits with right radial compression. Doppler waveforms remain within normal limits with right ulnar compression. Left Upper Extremity: Doppler waveforms remain within normal limits with left radial compression. Doppler waveforms remain within normal limits with left ulnar compression.  Electronically signed by Sherald Hess MD on 12/21/2021 at 5:30:36 PM.     Final     Cardiac Studies   Cath as above  Patient Profile     61 y.o. male with a hx of hypertension, type II diabetes, hyperlipidemia, found to have multivessel CAD, planned for CABG  Assessment & Plan    Principal Problem:   Unstable angina (HCC) Active Problems:   Coronary artery calcification seen on CT scan   CAD (coronary artery disease)   CAD (coronary artery disease), native coronary artery  CAD planning for CABG HTN HLD - continue heparin gtt -CABG planned for sept 5 - cont asa 81 mg daily - continue atorvastatin 80 mg daily - insulin SS - on ARB but will hold 2 days prior to CABG, stopped 9/3. - cont BB - cont amlodipine, dose increased for HTN. 10 mg daily. Tolerating well.  -bmet pending   For questions or updates, please contact [facility] Please consult [email or web address] for contact info under        Signed, Parke Poisson, MD  12/22/2021, 9:39 AM

## 2021-12-22 NOTE — Progress Notes (Addendum)
ANTICOAGULATION CONSULT NOTE   Pharmacy Consult for Heparin Indication: chest pain/ACS  Allergies  Allergen Reactions   Percocet [Oxycodone-Acetaminophen] Nausea And Vomiting   Crestor [Rosuvastatin]     Other reaction(s): myalgia/arthralgia (06/2020)    Patient Measurements: Height: 5\' 10"  (177.8 cm) Weight: 111.4 kg (245 lb 9.5 oz) IBW/kg (Calculated) : 73 Heparin Dosing Weight: 97.4 kg   Vital Signs: Temp: 98 F (36.7 C) (09/03 0400) Temp Source: Oral (09/03 0400) BP: 100/75 (09/03 0400) Pulse Rate: 85 (09/03 0400)  Labs: Recent Labs    12/20/21 1210 12/20/21 2252 12/21/21 0705 12/21/21 1443 12/21/21 2230 12/22/21 0726  HGB 14.8  --  15.0  --   --  15.2  HCT 42.8  --  43.5  --   --  45.4  PLT 160  --  175  --   --  172  HEPARINUNFRC  --    < > <0.10* 0.16* 0.34 0.47  CREATININE 0.81  --   --   --   --   --    < > = values in this interval not displayed.     Estimated Creatinine Clearance: 121.3 mL/min (by C-G formula based on SCr of 0.81 mg/dL).   Medical History: Past Medical History:  Diagnosis Date   Arthritis    Depression    Diabetes mellitus without complication (HCC)    Dyspnea    "with exertion"   Hypertension    Nocturia    1-2 times during night   PONV (postoperative nausea and vomiting)    "nausea after a knee surgery"    Assessment: 61 yo male presented on 12/20/21 for CAD. S/p cath 9/1 with 25-305 stenosis and possible catheter induced dissection/trauma, CVTS consulted and plan for surgery on 9/5. Pharmacy consulted to dose heparin.  Heparin level today is therapeutic at 0.47, on 2150 units/hr. Hgb 15.2, plt 172. No line issues or signs/symptoms of bleeding reported. This is the second therapeutic heparin level at this infusion rate.   Goal of Therapy:  Heparin level 0.3-0.7 units/ml Monitor platelets by anticoagulation protocol: Yes   Plan:  Continue heparin at 2150 units/hr. Monitor heparin level, CBC and s/sx of bleeding  daily.   2151, PharmD PGY1 Pharmacy Resident 9/3/20238:52 AM

## 2021-12-23 ENCOUNTER — Inpatient Hospital Stay (HOSPITAL_COMMUNITY): Payer: BC Managed Care – PPO

## 2021-12-23 DIAGNOSIS — I2584 Coronary atherosclerosis due to calcified coronary lesion: Secondary | ICD-10-CM

## 2021-12-23 DIAGNOSIS — E785 Hyperlipidemia, unspecified: Secondary | ICD-10-CM

## 2021-12-23 DIAGNOSIS — I10 Essential (primary) hypertension: Secondary | ICD-10-CM

## 2021-12-23 LAB — URINALYSIS, ROUTINE W REFLEX MICROSCOPIC
Bilirubin Urine: NEGATIVE
Glucose, UA: NEGATIVE mg/dL
Hgb urine dipstick: NEGATIVE
Ketones, ur: NEGATIVE mg/dL
Leukocytes,Ua: NEGATIVE
Nitrite: NEGATIVE
Protein, ur: NEGATIVE mg/dL
Specific Gravity, Urine: 1.011 (ref 1.005–1.030)
pH: 6 (ref 5.0–8.0)

## 2021-12-23 LAB — GLUCOSE, CAPILLARY
Glucose-Capillary: 180 mg/dL — ABNORMAL HIGH (ref 70–99)
Glucose-Capillary: 175 mg/dL — ABNORMAL HIGH (ref 70–99)
Glucose-Capillary: 188 mg/dL — ABNORMAL HIGH (ref 70–99)

## 2021-12-23 LAB — BASIC METABOLIC PANEL
Anion gap: 8 (ref 5–15)
BUN: 12 mg/dL (ref 6–20)
CO2: 24 mmol/L (ref 22–32)
Calcium: 9.3 mg/dL (ref 8.9–10.3)
Chloride: 106 mmol/L (ref 98–111)
Creatinine, Ser: 1 mg/dL (ref 0.61–1.24)
GFR, Estimated: >60 mL/min (ref >60)
Glucose, Bld: 188 mg/dL — ABNORMAL HIGH (ref 70–99)
Potassium: 3.9 mmol/L (ref 3.5–5.1)
Sodium: 138 mmol/L (ref 135–145)

## 2021-12-23 LAB — CBC
HCT: 43.4 % (ref 39.0–52.0)
Hemoglobin: 15.1 g/dL (ref 13.0–17.0)
MCH: 27.4 pg (ref 26.0–34.0)
MCHC: 34.8 g/dL (ref 30.0–36.0)
MCV: 78.6 fL — ABNORMAL LOW (ref 80.0–100.0)
Platelets: 161 10*3/uL (ref 150–400)
RBC: 5.52 MIL/uL (ref 4.22–5.81)
RDW: 13.5 % (ref 11.5–15.5)
WBC: 7.2 10*3/uL (ref 4.0–10.5)
nRBC: 0 % (ref 0.0–0.2)

## 2021-12-23 LAB — PREPARE RBC (CROSSMATCH)

## 2021-12-23 LAB — HEPARIN LEVEL (UNFRACTIONATED): Heparin Unfractionated: 0.4 IU/mL (ref 0.30–0.70)

## 2021-12-23 LAB — PROTIME-INR
INR: 1 (ref 0.8–1.2)
Prothrombin Time: 13 seconds (ref 11.4–15.2)

## 2021-12-23 LAB — APTT: aPTT: 70 seconds — ABNORMAL HIGH (ref 24–36)

## 2021-12-23 MED ORDER — TEMAZEPAM 15 MG PO CAPS
15.0000 mg | ORAL_CAPSULE | Freq: Once | ORAL | Status: AC | PRN
  Administered 2021-12-23: 15 mg via ORAL
  Filled 2021-12-23: qty 1

## 2021-12-23 MED ORDER — CHLORHEXIDINE GLUCONATE CLOTH 2 % EX PADS: 6.0000 | MEDICATED_PAD | Freq: Once | CUTANEOUS | Status: AC

## 2021-12-23 MED ORDER — CHLORHEXIDINE GLUCONATE 0.12 % MT SOLN
15.0000 mL | Freq: Once | OROMUCOSAL | Status: AC
Start: 2021-12-24 — End: 2021-12-24
  Administered 2021-12-24: 15 mL via OROMUCOSAL
  Filled 2021-12-23: qty 15

## 2021-12-23 MED ORDER — BISACODYL 5 MG PO TBEC: 5.0000 mg | DELAYED_RELEASE_TABLET | Freq: Once | ORAL | Status: DC

## 2021-12-23 MED ORDER — METOPROLOL TARTRATE 12.5 MG HALF TABLET
12.5000 mg | ORAL_TABLET | Freq: Once | ORAL | Status: AC
  Administered 2021-12-24: 12.5 mg via ORAL
  Filled 2021-12-23: qty 1

## 2021-12-23 MED ORDER — CHLORHEXIDINE GLUCONATE CLOTH 2 % EX PADS: 6.0000 | MEDICATED_PAD | Freq: Once | CUTANEOUS | Status: DC

## 2021-12-23 NOTE — Progress Notes (Signed)
  Transition of Care (TOC) Screening Note   Patient Details  Name: [name] Date of Birth: 04-02-1961   Transition of Care [facility]) CM/SW Contact:    Erin Sons, LCSW Phone Number: 12/23/2021, 12:41 PM    Transition of Care Department [facility]) has reviewed patient and no TOC needs have been identified at this time. We will continue to monitor patient advancement through interdisciplinary progression rounds. If new patient transition needs arise, please place a TOC consult.

## 2021-12-23 NOTE — Progress Notes (Signed)
Rounding Note    Patient Name: Jonathan Riley Date of Encounter: 12/23/2021  [facility] Cardiologist: Jodelle Red, MD   Subjective   CABG planned for tomorrow. No chest pain or shortness of breath. He is anxious but ready.   Inpatient Medications    Scheduled Meds:  amLODipine  10 mg Oral Daily   aspirin EC  81 mg Oral Daily   atorvastatin  80 mg Oral Daily   insulin aspart  0-15 Units Subcutaneous TID WC   insulin aspart  0-5 Units Subcutaneous QHS   metoprolol succinate  25 mg Oral Daily   sodium chloride flush  3 mL Intravenous Q12H   Continuous Infusions:  sodium chloride     heparin 2,150 Units/hr (12/23/21 0132)   PRN Meds: sodium chloride, acetaminophen, melatonin, nitroGLYCERIN, ondansetron (ZOFRAN) IV, sodium chloride flush   Vital Signs    Vitals:   12/22/21 1954 12/22/21 2351 12/23/21 0335 12/23/21 0729  BP: 135/81 (!) 140/75 131/80 128/80  Pulse: 65 60 66 78  Resp: 17 18 16 18   Temp: 98.2 F (36.8 C) 98.5 F (36.9 C) 98.9 F (37.2 C)   TempSrc: Oral Oral Oral   SpO2: 98% 96% 98% 91%  Weight:      Height:        Intake/Output Summary (Last 24 hours) at 12/23/2021 0831 Last data filed at 12/22/2021 1832 Gross per 24 hour  Intake 420 ml  Output --  Net 420 ml      12/21/2021    3:59 AM 12/20/2021   12:24 PM 12/20/2021    5:57 AM  Last 3 Weights  Weight (lbs) 245 lb 9.5 oz 246 lb 4.1 oz 245 lb  Weight (kg) 111.4 kg 111.7 kg 111.131 kg      Telemetry    SR - Personally Reviewed  ECG    No new - Personally Reviewed  Physical Exam   GEN: Well nourished, well developed in no acute distress NECK: No JVD CARDIAC: regular rhythm, normal S1 and S2, no rubs or gallops. No murmur. VASCULAR: Radial pulses 2+ bilaterally.  RESPIRATORY:  Clear to auscultation without rales, wheezing or rhonchi  ABDOMEN: Soft, non-tender, non-distended MUSCULOSKELETAL:  Moves all 4 limbs independently SKIN: Warm and dry, no edema NEUROLOGIC:   No focal neuro deficits noted. PSYCHIATRIC:  Normal affect    Labs    High Sensitivity Troponin:  No results for input(s): "TROPONINIHS" in the last 720 hours.   Chemistry Recent Labs  Lab 12/20/21 1210 12/22/21 0727 12/23/21 0229  NA  --  139 138  K  --  4.5 3.9  CL  --  108 106  CO2  --  23 24  GLUCOSE  --  140* 188*  BUN  --  10 12  CREATININE 0.81 0.86 1.00  CALCIUM  --  9.2 9.3  GFRNONAA >60 >60 >60  ANIONGAP  --  8 8    Lipids No results for input(s): "CHOL", "TRIG", "HDL", "LABVLDL", "LDLCALC", "CHOLHDL" in the last 168 hours.  Hematology Recent Labs  Lab 12/21/21 0705 12/22/21 0726 12/23/21 0229  WBC 6.5 6.6 7.2  RBC 5.57 5.72 5.52  HGB 15.0 15.2 15.1  HCT 43.5 45.4 43.4  MCV 78.1* 79.4* 78.6*  MCH 26.9 26.6 27.4  MCHC 34.5 33.5 34.8  RDW 13.7 13.7 13.5  PLT 175 172 161   Thyroid No results for input(s): "TSH", "FREET4" in the last 168 hours.  BNPNo results for input(s): "BNP", "PROBNP" in the  last 168 hours.  DDimer No results for input(s): "DDIMER" in the last 168 hours.   Radiology    VAS US DOPPLER PRE CABG  Result Date: 12/21/2021 PREOPERATIVE VASCULAR EVALUATION Patient Name:  Jonathan Riley  Date of Exam:   12/21/2021 Medical Rec #: [id number]        Accession #:    [id number] Date of Birth: 1960-05-04       Patient Gender: M Patient Age:   61 years Exam Location:  [facility] Procedure:      VAS US DOPPLER PRE CABG Referring Phys: HARRELL LIGHTFOOT --------------------------------------------------------------------------------  Indications:      Pre-CABG. Risk Factors:     Hypertension, hyperlipidemia, Diabetes, coronary artery                   disease. Comparison Study: No prior study on file Performing Technologist: Sherren Kerns RVS  Examination Guidelines: A complete evaluation includes B-mode imaging, spectral Doppler, color Doppler, and power Doppler as needed of all accessible portions of each vessel. Bilateral testing is considered an  integral part of a complete examination. Limited examinations for reoccurring indications may be performed as noted.  Right Carotid Findings: +----------+--------+--------+--------+------------+--------+           PSV cm/sEDV cm/sStenosisDescribe    Comments +----------+--------+--------+--------+------------+--------+ CCA Prox  162     17                                   +----------+--------+--------+--------+------------+--------+ CCA Distal81      17                                   +----------+--------+--------+--------+------------+--------+ ICA Prox  143     27              heterogenous         +----------+--------+--------+--------+------------+--------+ ICA Mid   147     24                                   +----------+--------+--------+--------+------------+--------+ ICA Distal85      18                                   +----------+--------+--------+--------+------------+--------+ ECA       215     19                                   +----------+--------+--------+--------+------------+--------+ +----------+--------+-------+--------+------------+           PSV cm/sEDV cmsDescribeArm Pressure +----------+--------+-------+--------+------------+ Subclavian99                                  +----------+--------+-------+--------+------------+ +---------+--------+--+--------+--+ VertebralPSV cm/s60EDV cm/s17 +---------+--------+--+--------+--+ Left Carotid Findings: +----------+--------+--------+--------+-----------+--------+           PSV cm/sEDV cm/sStenosisDescribe   Comments +----------+--------+--------+--------+-----------+--------+ CCA Prox  138     24                                  +----------+--------+--------+--------+-----------+--------+ CCA  Distal96      14              homogeneous         +----------+--------+--------+--------+-----------+--------+ ICA Prox  100     28              calcific             +----------+--------+--------+--------+-----------+--------+ ICA Mid   100     29                                  +----------+--------+--------+--------+-----------+--------+ ICA Distal75      17                                  +----------+--------+--------+--------+-----------+--------+ ECA       198     10                                  +----------+--------+--------+--------+-----------+--------+  +----------+--------+--------+--------+------------+ SubclavianPSV cm/sEDV cm/sDescribeArm Pressure +----------+--------+--------+--------+------------+           249                                  +----------+--------+--------+--------+------------+ +---------+--------+--+--------+--+ VertebralPSV cm/s59EDV cm/s17 +---------+--------+--+--------+--+  ABI Findings: +---------+------------------+-----+-----------+--------+ Right    Rt Pressure (mmHg)IndexWaveform   Comment  +---------+------------------+-----+-----------+--------+ Brachial 157                    triphasic           +---------+------------------+-----+-----------+--------+ PTA      207               1.32 multiphasic         +---------+------------------+-----+-----------+--------+ DP       181               1.15 multiphasic         +---------+------------------+-----+-----------+--------+ Great Toe148               0.94                     +---------+------------------+-----+-----------+--------+ +---------+------------------+-----+-----------+-------+ Left     Lt Pressure (mmHg)IndexWaveform   Comment +---------+------------------+-----+-----------+-------+ Brachial 155                    triphasic          +---------+------------------+-----+-----------+-------+ PTA      199               1.27 multiphasic        +---------+------------------+-----+-----------+-------+ DP       193               1.23 multiphasic         +---------+------------------+-----+-----------+-------+ Great Toe120               0.76                    +---------+------------------+-----+-----------+-------+ +-------+---------------+----------------+ ABI/TBIToday's ABI/TBIPrevious ABI/TBI +-------+---------------+----------------+ Right  1.3/0.94                        +-------+---------------+----------------+ Left   1.27/0.76                       +-------+---------------+----------------+  Right Doppler Findings: +--------+--------+-----+---------+--------+ Site    PressureIndexDoppler  Comments +--------+--------+-----+---------+--------+ [id number]          triphasic         +--------+--------+-----+---------+--------+ Radial               triphasic         +--------+--------+-----+---------+--------+ Ulnar                triphasic         +--------+--------+-----+---------+--------+  Left Doppler Findings: +--------+--------+-----+---------+--------+ Site    PressureIndexDoppler  Comments +--------+--------+-----+---------+--------+ [id number]          triphasic         +--------+--------+-----+---------+--------+ Radial               triphasic         +--------+--------+-----+---------+--------+ Ulnar                triphasic         +--------+--------+-----+---------+--------+   Summary: Right Carotid: The extracranial vessels were near-normal with only minimal wall                thickening or plaque. Left Carotid: The extracranial vessels were near-normal with only minimal wall               thickening or plaque. Vertebrals:  Bilateral vertebral arteries demonstrate antegrade flow. Subclavians: Normal flow hemodynamics were seen in bilateral subclavian              arteries. Right ABI: Resting right ankle-brachial index is within normal range. The right toe-brachial index is normal. Left ABI: Resting left ankle-brachial index is within normal range. The left toe-brachial index is  normal. Right Upper Extremity: Doppler waveforms remain within normal limits with right radial compression. Doppler waveforms remain within normal limits with right ulnar compression. Left Upper Extremity: Doppler waveforms remain within normal limits with left radial compression. Doppler waveforms remain within normal limits with left ulnar compression.  Electronically signed by Sherald Hess MD on 12/21/2021 at 5:30:36 PM.    Final     Cardiac Studies   Cath as above  Patient Profile     61 y.o. male with a hx of hypertension, type II diabetes, hyperlipidemia, found to have multivessel CAD, planned for CABG  Assessment & Plan    Principal Problem:   Unstable angina (HCC) Active Problems:   Coronary artery calcification seen on CT scan   CAD (coronary artery disease)   CAD (coronary artery disease), native coronary artery  CAD planning for CABG HTN HLD - continue heparin gtt -CABG planned for sept 5 - cont asa 81 mg daily - continue atorvastatin 80 mg daily - insulin SS - on ARB but will hold 2 days prior to CABG, stopped 9/3. - cont BB - cont amlodipine, dose increased for HTN. 10 mg daily. Tolerating well.   For questions or updates, please contact [facility] Please consult [email or web address] for contact info under        Signed, Jodelle Red, MD  12/23/2021, 8:31 AM

## 2021-12-23 NOTE — Progress Notes (Addendum)
ANTICOAGULATION CONSULT NOTE   Pharmacy Consult for Heparin Indication: chest pain/ACS  Allergies  Allergen Reactions   Percocet [Oxycodone-Acetaminophen] Nausea And Vomiting   Crestor [Rosuvastatin]     Other reaction(s): myalgia/arthralgia (06/2020)    Patient Measurements: Height: 5\' 10"  (177.8 cm) Weight: 111.4 kg (245 lb 9.5 oz) IBW/kg (Calculated) : 73 Heparin Dosing Weight: 97.4 kg   Vital Signs: Temp: 98.9 F (37.2 C) (09/04 0335) Temp Source: Oral (09/04 0335) BP: 128/80 (09/04 0729) Pulse Rate: 78 (09/04 0729)  Labs: Recent Labs    12/20/21 1210 12/20/21 2252 12/21/21 0705 12/21/21 1443 12/22/21 0726 12/22/21 0727 12/22/21 1531 12/23/21 0229  HGB 14.8  --  15.0  --  15.2  --   --  15.1  HCT 42.8  --  43.5  --  45.4  --   --  43.4  PLT 160  --  175  --  172  --   --  161  HEPARINUNFRC  --    < > <0.10*   < > 0.47  --  0.34 0.40  CREATININE 0.81  --   --   --   --  0.86  --  1.00   < > = values in this interval not displayed.     Estimated Creatinine Clearance: 98.2 mL/min (by C-G formula based on SCr of 1 mg/dL).   Medical History: Past Medical History:  Diagnosis Date   Arthritis    Depression    Diabetes mellitus without complication (HCC)    Dyspnea    "with exertion"   Hypertension    Nocturia    1-2 times during night   PONV (postoperative nausea and vomiting)    "nausea after a knee surgery"    Assessment: 61 yo male presented on 12/20/21 for CAD. S/p cath 9/1 with 25-305 stenosis and possible catheter induced dissection/trauma, CVTS consulted and plan for surgery on 9/5. Pharmacy consulted to dose heparin.  Heparin level is therapeutic at 0.40 on 2150 units/hr. Hgb 15.1, plt 161K stable. No line issues or signs/symptoms of bleeding per RN.   Goal of Therapy:  Heparin level 0.3-0.7 units/ml Monitor platelets by anticoagulation protocol: Yes   Plan:  Continue heparin at 2150 units/hr. Monitor heparin level, CBC and s/sx of bleeding  daily. F/u CABG, planned for 9/5  11/5, PharmD PGY1 Pharmacy Resident   12/23/2021  9:06 AM

## 2021-12-23 NOTE — Progress Notes (Signed)
Mobility Specialist Progress Note:   12/23/21 1223  Mobility  Activity Ambulated with assistance in hallway  Level of Assistance Independent  Assistive Device None  Distance Ambulated (ft) 550 ft  Activity Response Tolerated well  $Mobility charge 1 Mobility   Pt received EOB willing to participate in mobility. No complaints of pain. Left EOB with call bell in reach and all needs met.   [facility] Brisha Mccabe Mobility Specialist

## 2021-12-24 ENCOUNTER — Other Ambulatory Visit: Payer: Self-pay

## 2021-12-24 ENCOUNTER — Inpatient Hospital Stay (HOSPITAL_COMMUNITY): Payer: BC Managed Care – PPO | Admitting: Critical Care Medicine

## 2021-12-24 ENCOUNTER — Inpatient Hospital Stay (HOSPITAL_COMMUNITY): Payer: BC Managed Care – PPO

## 2021-12-24 ENCOUNTER — Inpatient Hospital Stay (HOSPITAL_COMMUNITY)
Admission: RE | Disposition: A | Discharge status: Home / Self Care | Payer: BC Managed Care – PPO | Source: Home / Self Care | Attending: Thoracic Surgery (Cardiothoracic Vascular Surgery)

## 2021-12-24 ENCOUNTER — Encounter (HOSPITAL_COMMUNITY): Payer: Self-pay | Admitting: Interventional Cardiology

## 2021-12-24 DIAGNOSIS — I200 Unstable angina: Secondary | ICD-10-CM | POA: Diagnosis not present

## 2021-12-24 DIAGNOSIS — I2510 Atherosclerotic heart disease of native coronary artery without angina pectoris: Secondary | ICD-10-CM

## 2021-12-24 DIAGNOSIS — Z9911 Dependence on respirator [ventilator] status: Secondary | ICD-10-CM | POA: Diagnosis not present

## 2021-12-24 DIAGNOSIS — E119 Type 2 diabetes mellitus without complications: Secondary | ICD-10-CM

## 2021-12-24 DIAGNOSIS — I25110 Atherosclerotic heart disease of native coronary artery with unstable angina pectoris: Principal | ICD-10-CM

## 2021-12-24 HISTORY — PX: TEE WITHOUT CARDIOVERSION: SHX5443

## 2021-12-24 HISTORY — PX: CORONARY ARTERY BYPASS GRAFT: SHX141

## 2021-12-24 LAB — BASIC METABOLIC PANEL
Anion gap: 13 (ref 5–15)
BUN: 11 mg/dL (ref 6–20)
CO2: 21 mmol/L — ABNORMAL LOW (ref 22–32)
Calcium: 9.5 mg/dL (ref 8.9–10.3)
Chloride: 105 mmol/L (ref 98–111)
Creatinine, Ser: 0.89 mg/dL (ref 0.61–1.24)
GFR, Estimated: >60 mL/min (ref >60)
Glucose, Bld: 166 mg/dL — ABNORMAL HIGH (ref 70–99)
Potassium: 4 mmol/L (ref 3.5–5.1)
Sodium: 139 mmol/L (ref 135–145)

## 2021-12-24 LAB — POCT I-STAT, CHEM 8
BUN: 12 mg/dL (ref 6–20)
BUN: 13 mg/dL (ref 6–20)
BUN: 14 mg/dL (ref 6–20)
BUN: 15 mg/dL (ref 6–20)
BUN: 15 mg/dL (ref 6–20)
Calcium, Ion: 1.26 mmol/L (ref 1.15–1.40)
Calcium, Ion: 1.23 mmol/L (ref 1.15–1.40)
Calcium, Ion: 1.1 mmol/L — ABNORMAL LOW (ref 1.15–1.40)
Calcium, Ion: 1.07 mmol/L — ABNORMAL LOW (ref 1.15–1.40)
Calcium, Ion: 1.33 mmol/L (ref 1.15–1.40)
Chloride: 104 mmol/L (ref 98–111)
Chloride: 104 mmol/L (ref 98–111)
Chloride: 104 mmol/L (ref 98–111)
Chloride: 103 mmol/L (ref 98–111)
Chloride: 105 mmol/L (ref 98–111)
Creatinine, Ser: 0.7 mg/dL (ref 0.61–1.24)
Creatinine, Ser: 0.8 mg/dL (ref 0.61–1.24)
Creatinine, Ser: 0.8 mg/dL (ref 0.61–1.24)
Creatinine, Ser: 0.8 mg/dL (ref 0.61–1.24)
Creatinine, Ser: 0.8 mg/dL (ref 0.61–1.24)
Glucose, Bld: 108 mg/dL — ABNORMAL HIGH (ref 70–99)
Glucose, Bld: 111 mg/dL — ABNORMAL HIGH (ref 70–99)
Glucose, Bld: 117 mg/dL — ABNORMAL HIGH (ref 70–99)
Glucose, Bld: 153 mg/dL — ABNORMAL HIGH (ref 70–99)
Glucose, Bld: 151 mg/dL — ABNORMAL HIGH (ref 70–99)
HCT: 39 % (ref 39.0–52.0)
HCT: 40 % (ref 39.0–52.0)
HCT: 34 % — ABNORMAL LOW (ref 39.0–52.0)
HCT: 33 % — ABNORMAL LOW (ref 39.0–52.0)
HCT: 34 % — ABNORMAL LOW (ref 39.0–52.0)
Hemoglobin: 13.3 g/dL (ref 13.0–17.0)
Hemoglobin: 13.6 g/dL (ref 13.0–17.0)
Hemoglobin: 11.6 g/dL — ABNORMAL LOW (ref 13.0–17.0)
Hemoglobin: 11.2 g/dL — ABNORMAL LOW (ref 13.0–17.0)
Hemoglobin: 11.6 g/dL — ABNORMAL LOW (ref 13.0–17.0)
Potassium: 4 mmol/L (ref 3.5–5.1)
Potassium: 4.5 mmol/L (ref 3.5–5.1)
Potassium: 5.3 mmol/L — ABNORMAL HIGH (ref 3.5–5.1)
Potassium: 5.8 mmol/L — ABNORMAL HIGH (ref 3.5–5.1)
Potassium: 5.2 mmol/L — ABNORMAL HIGH (ref 3.5–5.1)
Sodium: 139 mmol/L (ref 135–145)
Sodium: 137 mmol/L (ref 135–145)
Sodium: 136 mmol/L (ref 135–145)
Sodium: 134 mmol/L — ABNORMAL LOW (ref 135–145)
Sodium: 136 mmol/L (ref 135–145)
TCO2: 27 mmol/L (ref 22–32)
TCO2: 24 mmol/L (ref 22–32)
TCO2: 26 mmol/L (ref 22–32)
TCO2: 23 mmol/L (ref 22–32)
TCO2: 22 mmol/L (ref 22–32)

## 2021-12-24 LAB — POCT I-STAT 7, (LYTES, BLD GAS, ICA,H+H)
Acid-base deficit: 1 mmol/L (ref 0.0–2.0)
Acid-base deficit: 2 mmol/L (ref 0.0–2.0)
Acid-base deficit: 2 mmol/L (ref 0.0–2.0)
Acid-base deficit: 4 mmol/L — ABNORMAL HIGH (ref 0.0–2.0)
Acid-base deficit: 3 mmol/L — ABNORMAL HIGH (ref 0.0–2.0)
Acid-base deficit: 5 mmol/L — ABNORMAL HIGH (ref 0.0–2.0)
Acid-base deficit: 7 mmol/L — ABNORMAL HIGH (ref 0.0–2.0)
Bicarbonate: 24.6 mmol/L (ref 20.0–28.0)
Bicarbonate: 25.4 mmol/L (ref 20.0–28.0)
Bicarbonate: 21.5 mmol/L (ref 20.0–28.0)
Bicarbonate: 24 mmol/L (ref 20.0–28.0)
Bicarbonate: 22 mmol/L (ref 20.0–28.0)
Bicarbonate: 23.3 mmol/L (ref 20.0–28.0)
Bicarbonate: 19.5 mmol/L — ABNORMAL LOW (ref 20.0–28.0)
Calcium, Ion: 1.03 mmol/L — ABNORMAL LOW (ref 1.15–1.40)
Calcium, Ion: 1.11 mmol/L — ABNORMAL LOW (ref 1.15–1.40)
Calcium, Ion: 1.07 mmol/L — ABNORMAL LOW (ref 1.15–1.40)
Calcium, Ion: 1.08 mmol/L — ABNORMAL LOW (ref 1.15–1.40)
Calcium, Ion: 1.05 mmol/L — ABNORMAL LOW (ref 1.15–1.40)
Calcium, Ion: 1.33 mmol/L (ref 1.15–1.40)
Calcium, Ion: 1.23 mmol/L (ref 1.15–1.40)
HCT: 33 % — ABNORMAL LOW (ref 39.0–52.0)
HCT: 33 % — ABNORMAL LOW (ref 39.0–52.0)
HCT: 32 % — ABNORMAL LOW (ref 39.0–52.0)
HCT: 33 % — ABNORMAL LOW (ref 39.0–52.0)
HCT: 31 % — ABNORMAL LOW (ref 39.0–52.0)
HCT: 32 % — ABNORMAL LOW (ref 39.0–52.0)
HCT: 32 % — ABNORMAL LOW (ref 39.0–52.0)
Hemoglobin: 11.2 g/dL — ABNORMAL LOW (ref 13.0–17.0)
Hemoglobin: 11.2 g/dL — ABNORMAL LOW (ref 13.0–17.0)
Hemoglobin: 10.9 g/dL — ABNORMAL LOW (ref 13.0–17.0)
Hemoglobin: 11.2 g/dL — ABNORMAL LOW (ref 13.0–17.0)
Hemoglobin: 10.5 g/dL — ABNORMAL LOW (ref 13.0–17.0)
Hemoglobin: 10.9 g/dL — ABNORMAL LOW (ref 13.0–17.0)
Hemoglobin: 10.9 g/dL — ABNORMAL LOW (ref 13.0–17.0)
O2 Saturation: 100 %
O2 Saturation: 100 %
O2 Saturation: 100 %
O2 Saturation: 100 %
O2 Saturation: 100 %
O2 Saturation: 91 %
O2 Saturation: 99 %
Patient temperature: 37.1
Potassium: 4.9 mmol/L (ref 3.5–5.1)
Potassium: 5.4 mmol/L — ABNORMAL HIGH (ref 3.5–5.1)
Potassium: 5.3 mmol/L — ABNORMAL HIGH (ref 3.5–5.1)
Potassium: 5.8 mmol/L — ABNORMAL HIGH (ref 3.5–5.1)
Potassium: 5.2 mmol/L — ABNORMAL HIGH (ref 3.5–5.1)
Potassium: 5.9 mmol/L — ABNORMAL HIGH (ref 3.5–5.1)
Potassium: 4 mmol/L (ref 3.5–5.1)
Sodium: 136 mmol/L (ref 135–145)
Sodium: 137 mmol/L (ref 135–145)
Sodium: 133 mmol/L — ABNORMAL LOW (ref 135–145)
Sodium: 134 mmol/L — ABNORMAL LOW (ref 135–145)
Sodium: 133 mmol/L — ABNORMAL LOW (ref 135–145)
Sodium: 136 mmol/L (ref 135–145)
Sodium: 138 mmol/L (ref 135–145)
TCO2: 26 mmol/L (ref 22–32)
TCO2: 27 mmol/L (ref 22–32)
TCO2: 23 mmol/L (ref 22–32)
TCO2: 25 mmol/L (ref 22–32)
TCO2: 23 mmol/L (ref 22–32)
TCO2: 25 mmol/L (ref 22–32)
TCO2: 21 mmol/L — ABNORMAL LOW (ref 22–32)
pCO2 arterial: 48.2 mmHg — ABNORMAL HIGH (ref 32–48)
pCO2 arterial: 51.4 mmHg — ABNORMAL HIGH (ref 32–48)
pCO2 arterial: 40.4 mmHg (ref 32–48)
pCO2 arterial: 46 mmHg (ref 32–48)
pCO2 arterial: 44.5 mmHg (ref 32–48)
pCO2 arterial: 49.4 mmHg — ABNORMAL HIGH (ref 32–48)
pCO2 arterial: 42.3 mmHg (ref 32–48)
pH, Arterial: 7.301 — ABNORMAL LOW (ref 7.35–7.45)
pH, Arterial: 7.316 — ABNORMAL LOW (ref 7.35–7.45)
pH, Arterial: 7.326 — ABNORMAL LOW (ref 7.35–7.45)
pH, Arterial: 7.334 — ABNORMAL LOW (ref 7.35–7.45)
pH, Arterial: 7.257 — ABNORMAL LOW (ref 7.35–7.45)
pH, Arterial: 7.328 — ABNORMAL LOW (ref 7.35–7.45)
pH, Arterial: 7.273 — ABNORMAL LOW (ref 7.35–7.45)
pO2, Arterial: 263 mmHg — ABNORMAL HIGH (ref 83–108)
pO2, Arterial: 326 mmHg — ABNORMAL HIGH (ref 83–108)
pO2, Arterial: 285 mmHg — ABNORMAL HIGH (ref 83–108)
pO2, Arterial: 331 mmHg — ABNORMAL HIGH (ref 83–108)
pO2, Arterial: 328 mmHg — ABNORMAL HIGH (ref 83–108)
pO2, Arterial: 72 mmHg — ABNORMAL LOW (ref 83–108)
pO2, Arterial: 160 mmHg — ABNORMAL HIGH (ref 83–108)

## 2021-12-24 LAB — CBC
HCT: 44.1 % (ref 39.0–52.0)
HCT: 38.7 % — ABNORMAL LOW (ref 39.0–52.0)
Hemoglobin: 15.4 g/dL (ref 13.0–17.0)
Hemoglobin: 12.8 g/dL — ABNORMAL LOW (ref 13.0–17.0)
MCH: 27.3 pg (ref 26.0–34.0)
MCH: 26.8 pg (ref 26.0–34.0)
MCHC: 34.9 g/dL (ref 30.0–36.0)
MCHC: 33.1 g/dL (ref 30.0–36.0)
MCV: 78.1 fL — ABNORMAL LOW (ref 80.0–100.0)
MCV: 81.1 fL (ref 80.0–100.0)
Platelets: 152 10*3/uL (ref 150–400)
Platelets: 134 10*3/uL — ABNORMAL LOW (ref 150–400)
RBC: 5.65 MIL/uL (ref 4.22–5.81)
RBC: 4.77 MIL/uL (ref 4.22–5.81)
RDW: 13.5 % (ref 11.5–15.5)
RDW: 13.6 % (ref 11.5–15.5)
WBC: 7.5 10*3/uL (ref 4.0–10.5)
WBC: 12.5 10*3/uL — ABNORMAL HIGH (ref 4.0–10.5)
nRBC: 0 % (ref 0.0–0.2)
nRBC: 0 % (ref 0.0–0.2)

## 2021-12-24 LAB — POCT I-STAT EG7
Acid-base deficit: 1 mmol/L (ref 0.0–2.0)
Bicarbonate: 26.2 mmol/L (ref 20.0–28.0)
Calcium, Ion: 1.11 mmol/L — ABNORMAL LOW (ref 1.15–1.40)
HCT: 34 % — ABNORMAL LOW (ref 39.0–52.0)
Hemoglobin: 11.6 g/dL — ABNORMAL LOW (ref 13.0–17.0)
O2 Saturation: 71 %
Potassium: 4.5 mmol/L (ref 3.5–5.1)
Sodium: 138 mmol/L (ref 135–145)
TCO2: 28 mmol/L (ref 22–32)
pCO2, Ven: 56.4 mmHg (ref 44–60)
pH, Ven: 7.275 (ref 7.25–7.43)
pO2, Ven: 43 mmHg (ref 32–45)

## 2021-12-24 LAB — GLUCOSE, CAPILLARY
Glucose-Capillary: 164 mg/dL — ABNORMAL HIGH (ref 70–99)
Glucose-Capillary: 139 mg/dL — ABNORMAL HIGH (ref 70–99)
Glucose-Capillary: 112 mg/dL — ABNORMAL HIGH (ref 70–99)
Glucose-Capillary: 151 mg/dL — ABNORMAL HIGH (ref 70–99)
Glucose-Capillary: 143 mg/dL — ABNORMAL HIGH (ref 70–99)
Glucose-Capillary: 133 mg/dL — ABNORMAL HIGH (ref 70–99)
Glucose-Capillary: 150 mg/dL — ABNORMAL HIGH (ref 70–99)

## 2021-12-24 LAB — PROTIME-INR
INR: 1.4 — ABNORMAL HIGH (ref 0.8–1.2)
Prothrombin Time: 16.7 seconds — ABNORMAL HIGH (ref 11.4–15.2)

## 2021-12-24 LAB — SURGICAL PCR SCREEN
MRSA, PCR: NEGATIVE
Staphylococcus aureus: POSITIVE — AB

## 2021-12-24 LAB — HEMOGLOBIN AND HEMATOCRIT, BLOOD
HCT: 35.4 % — ABNORMAL LOW (ref 39.0–52.0)
Hemoglobin: 11.9 g/dL — ABNORMAL LOW (ref 13.0–17.0)

## 2021-12-24 LAB — PLATELET COUNT: Platelets: 185 10*3/uL (ref 150–400)

## 2021-12-24 LAB — APTT: aPTT: 32 seconds (ref 24–36)

## 2021-12-24 LAB — SARS CORONAVIRUS 2 BY RT PCR: SARS Coronavirus 2 by RT PCR: NEGATIVE

## 2021-12-24 LAB — HEPARIN LEVEL (UNFRACTIONATED): Heparin Unfractionated: 0.26 IU/mL — ABNORMAL LOW (ref 0.30–0.70)

## 2021-12-24 SURGERY — CORONARY ARTERY BYPASS GRAFTING (CABG)
Anesthesia: General | Site: Esophagus

## 2021-12-24 MED ORDER — LACTATED RINGERS IV SOLN: INTRAVENOUS | Status: DC | PRN

## 2021-12-24 MED ORDER — BISACODYL 5 MG PO TBEC
10.0000 mg | DELAYED_RELEASE_TABLET | Freq: Every day | ORAL | Status: DC
  Administered 2021-12-25 – 2021-12-28 (×4): 10 mg via ORAL
  Filled 2021-12-24 (×4): qty 2

## 2021-12-24 MED ORDER — SODIUM CHLORIDE 0.9% FLUSH
3.0000 mL | Freq: Two times a day (BID) | INTRAVENOUS | Status: DC
  Administered 2021-12-25 – 2021-12-28 (×5): 3 mL via INTRAVENOUS

## 2021-12-24 MED ORDER — BISACODYL 10 MG RE SUPP: 10.0000 mg | Freq: Every day | RECTAL | Status: DC

## 2021-12-24 MED ORDER — CEFAZOLIN SODIUM-DEXTROSE 2-4 GM/100ML-% IV SOLN
2.0000 g | Freq: Three times a day (TID) | INTRAVENOUS | Status: DC
  Administered 2021-12-24 – 2021-12-25 (×3): 2 g via INTRAVENOUS
  Filled 2021-12-24 (×3): qty 100

## 2021-12-24 MED ORDER — ASPIRIN 325 MG PO TBEC
325.0000 mg | DELAYED_RELEASE_TABLET | Freq: Every day | ORAL | Status: DC
  Administered 2021-12-25 – 2021-12-30 (×6): 325 mg via ORAL
  Filled 2021-12-24 (×6): qty 1

## 2021-12-24 MED ORDER — ACETAMINOPHEN 160 MG/5ML PO SOLN
1000.0000 mg | Freq: Four times a day (QID) | ORAL | Status: AC
  Administered 2021-12-25: 1000 mg
  Filled 2021-12-24 (×2): qty 40.6

## 2021-12-24 MED ORDER — LACTATED RINGERS IV SOLN: INTRAVENOUS | Status: DC

## 2021-12-24 MED ORDER — ACETAMINOPHEN 500 MG PO TABS
1000.0000 mg | ORAL_TABLET | Freq: Four times a day (QID) | ORAL | Status: AC
  Administered 2021-12-25 – 2021-12-29 (×18): 1000 mg via ORAL
  Filled 2021-12-24 (×18): qty 2

## 2021-12-24 MED ORDER — MUPIROCIN 2 % EX OINT
1.0000 | TOPICAL_OINTMENT | Freq: Two times a day (BID) | CUTANEOUS | Status: AC
  Administered 2021-12-24 – 2021-12-28 (×9): 1 via NASAL
  Filled 2021-12-24 (×3): qty 22

## 2021-12-24 MED ORDER — ASPIRIN 81 MG PO CHEW
324.0000 mg | CHEWABLE_TABLET | Freq: Every day | ORAL | Status: DC
  Filled 2021-12-24: qty 4

## 2021-12-24 MED ORDER — FENTANYL CITRATE (PF) 250 MCG/5ML IJ SOLN
INTRAMUSCULAR | Status: AC
  Filled 2021-12-24: qty 5

## 2021-12-24 MED ORDER — TRANEXAMIC ACID (OHS) BOLUS VIA INFUSION
15.0000 mg/kg | INTRAVENOUS | Status: AC
  Administered 2021-12-24: 1660.5 mg via INTRAVENOUS
  Filled 2021-12-24: qty 1661

## 2021-12-24 MED ORDER — NICARDIPINE HCL IN NACL 20-0.86 MG/200ML-% IV SOLN
3.0000 mg/h | INTRAVENOUS | Status: DC
  Administered 2021-12-24 – 2021-12-25 (×3): 3 mg/h via INTRAVENOUS
  Filled 2021-12-24 (×3): qty 200

## 2021-12-24 MED ORDER — MAGNESIUM SULFATE 4 GM/100ML IV SOLN
4.0000 g | Freq: Once | INTRAVENOUS | Status: AC
  Administered 2021-12-24: 4 g via INTRAVENOUS
  Filled 2021-12-24: qty 100

## 2021-12-24 MED ORDER — SODIUM CHLORIDE 0.9 % IV SOLN: INTRAVENOUS | Status: DC

## 2021-12-24 MED ORDER — PROTAMINE SULFATE 10 MG/ML IV SOLN
INTRAVENOUS | Status: AC
  Filled 2021-12-24: qty 5

## 2021-12-24 MED ORDER — PANTOPRAZOLE SODIUM 40 MG PO TBEC
40.0000 mg | DELAYED_RELEASE_TABLET | Freq: Every day | ORAL | Status: DC
  Administered 2021-12-26 – 2021-12-30 (×5): 40 mg via ORAL
  Filled 2021-12-24 (×5): qty 1

## 2021-12-24 MED ORDER — MELATONIN 5 MG PO TABS
5.0000 mg | ORAL_TABLET | Freq: Every evening | ORAL | Status: DC | PRN
  Administered 2021-12-28 – 2021-12-29 (×2): 5 mg via ORAL
  Filled 2021-12-24 (×2): qty 1

## 2021-12-24 MED ORDER — PROTAMINE SULFATE 10 MG/ML IV SOLN
INTRAVENOUS | Status: DC | PRN
  Administered 2021-12-24: 270 mg via INTRAVENOUS
  Administered 2021-12-24: 30 mg via INTRAVENOUS

## 2021-12-24 MED ORDER — CHLORHEXIDINE GLUCONATE 0.12 % MT SOLN: 15.0000 mL | Freq: Once | OROMUCOSAL | Status: DC

## 2021-12-24 MED ORDER — VANCOMYCIN HCL IN DEXTROSE 1-5 GM/200ML-% IV SOLN
1000.0000 mg | Freq: Once | INTRAVENOUS | Status: AC
  Administered 2021-12-25: 1000 mg via INTRAVENOUS
  Filled 2021-12-24: qty 200

## 2021-12-24 MED ORDER — INSULIN REGULAR(HUMAN) IN NACL 100-0.9 UT/100ML-% IV SOLN
INTRAVENOUS | Status: DC | PRN
  Administered 2021-12-24: 2.2 [IU]/h via INTRAVENOUS

## 2021-12-24 MED ORDER — SODIUM CHLORIDE 0.45 % IV SOLN: INTRAVENOUS | Status: DC | PRN

## 2021-12-24 MED ORDER — SODIUM CHLORIDE 0.9 % IV SOLN: INTRAVENOUS | Status: DC | PRN

## 2021-12-24 MED ORDER — MIDAZOLAM HCL 2 MG/2ML IJ SOLN
2.0000 mg | INTRAMUSCULAR | Status: DC | PRN
  Administered 2021-12-24 (×3): 2 mg via INTRAVENOUS
  Filled 2021-12-24 (×3): qty 2

## 2021-12-24 MED ORDER — AMIODARONE HCL IN DEXTROSE 360-4.14 MG/200ML-% IV SOLN
INTRAVENOUS | Status: DC | PRN
  Administered 2021-12-24: 30 mg/h via INTRAVENOUS

## 2021-12-24 MED ORDER — NITROGLYCERIN 0.2 MG/ML ON CALL CATH LAB
INTRAVENOUS | Status: DC | PRN
  Administered 2021-12-24 (×2): 20 ug via INTRAVENOUS

## 2021-12-24 MED ORDER — METOPROLOL TARTRATE 5 MG/5ML IV SOLN: 2.5000 mg | INTRAVENOUS | Status: DC | PRN

## 2021-12-24 MED ORDER — TRAMADOL HCL 50 MG PO TABS
50.0000 mg | ORAL_TABLET | ORAL | Status: DC | PRN
  Administered 2021-12-25 – 2021-12-26 (×6): 100 mg via ORAL
  Administered 2021-12-27 (×2): 50 mg via ORAL
  Administered 2021-12-27 – 2021-12-28 (×5): 100 mg via ORAL
  Administered 2021-12-28: 50 mg via ORAL
  Administered 2021-12-29 – 2021-12-30 (×5): 100 mg via ORAL
  Filled 2021-12-24 (×8): qty 2
  Filled 2021-12-24: qty 1
  Filled 2021-12-24 (×7): qty 2
  Filled 2021-12-24: qty 1
  Filled 2021-12-24: qty 2
  Filled 2021-12-24: qty 1

## 2021-12-24 MED ORDER — NITROGLYCERIN IN D5W 200-5 MCG/ML-% IV SOLN: 0.0000 ug/min | INTRAVENOUS | Status: DC

## 2021-12-24 MED ORDER — DEXMEDETOMIDINE HCL IN NACL 400 MCG/100ML IV SOLN
0.0000 ug/kg/h | INTRAVENOUS | Status: DC
  Administered 2021-12-24: 1.2 ug/kg/h via INTRAVENOUS
  Administered 2021-12-25: 0.2 ug/kg/h via INTRAVENOUS
  Filled 2021-12-24 (×3): qty 100

## 2021-12-24 MED ORDER — MIDAZOLAM HCL (PF) 5 MG/ML IJ SOLN
INTRAMUSCULAR | Status: DC | PRN
  Administered 2021-12-24 (×6): 1 mg via INTRAVENOUS
  Administered 2021-12-24: 3 mg via INTRAVENOUS
  Administered 2021-12-24: 1 mg via INTRAVENOUS

## 2021-12-24 MED ORDER — ONDANSETRON HCL 4 MG/2ML IJ SOLN
4.0000 mg | Freq: Four times a day (QID) | INTRAMUSCULAR | Status: DC | PRN
Start: 2021-12-24 — End: 2021-12-30
  Administered 2021-12-25 – 2021-12-30 (×5): 4 mg via INTRAVENOUS
  Filled 2021-12-24 (×6): qty 2

## 2021-12-24 MED ORDER — CHLORHEXIDINE GLUCONATE 0.12 % MT SOLN
OROMUCOSAL | Status: AC
  Filled 2021-12-24: qty 15

## 2021-12-24 MED ORDER — LACTATED RINGERS IV SOLN
INTRAVENOUS | Status: DC
  Administered 2021-12-24: 20 mL/h via INTRAVENOUS

## 2021-12-24 MED ORDER — SODIUM CHLORIDE 0.9% FLUSH: 3.0000 mL | INTRAVENOUS | Status: DC | PRN

## 2021-12-24 MED ORDER — CEFAZOLIN SODIUM-DEXTROSE 2-3 GM-%(50ML) IV SOLR
INTRAVENOUS | Status: DC | PRN
  Administered 2021-12-24 (×2): 2 g via INTRAVENOUS

## 2021-12-24 MED ORDER — CHLORHEXIDINE GLUCONATE CLOTH 2 % EX PADS
6.0000 | MEDICATED_PAD | Freq: Every day | CUTANEOUS | Status: DC
  Administered 2021-12-24: 6 via TOPICAL

## 2021-12-24 MED ORDER — HEPARIN SODIUM (PORCINE) 1000 UNIT/ML IJ SOLN
INTRAMUSCULAR | Status: AC
  Filled 2021-12-24: qty 1

## 2021-12-24 MED ORDER — HEPARIN SODIUM (PORCINE) 1000 UNIT/ML IJ SOLN
INTRAMUSCULAR | Status: DC | PRN
  Administered 2021-12-24: 35000 [IU] via INTRAVENOUS
  Administered 2021-12-24: 5000 [IU] via INTRAVENOUS

## 2021-12-24 MED ORDER — ACETAMINOPHEN 650 MG RE SUPP
650.0000 mg | Freq: Once | RECTAL | Status: AC
  Administered 2021-12-24: 650 mg via RECTAL

## 2021-12-24 MED ORDER — NITROGLYCERIN IN D5W 200-5 MCG/ML-% IV SOLN
INTRAVENOUS | Status: DC | PRN
  Administered 2021-12-24: 5 ug/min via INTRAVENOUS

## 2021-12-24 MED ORDER — ROCURONIUM BROMIDE 10 MG/ML (PF) SYRINGE
PREFILLED_SYRINGE | INTRAVENOUS | Status: AC
  Filled 2021-12-24: qty 20

## 2021-12-24 MED ORDER — PROPOFOL 10 MG/ML IV BOLUS
INTRAVENOUS | Status: DC | PRN
  Administered 2021-12-24: 50 mg via INTRAVENOUS
  Administered 2021-12-24: 150 mg via INTRAVENOUS

## 2021-12-24 MED ORDER — MORPHINE SULFATE (PF) 2 MG/ML IV SOLN
1.0000 mg | INTRAVENOUS | Status: DC | PRN
  Administered 2021-12-24 (×3): 4 mg via INTRAVENOUS
  Administered 2021-12-25 (×4): 2 mg via INTRAVENOUS
  Administered 2021-12-25: 4 mg via INTRAVENOUS
  Administered 2021-12-25 – 2021-12-26 (×11): 2 mg via INTRAVENOUS
  Filled 2021-12-24: qty 2
  Filled 2021-12-24 (×4): qty 1
  Filled 2021-12-24: qty 2
  Filled 2021-12-24 (×2): qty 1
  Filled 2021-12-24: qty 2
  Filled 2021-12-24: qty 1
  Filled 2021-12-24: qty 2
  Filled 2021-12-24 (×9): qty 1

## 2021-12-24 MED ORDER — AMIODARONE HCL IN DEXTROSE 360-4.14 MG/200ML-% IV SOLN
30.0000 mg/h | INTRAVENOUS | Status: DC
  Administered 2021-12-25: 30 mg/h via INTRAVENOUS
  Filled 2021-12-24: qty 200

## 2021-12-24 MED ORDER — 0.9 % SODIUM CHLORIDE (POUR BTL) OPTIME
TOPICAL | Status: DC | PRN
  Administered 2021-12-24: 5000 mL

## 2021-12-24 MED ORDER — PROPOFOL 10 MG/ML IV BOLUS
INTRAVENOUS | Status: AC
  Filled 2021-12-24: qty 20

## 2021-12-24 MED ORDER — PHENYLEPHRINE 80 MCG/ML (10ML) SYRINGE FOR IV PUSH (FOR BLOOD PRESSURE SUPPORT)
PREFILLED_SYRINGE | INTRAVENOUS | Status: AC
  Filled 2021-12-24: qty 10

## 2021-12-24 MED ORDER — FENTANYL CITRATE (PF) 250 MCG/5ML IJ SOLN
INTRAMUSCULAR | Status: DC | PRN
  Administered 2021-12-24: 150 ug via INTRAVENOUS
  Administered 2021-12-24: 50 ug via INTRAVENOUS
  Administered 2021-12-24: 150 ug via INTRAVENOUS
  Administered 2021-12-24 (×2): 100 ug via INTRAVENOUS
  Administered 2021-12-24: 50 ug via INTRAVENOUS
  Administered 2021-12-24: 100 ug via INTRAVENOUS
  Administered 2021-12-24: 250 ug via INTRAVENOUS
  Administered 2021-12-24 (×2): 150 ug via INTRAVENOUS

## 2021-12-24 MED ORDER — DOCUSATE SODIUM 100 MG PO CAPS
200.0000 mg | ORAL_CAPSULE | Freq: Every day | ORAL | Status: DC
  Administered 2021-12-25 – 2021-12-28 (×4): 200 mg via ORAL
  Filled 2021-12-24 (×4): qty 2

## 2021-12-24 MED ORDER — PROTAMINE SULFATE 10 MG/ML IV SOLN
INTRAVENOUS | Status: AC
  Filled 2021-12-24: qty 25

## 2021-12-24 MED ORDER — POTASSIUM CHLORIDE 10 MEQ/50ML IV SOLN: 10.0000 meq | INTRAVENOUS | Status: AC

## 2021-12-24 MED ORDER — HEPARIN SODIUM (PORCINE) 1000 UNIT/ML IJ SOLN
INTRAMUSCULAR | Status: AC
  Filled 2021-12-24: qty 10

## 2021-12-24 MED ORDER — PLASMA-LYTE A IV SOLN: INTRAVENOUS | Status: DC | PRN

## 2021-12-24 MED ORDER — INSULIN REGULAR(HUMAN) IN NACL 100-0.9 UT/100ML-% IV SOLN
INTRAVENOUS | Status: DC
  Administered 2021-12-25: 4.4 [IU]/h via INTRAVENOUS
  Filled 2021-12-24: qty 100

## 2021-12-24 MED ORDER — ROCURONIUM BROMIDE 10 MG/ML (PF) SYRINGE
PREFILLED_SYRINGE | INTRAVENOUS | Status: DC | PRN
  Administered 2021-12-24 (×3): 50 mg via INTRAVENOUS
  Administered 2021-12-24: 20 mg via INTRAVENOUS
  Administered 2021-12-24: 50 mg via INTRAVENOUS
  Administered 2021-12-24: 30 mg via INTRAVENOUS
  Administered 2021-12-24: 50 mg via INTRAVENOUS

## 2021-12-24 MED ORDER — LACTATED RINGERS IV SOLN: 500.0000 mL | Freq: Once | INTRAVENOUS | Status: DC | PRN

## 2021-12-24 MED ORDER — ALBUMIN HUMAN 5 % IV SOLN
250.0000 mL | INTRAVENOUS | Status: AC | PRN
  Administered 2021-12-24 – 2021-12-25 (×2): 12.5 g via INTRAVENOUS

## 2021-12-24 MED ORDER — DEXTROSE 50 % IV SOLN: 0.0000 mL | INTRAVENOUS | Status: DC | PRN

## 2021-12-24 MED ORDER — AMLODIPINE BESYLATE 2.5 MG PO TABS
2.5000 mg | ORAL_TABLET | Freq: Every day | ORAL | Status: DC
  Filled 2021-12-24: qty 1

## 2021-12-24 MED ORDER — ALBUMIN HUMAN 5 % IV SOLN: INTRAVENOUS | Status: DC | PRN

## 2021-12-24 MED ORDER — PHENYLEPHRINE HCL-NACL 20-0.9 MG/250ML-% IV SOLN
INTRAVENOUS | Status: DC | PRN
  Administered 2021-12-24: 15 ug/min via INTRAVENOUS

## 2021-12-24 MED ORDER — METOPROLOL TARTRATE 12.5 MG HALF TABLET
12.5000 mg | ORAL_TABLET | Freq: Two times a day (BID) | ORAL | Status: DC
  Administered 2021-12-25: 12.5 mg via ORAL
  Filled 2021-12-24 (×3): qty 1

## 2021-12-24 MED ORDER — TRANEXAMIC ACID 1000 MG/10ML IV SOLN
INTRAVENOUS | Status: DC | PRN
  Administered 2021-12-24: 1.5 mg/kg/h via INTRAVENOUS

## 2021-12-24 MED ORDER — PHENYLEPHRINE HCL-NACL 20-0.9 MG/250ML-% IV SOLN
0.0000 ug/min | INTRAVENOUS | Status: DC
  Administered 2021-12-25: 20 ug/min via INTRAVENOUS
  Administered 2021-12-25: 40 ug/min via INTRAVENOUS
  Filled 2021-12-24 (×2): qty 250

## 2021-12-24 MED ORDER — PHENYLEPHRINE 80 MCG/ML (10ML) SYRINGE FOR IV PUSH (FOR BLOOD PRESSURE SUPPORT)
PREFILLED_SYRINGE | INTRAVENOUS | Status: DC | PRN
  Administered 2021-12-24 (×2): 80 ug via INTRAVENOUS

## 2021-12-24 MED ORDER — CHLORHEXIDINE GLUCONATE 0.12 % MT SOLN
15.0000 mL | OROMUCOSAL | Status: AC
  Administered 2021-12-24: 15 mL via OROMUCOSAL

## 2021-12-24 MED ORDER — METOPROLOL TARTRATE 25 MG/10 ML ORAL SUSPENSION
12.5000 mg | Freq: Two times a day (BID) | ORAL | Status: DC
  Administered 2021-12-24: 12.5 mg
  Filled 2021-12-24: qty 10

## 2021-12-24 MED ORDER — DEXMEDETOMIDINE HCL IN NACL 400 MCG/100ML IV SOLN
INTRAVENOUS | Status: DC | PRN
  Administered 2021-12-24: .7 ug/kg/h via INTRAVENOUS

## 2021-12-24 MED ORDER — SODIUM CHLORIDE (PF) 0.9 % IJ SOLN: OROMUCOSAL | Status: DC | PRN

## 2021-12-24 MED ORDER — VANCOMYCIN HCL 1000 MG IV SOLR
INTRAVENOUS | Status: DC | PRN
  Administered 2021-12-24: 1500 mg via INTRAVENOUS

## 2021-12-24 MED ORDER — ORAL CARE MOUTH RINSE: 15.0000 mL | Freq: Once | OROMUCOSAL | Status: DC

## 2021-12-24 MED ORDER — FAMOTIDINE IN NACL 20-0.9 MG/50ML-% IV SOLN
20.0000 mg | Freq: Two times a day (BID) | INTRAVENOUS | Status: AC
  Administered 2021-12-24: 20 mg via INTRAVENOUS
  Filled 2021-12-24: qty 50

## 2021-12-24 MED ORDER — MIDAZOLAM HCL (PF) 10 MG/2ML IJ SOLN
INTRAMUSCULAR | Status: AC
  Filled 2021-12-24: qty 2

## 2021-12-24 MED ORDER — SODIUM CHLORIDE 0.9 % IV SOLN: 250.0000 mL | INTRAVENOUS | Status: DC

## 2021-12-24 MED ORDER — ARTIFICIAL TEARS OPHTHALMIC OINT
TOPICAL_OINTMENT | OPHTHALMIC | Status: DC | PRN
  Administered 2021-12-24: 1 via OPHTHALMIC

## 2021-12-24 MED ORDER — ACETAMINOPHEN 160 MG/5ML PO SOLN: 650.0000 mg | Freq: Once | ORAL | Status: AC

## 2021-12-24 MED ORDER — ROCURONIUM BROMIDE 10 MG/ML (PF) SYRINGE
PREFILLED_SYRINGE | INTRAVENOUS | Status: AC
  Filled 2021-12-24: qty 10

## 2021-12-24 SURGICAL SUPPLY — 91 items
BAG DECANTER FOR FLEXI CONT (MISCELLANEOUS) ×2 IMPLANT
BLADE CLIPPER SURG (BLADE) ×2 IMPLANT
BLADE STERNUM SYSTEM 6 (BLADE) ×2 IMPLANT
BNDG ELASTIC 4X5.8 VLCR STR LF (GAUZE/BANDAGES/DRESSINGS) ×2 IMPLANT
BNDG ELASTIC 6X5.8 VLCR STR LF (GAUZE/BANDAGES/DRESSINGS) ×2 IMPLANT
BNDG GAUZE DERMACEA FLUFF 4 (GAUZE/BANDAGES/DRESSINGS) ×2 IMPLANT
CABLE SURGICAL S-101-97-12 (CABLE) ×2 IMPLANT
CANISTER SUCT 3000ML PPV (MISCELLANEOUS) ×2 IMPLANT
CANNULA MC2 2 STG 29/37 NON-V (CANNULA) ×2 IMPLANT
CANNULA MC2 TWO STAGE (CANNULA) ×2
CANNULA NON VENT 20FR 12 (CANNULA) ×2 IMPLANT
CATH ROBINSON RED A/P 18FR (CATHETERS) ×4 IMPLANT
CLIP RETRACTION 3.0MM CORONARY (MISCELLANEOUS) ×2 IMPLANT
CLIP TI MEDIUM 24 (CLIP) IMPLANT
CLIP VESOCCLUDE MED 24/CT (CLIP) IMPLANT
CLIP VESOCCLUDE SM WIDE 24/CT (CLIP) IMPLANT
CNTNR URN SCR LID CUP LEK RST (MISCELLANEOUS) IMPLANT
CONN ST 1/2X1/2  BEN (MISCELLANEOUS)
CONN ST 1/2X1/2 BEN (MISCELLANEOUS) ×2 IMPLANT
CONNECTOR BLAKE 2:1 CARIO BLK (MISCELLANEOUS) ×2 IMPLANT
CONT SPEC 4OZ STRL OR WHT (MISCELLANEOUS) ×2
CONTAINER PROTECT SURGISLUSH (MISCELLANEOUS) ×4 IMPLANT
DERMABOND ADVANCED (GAUZE/BANDAGES/DRESSINGS) ×2
DERMABOND ADVANCED .7 DNX12 (GAUZE/BANDAGES/DRESSINGS) IMPLANT
DRAIN CHANNEL 19F RND (DRAIN) ×6 IMPLANT
DRAIN CONNECTOR BLAKE 1:1 (MISCELLANEOUS) ×2 IMPLANT
DRAPE CARDIOVASCULAR INCISE (DRAPES) ×2
DRAPE INCISE IOBAN 66X45 STRL (DRAPES) IMPLANT
DRAPE SRG 135X102X78XABS (DRAPES) ×2 IMPLANT
DRAPE WARM FLUID 44X44 (DRAPES) ×2 IMPLANT
DRSG AQUACEL AG ADV 3.5X10 (GAUZE/BANDAGES/DRESSINGS) ×2 IMPLANT
DRSG AQUACEL AG ADV 3.5X14 (GAUZE/BANDAGES/DRESSINGS) IMPLANT
ELECT BLADE 4.0 EZ CLEAN MEGAD (MISCELLANEOUS) ×2
ELECT REM PT RETURN 9FT ADLT (ELECTROSURGICAL) ×4
ELECTRODE BLDE 4.0 EZ CLN MEGD (MISCELLANEOUS) ×2 IMPLANT
ELECTRODE REM PT RTRN 9FT ADLT (ELECTROSURGICAL) ×4 IMPLANT
FELT TEFLON 1X6 (MISCELLANEOUS) ×4 IMPLANT
GAUZE 4X4 16PLY ~~LOC~~+RFID DBL (SPONGE) ×2 IMPLANT
GAUZE SPONGE 4X4 12PLY STRL (GAUZE/BANDAGES/DRESSINGS) ×4 IMPLANT
GLOVE BIO SURGEON STRL SZ 6.5 (GLOVE) IMPLANT
GLOVE BIO SURGEON STRL SZ7 (GLOVE) ×4 IMPLANT
GLOVE BIOGEL M STRL SZ7.5 (GLOVE) ×4 IMPLANT
GLOVE SS BIOGEL STRL SZ 6 (GLOVE) IMPLANT
GOWN STRL REUS W/ TWL LRG LVL3 (GOWN DISPOSABLE) ×8 IMPLANT
GOWN STRL REUS W/ TWL XL LVL3 (GOWN DISPOSABLE) ×4 IMPLANT
GOWN STRL REUS W/TWL LRG LVL3 (GOWN DISPOSABLE) ×12
GOWN STRL REUS W/TWL XL LVL3 (GOWN DISPOSABLE) ×12
HEMOSTAT POWDER SURGIFOAM 1G (HEMOSTASIS) ×6 IMPLANT
INSERT SUTURE HOLDER (MISCELLANEOUS) ×2 IMPLANT
KIT BASIN OR (CUSTOM PROCEDURE TRAY) ×2 IMPLANT
KIT SUCTION CATH 14FR (SUCTIONS) ×2 IMPLANT
KIT TURNOVER KIT B (KITS) ×2 IMPLANT
KIT VASOVIEW HEMOPRO 2 VH 4000 (KITS) ×2 IMPLANT
LEAD PACING MYOCARDI (MISCELLANEOUS) ×2 IMPLANT
MARKER GRAFT CORONARY BYPASS (MISCELLANEOUS) ×6 IMPLANT
NS IRRIG 1000ML POUR BTL (IV SOLUTION) ×10 IMPLANT
PACK ACCESSORY CANNULA KIT (KITS) ×2 IMPLANT
PACK E OPEN HEART (SUTURE) ×2 IMPLANT
PACK OPEN HEART (CUSTOM PROCEDURE TRAY) ×2 IMPLANT
PAD ARMBOARD 7.5X6 YLW CONV (MISCELLANEOUS) ×4 IMPLANT
PAD ELECT DEFIB RADIOL ZOLL (MISCELLANEOUS) ×2 IMPLANT
PENCIL BUTTON HOLSTER BLD 10FT (ELECTRODE) ×2 IMPLANT
POSITIONER HEAD DONUT 9IN (MISCELLANEOUS) ×2 IMPLANT
PUNCH AORTIC ROTATE 4.0MM (MISCELLANEOUS) ×2 IMPLANT
SET MPS 3-ND DEL (MISCELLANEOUS) IMPLANT
SHEARS HARMONIC 9CM CVD (BLADE) IMPLANT
SPONGE T-LAP 18X18 ~~LOC~~+RFID (SPONGE) ×8 IMPLANT
SUPPORT HEART JANKE-BARRON (MISCELLANEOUS) ×2 IMPLANT
SUT BONE WAX W31G (SUTURE) ×2 IMPLANT
SUT ETHIBOND X763 2 0 SH 1 (SUTURE) ×4 IMPLANT
SUT MNCRL AB 3-0 PS2 18 (SUTURE) ×4 IMPLANT
SUT MNCRL AB 4-0 PS2 18 (SUTURE) IMPLANT
SUT PDS AB 1 CTX 36 (SUTURE) ×4 IMPLANT
SUT PROLENE 4 0 RB 1 (SUTURE) ×2
SUT PROLENE 4 0 SH DA (SUTURE) ×2 IMPLANT
SUT PROLENE 4-0 RB1 .5 CRCL 36 (SUTURE) IMPLANT
SUT PROLENE 5 0 C 1 36 (SUTURE) ×6 IMPLANT
SUT PROLENE 7 0 BV 1 (SUTURE) IMPLANT
SUT PROLENE 7 0 BV1 MDA (SUTURE) ×2 IMPLANT
SUT STEEL 6MS V (SUTURE) ×4 IMPLANT
SUT STEEL STERNAL CCS#1 18IN (SUTURE) IMPLANT
SUT VIC AB 2-0 CT1 27 (SUTURE) ×8
SUT VIC AB 2-0 CT1 TAPERPNT 27 (SUTURE) IMPLANT
SYSTEM SAHARA CHEST DRAIN ATS (WOUND CARE) ×2 IMPLANT
TOWEL GREEN STERILE (TOWEL DISPOSABLE) ×2 IMPLANT
TOWEL GREEN STERILE FF (TOWEL DISPOSABLE) ×2 IMPLANT
TRAY FOLEY SLVR 16FR TEMP STAT (SET/KITS/TRAYS/PACK) ×2 IMPLANT
TUBING LAP HI FLOW INSUFFLATIO (TUBING) ×2 IMPLANT
UNDERPAD 30X36 HEAVY ABSORB (UNDERPADS AND DIAPERS) ×2 IMPLANT
WATER STERILE IRR 1000ML POUR (IV SOLUTION) ×4 IMPLANT
YANKAUER SUCT BULB TIP NO VENT (SUCTIONS) IMPLANT

## 2021-12-24 NOTE — Plan of Care (Signed)
  Problem: Education: Goal: Understanding of CV disease, CV risk reduction, and recovery process will improve Outcome: Progressing Goal: Individualized Educational Video(s) Outcome: Progressing   Problem: Activity: Goal: Ability to return to baseline activity level will improve Outcome: Progressing   Problem: Cardiovascular: Goal: Ability to achieve and maintain adequate cardiovascular perfusion will improve Outcome: Progressing   

## 2021-12-24 NOTE — Progress Notes (Signed)
     [address]       [place] [address]             [phone]       No events Vitals:   12/24/21 0850 12/24/21 0954  BP: 137/77 (!) 155/87  Pulse: 66 69  Resp: 19 18  Temp: 98.6 F (37 C) 99.1 F (37.3 C)  SpO2: 95% 95%   Alert NAD Sinus EWOB ND  OR today for CABG w/ L radial artery harvest  Kore Madlock O Rafe Mackowski

## 2021-12-24 NOTE — Progress Notes (Signed)
Mupirocin nasal swabbing complete. CHG bath complete. SRP , RN

## 2021-12-24 NOTE — Anesthesia Preprocedure Evaluation (Addendum)
Anesthesia Evaluation  Patient identified by MRN, date of birth, ID band Patient awake    Reviewed: Allergy & Precautions, NPO status , Patient's Chart, lab work & pertinent test results  Airway Mallampati: II  TM Distance: >3 FB     Dental   Pulmonary former smoker,    breath sounds clear to auscultation       Cardiovascular hypertension, + angina + CAD   Rhythm:Regular Rate:Normal     Neuro/Psych    GI/Hepatic negative GI ROS, Neg liver ROS,   Endo/Other  diabetes  Renal/GU negative Renal ROS     Musculoskeletal   Abdominal   Peds  Hematology   Anesthesia Other Findings   Reproductive/Obstetrics                             Anesthesia Physical Anesthesia Plan  ASA: 3  Anesthesia Plan: General   Post-op Pain Management:    Induction: Intravenous  PONV Risk Score and Plan: Treatment may vary due to age or medical condition, Ondansetron, Dexamethasone and Midazolam  Airway Management Planned: Oral ETT  Additional Equipment: Arterial line, PA Cath, CVP and Ultrasound Guidance Line Placement  Intra-op Plan:   Post-operative Plan: Post-operative intubation/ventilation  Informed Consent: I have reviewed the patients History and Physical, chart, labs and discussed the procedure including the risks, benefits and alternatives for the proposed anesthesia with the patient or authorized representative who has indicated his/her understanding and acceptance.     Dental advisory given  Plan Discussed with:   Anesthesia Plan Comments:         Anesthesia Quick Evaluation

## 2021-12-24 NOTE — Progress Notes (Signed)
Echocardiogram Echocardiogram Transesophageal has been performed.  Jonathan Riley 12/24/2021, 2:17 PM

## 2021-12-24 NOTE — Transfer of Care (Signed)
Immediate Anesthesia Transfer of Care Note  Patient: Jonathan Riley  Procedure(s) Performed: CORONARY ARTERY BYPASS GRAFTING (CABG) X 5 BYPASSES USING OPEN LEFT INTERNAL MAMMARY ARTERY, OPEN LEFT RADIAL ARTERY, AND OPEN RIGHT GREATER SAPHENOUS VEIN HARVEST. (Chest) TRANSESOPHAGEAL ECHOCARDIOGRAM (TEE) (Esophagus)  Patient Location: SICU  Anesthesia Type:General  Level of Consciousness: Patient remains intubated per anesthesia plan  Airway & Oxygen Therapy: Patient remains intubated per anesthesia plan and Patient placed on Ventilator (see vital sign flow sheet for setting)  Post-op Assessment: Report given to RN and Post -op Vital signs reviewed and stable  Post vital signs: Reviewed and stable  Last Vitals:  Vitals Value Taken Time  BP 106/50 12/24/21 1826  Temp 37.1 C 12/24/21 1841  Pulse 60 12/24/21 1841  Resp 18 12/24/21 1841  SpO2 95 % 12/24/21 1841  Vitals shown include unvalidated device data.  Last Pain:  Vitals:   12/24/21 1006  TempSrc:   PainSc: 0-No pain      Patients Stated Pain Goal: 0 (12/23/21 2000)  Complications: No notable events documented.

## 2021-12-24 NOTE — Anesthesia Procedure Notes (Signed)
Arterial Line Insertion Start/End9/08/2021 10:25 AM, 12/24/2021 10:30 AM Performed by: Rachel Moulds, CRNA  Patient location: Pre-op. Preanesthetic checklist: patient identified, IV checked, site marked, risks and benefits discussed, surgical consent, monitors and equipment checked, pre-op evaluation, timeout performed and anesthesia consent Lidocaine 1% used for infiltration Right, radial was placed Catheter size: 20 G Hand hygiene performed  and maximum sterile barriers used  Allen's test indicative of satisfactory collateral circulation Attempts: 2 Procedure performed without using ultrasound guided technique. Following insertion, Biopatch and dressing applied. Post procedure assessment: normal  Patient tolerated the procedure well with no immediate complications.

## 2021-12-24 NOTE — Progress Notes (Signed)
  Called place to main OR, attendant will return call with follow up care. SRP, RN  MRSA, PCR NEGATIVE NEGATIVE  NEGATIVE   Staphylococcus aureus NEGATIVE POSITIVE Abnormal

## 2021-12-24 NOTE — Anesthesia Procedure Notes (Signed)
Procedure Name: Intubation Date/Time: 12/24/2021 12:20 PM  Performed by: Wilburn Cornelia, CRNAPre-anesthesia Checklist: Patient identified, Suction available, Emergency Drugs available, Patient being monitored and Timeout performed Patient Re-evaluated:Patient Re-evaluated prior to induction Oxygen Delivery Method: Circle system utilized Preoxygenation: Pre-oxygenation with 100% oxygen Induction Type: IV induction Ventilation: Mask ventilation without difficulty Laryngoscope Size: Mac and 4 Grade View: Grade I Tube type: Oral Tube size: 8.0 mm Number of attempts: 1 Airway Equipment and Method: Stylet Placement Confirmation: ETT inserted through vocal cords under direct vision, positive ETCO2, CO2 detector and breath sounds checked- equal and bilateral Secured at: 23 cm Tube secured with: Tape Dental Injury: Teeth and Oropharynx as per pre-operative assessment

## 2021-12-24 NOTE — Progress Notes (Signed)
ABG attempted. Insufficient sample x2 and clotted. Should obtain in pre-op holding post line placement.

## 2021-12-24 NOTE — Brief Op Note (Signed)
12/20/2021 - 12/24/2021  8:13 AM  PATIENT:  Jonathan Riley  61 y.o. male  PRE-OPERATIVE DIAGNOSIS:  CORONARY ARTERY DISEASE  POST-OPERATIVE DIAGNOSIS:  CORONARY ARTERY DISEASE  PROCEDURE:   CORONARY ARTERY BYPASS GRAFTING (CABG) X 5 BYPASSES USING: -LIMA TO LAD -RADIAL TO PDA -SVG TO OM1 -SVG TO OM3 -SVG TO DIAGONAL  OPEN LEFT INTERNAL MAMMARY ARTERY   OPEN LEFT RADIAL ARTERY Artery harvest time: Artery prep time:  OPEN RIGHT GREATER SAPHENOUS VEIN HARVEST.  Vein harvest time: Vein prep time:  TRANSESOPHAGEAL ECHOCARDIOGRAM (TEE)   SURGEON:  Surgeon(s) and Role: Lightfoot, Eliezer Lofts, MD - Primary  PHYSICIAN ASSISTANT: Aloha Gell PA-C, Donielle Joycelyn Man PA-C, Myron Roddenberry PA-C  ASSISTANTS: Kristie Cowman, RNFA   ANESTHESIA:   general  EBL:  1250 mL   BLOOD ADMINISTERED:none  DRAINS:  Mediastinal and pleural chest tubes    LOCAL MEDICATIONS USED:  NONE  SPECIMEN:  No Specimen  DISPOSITION OF SPECIMEN:  N/A  COUNTS CORRECT:  YES  DICTATION: .Dragon Dictation  PLAN OF CARE: Admit to inpatient   PATIENT DISPOSITION:  ICU - intubated and hemodynamically stable.   Delay start of Pharmacological VTE agent (>24hrs) due to surgical blood loss or risk of bleeding: yes

## 2021-12-24 NOTE — Hospital Course (Addendum)
History of Present Illness:     [name] is a 61 year old male with a past history of hypertension, dyslipidemia, and type 2 diabetes mellitus who recently sought medical attention for evaluation of dyspnea on exertion.  He was seen by Dr. Janne Napoleon who recommended coronary CTA.  That study demonstrated  a coronary calcium score of 1633 which was in the 98th percentile for his demographic control.  Further evaluation with left heart catheterization was recommended and was carried out electively earlier today.  This demonstrates severe three-vessel coronary artery disease which is detailed in the report below.  In addition, the cardiology team felt there was possibly a left main coronary dissection that occurred on the initial injection of contrast. An echocardiogram obtained last month showed preserved left ventricular function with an ejection fraction of 60 to 65%. The LVEDP was 26.  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 Currently, Jonathan Riley is currently resting comfortably and is pain-free.  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 He is right handed. Cardiothoracic surgery was asked to consult on Jonathan Riley' case for possible surgical revascularization. Jonathan Riley reviewed the patient's chart, diagnostic studies and labs and it was thought that Jonathan Riley' best long term treatment option would be coronary artery bypass grafting. Jonathan Riley discussed his treatment options as well as the risks of surgery with him and his wife. After careful consideration, Jonathan Riley decided to move forward with coronary artery bypass grafting surgery.   Hospital Course: Jonathan Riley has been an inpatient at [facility] since 12/20/21 when his cardiac catheterization was performed and discovered  left main coronary artery dissection. Jonathan Riley was brought back to the operating room on  12/24/2021 where he underwent a CABG x 5 utilizing LIMA to LAD, RADIAL ARTERY to PDA, SVG to OM1, SVG to OM3, and SVG to DIAGONAL. He tolerated the procedure well and was transferred to the SICU in stable condition.  Postoperative hospital course:  The patient has remained hemodynamically stable.  Due to intraoperative fibrillation he was placed on amiodarone drip.  He has subsequently been chemically cardioverted to sinus rhythm.  He was weaned from the ventilator using standard protocols but early postextubation he had increasing oxygen needs and was placed on BiPAP.  He has subsequently been weaned to high flow nasal cannula on postoperative day #1.  Procalcitonin level is elevated and due to concerns for possible HCAP he has been placed on Maxipime.  Leukocytosis is improving over time.  Oxygen requirements are decreasing over time.  On postoperative day 2 epicardial pacing wires and arterial line were discontinued.  Amiodarone has also been discontinued.  Beta-blocker is being titrated.  Metabolic acidosis is showing improvement on oral bicarb.  Pulmonary critical care medicine is assisting with intensive care unit management.  He continued to make a very steady progress in all regards.  Oxygen has been weaned to 2 L by postoperative day #5.  He continues aggressive pulmonary hygiene measures.  He is maintaining sinus rhythm.  He does have some hypertension and has been resumed on Norvasc and ARB.  His metabolic acidosis has resolved.  He does have an expected acute blood loss anemia which is stabilized and there is minor.  He did have a mild postoperative thrombocytopenia which has resolved.  He has required ongoing diuresis for volume overload and also has a small left-sided pleural effusion.  He  is responding well to this.

## 2021-12-24 NOTE — Progress Notes (Signed)
Patient sats dropped to 90%.  RT completed a recruitment maneuver for 2 minutes, sats recovered to 99%.  Patient tolerated well.  After 2 minutes, patient returned to SIMV/PRVC/PS settings.  Recruitment settings: PC 30 Peep +5 100%  RR 10 iT 3.00

## 2021-12-24 NOTE — Op Note (Signed)
[address]       [place] [address]             [phone]                                          12/24/2021 Patient:  Jonathan Riley Pre-Op Dx: 3V CAD HTN HLP DM   Post-op Dx:  same Procedure: CABG X 5.  LIMA LAD, L radial to PDA, RSVG OM3, OM1, Diagonal   Endoscopic greater saphenous vein harvest on the right Open left radial artery harvest   Surgeon and Role:      * Marrietta Thunder, Eliezer Lofts, MD - Primary    * B. Stehler , PA-C - assisting An experienced assistant was required given the complexity of this surgery and the standard of surgical care. The assistant was needed for exposure, dissection, suctioning, retraction of delicate tissues and sutures, instrument exchange and for overall help during this procedure.    Anesthesia  general EBL:  750 ml Blood Administration: none Xclamp Time:  75 min Pump Time:   Drains: 19 F blake drain:  L, mediastinal  Wires: ventricular Counts: correct   Indications: 61yo male with exertional anginal symptoms was found to have 3V CAD on LHC.  He was agreeable to proceed with CABG  Findings: Afib intra-op.  Good LIMA, radial and vein.  Calcified targets.  Good LAD, and diag.  Intramyocardial OM1.  Small calcified OM3, and PDA  Operative Technique: All invasive lines were placed in pre-op holding.  After the risks, benefits and alternatives were thoroughly discussed, the patient was brought to the operative theatre.  Anesthesia was induced, and the patient was prepped and draped in normal sterile fashion.  An appropriate surgical pause was performed, and pre-operative antibiotics were dosed accordingly.  We began with simultaneous incisions along the right leg for harvesting of the greater saphenous vein, the left arm for the radial artery and the chest for the sternotomy.  In regards to the sternotomy, this was carried down with bovie cautery, and the sternum was divided with a reciprocating saw.   Meticulous hemostasis was obtained.  The left internal thoracic artery was exposed and harvested in in pedicled fashion.  The patient was systemically heparinized, and the artery was divided distally, and placed in a papaverine sponge.    The sternal elevator was removed, and a retractor was placed.  The pericardium was divided in the midline and fashioned into a cradle with pericardial stitches.   After we confirmed an appropriate ACT, the ascending aorta was cannulated in standard fashion.  The right atrial appendage was used for venous cannulation site.  Cardiopulmonary bypass was initiated, and the heart retractor was placed. The cross clamp was applied, and a dose of anterograde cardioplegia was given with good arrest of the heart.  We moved to the posterior wall of the heart, and found a good target on the PDA.  An arteriotomy was made, and the radial artery graft was anastomosed to it in an end to side fashion.  Next we exposed the lateral wall, and found a good target on the OM3 and OM1.  An end to side anastomosis with the vein graft was then created to each.  Next, we exposed the anterior wall of the heart and identified a good target on diagonal.   An arteriotomy was  created.  The vein was anastomosed in an end to side fashion.  Finally, we exposed a good target on the LAD, and fashioned an end to side anastomosis between it and the LITA.  We began to re-warm, and a re-animation dose of cardioplegia was given.  The heart was de-aired, and the cross clamp was removed.  Meticulous hemostasis was obtained.    A partial occludding clamp was then placed on the ascending aorta, and we created an end to side anastomosis between it and the OM 1 proximal vein graft and the radial artery graft.  The OM3 vein graft was jumped off the hood of the radial artery.  The diagonal graft was jumped off the hood of the OM1 graft  Rings were placed on the proximal anastomosis.  Hemostasis was obtained, and we separated  from cardiopulmonary bypass without event.  The heparin was reversed with protamine.  Chest tubes and wires were placed, and the sternum was re-approximated with sternal wires.  The soft tissue and skin were re-approximated wth absorbable suture.    The patient tolerated the procedure without any immediate complications, and was transferred to the ICU in guarded condition.  Jonathan Riley

## 2021-12-24 NOTE — Progress Notes (Signed)
Resp attempt to obtain ABG's pre-op, unsuccessful team, will obtain in holding area. SRP RN

## 2021-12-24 NOTE — Progress Notes (Signed)
ANTICOAGULATION CONSULT NOTE   Pharmacy Consult for Heparin Indication: chest pain/ACS  Allergies  Allergen Reactions   Percocet [Oxycodone-Acetaminophen] Nausea And Vomiting   Crestor [Rosuvastatin]     Other reaction(s): myalgia/arthralgia (06/2020)    Patient Measurements: Height: 5\' 10"  (177.8 cm) Weight: 111 kg (244 lb 11.2 oz) IBW/kg (Calculated) : 73 Heparin Dosing Weight: 97.4 kg   Vital Signs: Temp: 98.8 F (37.1 C) (09/05 0322) Temp Source: Oral (09/05 0322) BP: 143/83 (09/04 1951) Pulse Rate: 59 (09/04 1951)  Labs: Recent Labs    12/22/21 0726 12/22/21 0727 12/22/21 1531 12/23/21 0229 12/23/21 2023 12/24/21 0314  HGB 15.2  --   --  15.1  --  15.4  HCT 45.4  --   --  43.4  --  44.1  PLT 172  --   --  161  --  152  APTT  --   --   --   --  70*  --   LABPROT  --   --   --   --  13.0  --   INR  --   --   --   --  1.0  --   HEPARINUNFRC 0.47  --  0.34 0.40  --  0.26*  CREATININE  --  0.86  --  1.00  --  0.89     Estimated Creatinine Clearance: 110.1 mL/min (by C-G formula based on SCr of 0.89 mg/dL).   Medical History: Past Medical History:  Diagnosis Date   Arthritis    Depression    Diabetes mellitus without complication (HCC)    Dyspnea    "with exertion"   Hypertension    Nocturia    1-2 times during night   PONV (postoperative nausea and vomiting)    "nausea after a knee surgery"    Assessment: 61 yo male presented on 12/20/21 for CAD. S/p cath 9/1 with 25-305 stenosis and possible catheter induced dissection/trauma, CVTS consulted and plan for surgery on 9/5. Pharmacy consulted to dose heparin.  Heparin level subtherapeutic at 0.26 on 2150 units/hr. No line issues or signs/symptoms of bleeding per RN. - Patient to go for CABG this morning at 1100, will increase conservatively   Goal of Therapy:  Heparin level 0.3-0.7 units/ml Monitor platelets by anticoagulation protocol: Yes   Plan:  Increase heparin to 2250 units/hr. Monitor heparin  level, CBC and s/sx of bleeding daily.  [id number], PharmD Clinical Pharmacist 12/24/2021 4:57 AM Please check AMION for all [facility] Pharmacy numbers

## 2021-12-24 NOTE — Progress Notes (Signed)
CARDIAC REHAB PHASE I     Stopped by to see pt, he is feeling good and ready for OR today. No questions or concerns regarding pre-op [facility] teaching provided. Will continue to follow.  [phone]  Woodroe Chen, RN BSN 12/24/2021 8:22 AM

## 2021-12-24 NOTE — Progress Notes (Signed)
Pt shave and clipped prepared for surgery. Consent signed and on chart, checklist started. Basic Peri-op education completed and question addressed. Night bath done and mouth care, will cont and complete am procedure as ordered. SRP, RN

## 2021-12-24 NOTE — Anesthesia Procedure Notes (Addendum)
Central Venous Catheter Insertion Performed by: Dorris Singh, MD, anesthesiologist Start/End9/08/2021 11:20 AM, 12/24/2021 11:35 AM Patient location: Pre-op. Preanesthetic checklist: patient identified, IV checked, site marked, risks and benefits discussed, surgical consent, monitors and equipment checked, pre-op evaluation, timeout performed and anesthesia consent Lidocaine 1% used for infiltration and patient sedated Hand hygiene performed  and maximum sterile barriers used  Catheter size: 8.5 Fr Sheath introducer Procedure performed using ultrasound guided technique. Ultrasound Notes:anatomy identified, needle tip was noted to be adjacent to the nerve/plexus identified, no ultrasound evidence of intravascular and/or intraneural injection and image(s) printed for medical record Attempts: 1 Following insertion, line sutured and dressing applied. Post procedure assessment: blood return through all ports, free fluid flow and no air  Patient tolerated the procedure well with no immediate complications. Additional procedure comments: CVP catheter place through introducer without difficulty. Pt tolerated procedure well.Marland Kitchen

## 2021-12-24 NOTE — Consult Note (Signed)
NAME:  Jonathan Riley, MRN:  [id number], DOB:  November 26, 1960, LOS: 4 ADMISSION DATE:  12/20/2021, CONSULTATION DATE: 12/24/21 REFERRING MD:  Cliffton Asters, CHIEF COMPLAINT:   CABG  History of Present Illness:  Jonathan Riley is a 61 y.o. M with PMH of HTN, type 2 DM, HL, tobacco use, multivessal CAD  who underwent CABG 9/5 and was transferred to the ICU post-procedure.   He was initially evaluated by cardiology in August after he noted worsening dyspnea on exertion.   A coronary CTA showed moderate stenosis of the LAD. Lcx, OM2 and RCA, LHC 9/1 with severe circumflex disease and high grade mid RCA stenosis.   Echo with preserved EF   Pertinent  Medical History   has a past medical history of Arthritis, Depression, Diabetes mellitus without complication (HCC), Dyspnea, Hypertension, Nocturia, and PONV (postoperative nausea and vomiting).  Significant Hospital Events: Including procedures, antibiotic start and stop dates in addition to other pertinent events   9/5 CABG, intubated, admit to ICU  Interim History / Subjective:  CABG without complication, arrived to ICU intubated and hemodynamically stable  Objective   Blood pressure (!) 155/87, pulse 69, temperature 99.1 F (37.3 C), temperature source Oral, resp. rate 18, height 5\' 10"  (1.778 m), weight 110.7 kg, SpO2 95 %.        Intake/Output Summary (Last 24 hours) at 12/24/2021 1204 Last data filed at 12/23/2021 1800 Gross per 24 hour  Intake 240 ml  Output --  Net 240 ml   Filed Weights   12/21/21 0359 12/24/21 0325 12/24/21 0954  Weight: 111.4 kg 111 kg 110.7 kg    General:  critically ill M, intubated and sedated HEENT: MM pink/moist, ETT in place, sclera anicteric Neuro: examined on sedation, RASS -5, breathing over the vent  CV: s1s2 rrr, no m/r/g PULM:  mechanically ventilated, no wheezing or rhonchi GI: soft, obese, non-distended  Extremities: warm/dry, trace edema  Skin: no rashes or lesions   Resolved Hospital Problem list      Assessment & Plan:    Multi-vessel coronary artery disease s/p CABG Post-op Ventilator management  HL HTN -post-op management per primary, Asa, statin, BB, ARB on hold  -Nicardipine -precedex -Maintain full vent support with SAT/SBT as tolerated -titrate Vent setting to maintain SpO2 greater than or equal to 90%. -HOB elevated 30 degrees. -Plateau pressures less than 30 cm H20.  -Follow chest x-ray, ABG prn.   -Bronchial hygiene and RT/bronchodilator protocol.  Type 2 DM -insulin gtt overnight per protocol -likely transition to SSI tomorrow    Best Practice (right click and "Reselect all SmartList Selections" daily)  Per primary  Labs   CBC: Recent Labs  Lab 12/20/21 1210 12/21/21 0705 12/22/21 0726 12/23/21 0229 12/24/21 0314  WBC 5.6 6.5 6.6 7.2 7.5  HGB 14.8 15.0 15.2 15.1 15.4  HCT 42.8 43.5 45.4 43.4 44.1  MCV 78.2* 78.1* 79.4* 78.6* 78.1*  PLT 160 175 172 161 152    Basic Metabolic Panel: Recent Labs  Lab 12/20/21 1210 12/22/21 0727 12/23/21 0229 12/24/21 0314  NA  --  139 138 139  K  --  4.5 3.9 4.0  CL  --  108 106 105  CO2  --  23 24 21*  GLUCOSE  --  140* 188* 166*  BUN  --  10 12 11   CREATININE 0.81 0.86 1.00 0.89  CALCIUM  --  9.2 9.3 9.5   GFR: Estimated Creatinine Clearance: 110 mL/min (by C-G formula based on SCr of 0.89  mg/dL). Recent Labs  Lab 12/21/21 0705 12/22/21 0726 12/23/21 0229 12/24/21 0314  WBC 6.5 6.6 7.2 7.5    Liver Function Tests: No results for input(s): "AST", "ALT", "ALKPHOS", "BILITOT", "PROT", "ALBUMIN" in the last 168 hours. No results for input(s): "LIPASE", "AMYLASE" in the last 168 hours. No results for input(s): "AMMONIA" in the last 168 hours.  ABG No results found for: "PHART", "PCO2ART", "PO2ART", "HCO3", "TCO2", "ACIDBASEDEF", "O2SAT"   Coagulation Profile: Recent Labs  Lab 12/23/21 2023  INR 1.0    Cardiac Enzymes: No results for input(s): "CKTOTAL", "CKMB", "CKMBINDEX",  "TROPONINI" in the last 168 hours.  HbA1C: Hgb A1c MFr Bld  Date/Time Value Ref Range Status  12/20/2021 12:10 PM 7.4 (H) 4.8 - 5.6 % Final    Comment:    (NOTE) Pre diabetes:          5.7%-6.4%  Diabetes:              >6.4%  Glycemic control for   <7.0% adults with diabetes     CBG: Recent Labs  Lab 12/23/21 1137 12/23/21 1613 12/23/21 2115 12/24/21 0509 12/24/21 0932  GLUCAP 180* 175* 188* 164* 139*    Review of Systems:   Unable to obtain  Past Medical History:  He,  has a past medical history of Arthritis, Depression, Diabetes mellitus without complication (HCC), Dyspnea, Hypertension, Nocturia, and PONV (postoperative nausea and vomiting).   Surgical History:   Past Surgical History:  Procedure Laterality Date   COLONOSCOPY     KNEE SURGERY Bilateral    Right, Left x2   LEFT HEART CATH AND CORONARY ANGIOGRAPHY N/A 12/20/2021   Procedure: LEFT HEART CATH AND CORONARY ANGIOGRAPHY;  Surgeon: Lyn Records, MD;  Location: [facility] INVASIVE CV LAB;  Service: Cardiovascular;  Laterality: N/A;   TOTAL HIP ARTHROPLASTY Right 03/03/2016   Procedure: TOTAL HIP ARTHROPLASTY ANTERIOR APPROACH;  Surgeon: Samson Frederic, MD;  Location: [facility] OR;  Service: Orthopedics;  Laterality: Right;   WRIST SURGERY Right 2008 or 2009     Social History:   reports that he has quit smoking. His smoking use included cigarettes. He has never used smokeless tobacco. He reports that he does not drink alcohol and does not use drugs.   Family History:  His family history includes Cancer in his unknown relative; Diabetes in his unknown relative; Heart disease in his unknown relative.   Allergies Allergies  Allergen Reactions   Percocet [Oxycodone-Acetaminophen] Nausea And Vomiting   Crestor [Rosuvastatin]     Other reaction(s): myalgia/arthralgia (06/2020)     Home Medications  Prior to Admission medications   Medication Sig Start Date End Date Taking? Authorizing Provider  amLODipine  (NORVASC) 5 MG tablet Take 5 mg by mouth daily. 01/31/16  Yes [provider]  aspirin EC 81 MG tablet Take 1 tablet (81 mg total) by mouth daily. Swallow whole. 12/10/21  Yes Jodelle Red, MD  atorvastatin (LIPITOR) 10 MG tablet Take 1 tablet (10 mg total) by mouth daily. 12/10/21 12/05/22 Yes Jodelle Red, MD  metFORMIN (GLUCOPHAGE-XR) 500 MG 24 hr tablet Take 500 mg by mouth daily. 09/23/21  Yes [provider]  metoprolol succinate (TOPROL XL) 25 MG 24 hr tablet Take 1 tablet (25 mg total) by mouth daily. 12/10/21  Yes Jodelle Red, MD  nitroGLYCERIN (NITROSTAT) 0.4 MG SL tablet Place 1 tablet (0.4 mg total) under the tongue every 5 (five) minutes as needed for chest pain. 12/10/21 03/10/22 Yes Jodelle Red, MD  OVER THE COUNTER MEDICATION  Take 1 capsule by mouth daily. Maka Root   Yes [provider]  telmisartan (MICARDIS) 80 MG tablet Take 80 mg by mouth daily.   Yes [provider]     Critical care time: 35 minutes    CRITICAL CARE Performed by: Darcella Gasman Grettel Rames   Total critical care time: 35 minutes  Critical care time was exclusive of separately billable procedures and treating other patients.  Critical care was necessary to treat or prevent imminent or life-threatening deterioration.  Critical care was time spent personally by me on the following activities: development of treatment plan with patient and/or surrogate as well as nursing, discussions with consultants, evaluation of patient's response to treatment, examination of patient, obtaining history from patient or surrogate, ordering and performing treatments and interventions, ordering and review of laboratory studies, ordering and review of radiographic studies, pulse oximetry and re-evaluation of patient's condition.   Darcella Gasman Stephani Janak, PA-C [facility] See Amion for pager If no response to pager , please call [phone] until  7pm After 7:00 pm call Elink  [phone]

## 2021-12-25 ENCOUNTER — Inpatient Hospital Stay (HOSPITAL_COMMUNITY): Payer: BC Managed Care – PPO

## 2021-12-25 ENCOUNTER — Encounter (HOSPITAL_COMMUNITY): Payer: Self-pay | Admitting: Thoracic Surgery (Cardiothoracic Vascular Surgery)

## 2021-12-25 DIAGNOSIS — J9601 Acute respiratory failure with hypoxia: Secondary | ICD-10-CM

## 2021-12-25 DIAGNOSIS — R739 Hyperglycemia, unspecified: Secondary | ICD-10-CM

## 2021-12-25 DIAGNOSIS — Z951 Presence of aortocoronary bypass graft: Secondary | ICD-10-CM

## 2021-12-25 DIAGNOSIS — I200 Unstable angina: Secondary | ICD-10-CM | POA: Diagnosis not present

## 2021-12-25 LAB — POCT I-STAT 7, (LYTES, BLD GAS, ICA,H+H)
Acid-base deficit: 7 mmol/L — ABNORMAL HIGH (ref 0.0–2.0)
Acid-base deficit: 9 mmol/L — ABNORMAL HIGH (ref 0.0–2.0)
Acid-base deficit: 8 mmol/L — ABNORMAL HIGH (ref 0.0–2.0)
Acid-base deficit: 7 mmol/L — ABNORMAL HIGH (ref 0.0–2.0)
Bicarbonate: 18.4 mmol/L — ABNORMAL LOW (ref 20.0–28.0)
Bicarbonate: 17.2 mmol/L — ABNORMAL LOW (ref 20.0–28.0)
Bicarbonate: 17.9 mmol/L — ABNORMAL LOW (ref 20.0–28.0)
Bicarbonate: 18.6 mmol/L — ABNORMAL LOW (ref 20.0–28.0)
Calcium, Ion: 1.16 mmol/L (ref 1.15–1.40)
Calcium, Ion: 1.14 mmol/L — ABNORMAL LOW (ref 1.15–1.40)
Calcium, Ion: 1.13 mmol/L — ABNORMAL LOW (ref 1.15–1.40)
Calcium, Ion: 1.18 mmol/L (ref 1.15–1.40)
HCT: 36 % — ABNORMAL LOW (ref 39.0–52.0)
HCT: 34 % — ABNORMAL LOW (ref 39.0–52.0)
HCT: 34 % — ABNORMAL LOW (ref 39.0–52.0)
HCT: 36 % — ABNORMAL LOW (ref 39.0–52.0)
Hemoglobin: 12.2 g/dL — ABNORMAL LOW (ref 13.0–17.0)
Hemoglobin: 11.6 g/dL — ABNORMAL LOW (ref 13.0–17.0)
Hemoglobin: 11.6 g/dL — ABNORMAL LOW (ref 13.0–17.0)
Hemoglobin: 12.2 g/dL — ABNORMAL LOW (ref 13.0–17.0)
O2 Saturation: 91 %
O2 Saturation: 93 %
O2 Saturation: 97 %
O2 Saturation: 97 %
Patient temperature: 38
Patient temperature: 38.8
Patient temperature: 37.4
Patient temperature: 98.7
Potassium: 4.3 mmol/L (ref 3.5–5.1)
Potassium: 4.1 mmol/L (ref 3.5–5.1)
Potassium: 4 mmol/L (ref 3.5–5.1)
Potassium: 4.5 mmol/L (ref 3.5–5.1)
Sodium: 138 mmol/L (ref 135–145)
Sodium: 139 mmol/L (ref 135–145)
Sodium: 140 mmol/L (ref 135–145)
Sodium: 136 mmol/L (ref 135–145)
TCO2: 19 mmol/L — ABNORMAL LOW (ref 22–32)
TCO2: 18 mmol/L — ABNORMAL LOW (ref 22–32)
TCO2: 19 mmol/L — ABNORMAL LOW (ref 22–32)
TCO2: 20 mmol/L — ABNORMAL LOW (ref 22–32)
pCO2 arterial: 37.3 mmHg (ref 32–48)
pCO2 arterial: 40.1 mmHg (ref 32–48)
pCO2 arterial: 36.1 mmHg (ref 32–48)
pCO2 arterial: 35.3 mmHg (ref 32–48)
pH, Arterial: 7.306 — ABNORMAL LOW (ref 7.35–7.45)
pH, Arterial: 7.251 — ABNORMAL LOW (ref 7.35–7.45)
pH, Arterial: 7.305 — ABNORMAL LOW (ref 7.35–7.45)
pH, Arterial: 7.329 — ABNORMAL LOW (ref 7.35–7.45)
pO2, Arterial: 71 mmHg — ABNORMAL LOW (ref 83–108)
pO2, Arterial: 85 mmHg (ref 83–108)
pO2, Arterial: 98 mmHg (ref 83–108)
pO2, Arterial: 97 mmHg (ref 83–108)

## 2021-12-25 LAB — GLUCOSE, CAPILLARY
Glucose-Capillary: 105 mg/dL — ABNORMAL HIGH (ref 70–99)
Glucose-Capillary: 111 mg/dL — ABNORMAL HIGH (ref 70–99)
Glucose-Capillary: 137 mg/dL — ABNORMAL HIGH (ref 70–99)
Glucose-Capillary: 141 mg/dL — ABNORMAL HIGH (ref 70–99)
Glucose-Capillary: 149 mg/dL — ABNORMAL HIGH (ref 70–99)
Glucose-Capillary: 154 mg/dL — ABNORMAL HIGH (ref 70–99)
Glucose-Capillary: 160 mg/dL — ABNORMAL HIGH (ref 70–99)
Glucose-Capillary: 160 mg/dL — ABNORMAL HIGH (ref 70–99)
Glucose-Capillary: 163 mg/dL — ABNORMAL HIGH (ref 70–99)
Glucose-Capillary: 164 mg/dL — ABNORMAL HIGH (ref 70–99)
Glucose-Capillary: 167 mg/dL — ABNORMAL HIGH (ref 70–99)
Glucose-Capillary: 172 mg/dL — ABNORMAL HIGH (ref 70–99)
Glucose-Capillary: 178 mg/dL — ABNORMAL HIGH (ref 70–99)
Glucose-Capillary: 201 mg/dL — ABNORMAL HIGH (ref 70–99)

## 2021-12-25 LAB — MRSA NEXT GEN BY PCR, NASAL: MRSA by PCR Next Gen: NOT DETECTED

## 2021-12-25 LAB — BASIC METABOLIC PANEL
Anion gap: 10 (ref 5–15)
Anion gap: 11 (ref 5–15)
Anion gap: 7 (ref 5–15)
BUN: 15 mg/dL (ref 6–20)
BUN: 17 mg/dL (ref 6–20)
BUN: 23 mg/dL — ABNORMAL HIGH (ref 6–20)
CO2: 19 mmol/L — ABNORMAL LOW (ref 22–32)
CO2: 19 mmol/L — ABNORMAL LOW (ref 22–32)
CO2: 19 mmol/L — ABNORMAL LOW (ref 22–32)
Calcium: 8.1 mg/dL — ABNORMAL LOW (ref 8.9–10.3)
Calcium: 8.1 mg/dL — ABNORMAL LOW (ref 8.9–10.3)
Calcium: 8.1 mg/dL — ABNORMAL LOW (ref 8.9–10.3)
Chloride: 106 mmol/L (ref 98–111)
Chloride: 107 mmol/L (ref 98–111)
Chloride: 110 mmol/L (ref 98–111)
Creatinine, Ser: 1.01 mg/dL (ref 0.61–1.24)
Creatinine, Ser: 1.12 mg/dL (ref 0.61–1.24)
Creatinine, Ser: 1.39 mg/dL — ABNORMAL HIGH (ref 0.61–1.24)
GFR, Estimated: 58 mL/min — ABNORMAL LOW (ref >60)
GFR, Estimated: >60 mL/min (ref >60)
GFR, Estimated: >60 mL/min (ref >60)
Glucose, Bld: 144 mg/dL — ABNORMAL HIGH (ref 70–99)
Glucose, Bld: 156 mg/dL — ABNORMAL HIGH (ref 70–99)
Glucose, Bld: 211 mg/dL — ABNORMAL HIGH (ref 70–99)
Potassium: 4.4 mmol/L (ref 3.5–5.1)
Potassium: 4.7 mmol/L (ref 3.5–5.1)
Potassium: 4.8 mmol/L (ref 3.5–5.1)
Sodium: 135 mmol/L (ref 135–145)
Sodium: 136 mmol/L (ref 135–145)
Sodium: 137 mmol/L (ref 135–145)

## 2021-12-25 LAB — CBC
HCT: 37.3 % — ABNORMAL LOW (ref 39.0–52.0)
HCT: 37.9 % — ABNORMAL LOW (ref 39.0–52.0)
HCT: 39.5 % (ref 39.0–52.0)
Hemoglobin: 12.2 g/dL — ABNORMAL LOW (ref 13.0–17.0)
Hemoglobin: 13 g/dL (ref 13.0–17.0)
Hemoglobin: 13.7 g/dL (ref 13.0–17.0)
MCH: 27 pg (ref 26.0–34.0)
MCH: 27.3 pg (ref 26.0–34.0)
MCH: 27.4 pg (ref 26.0–34.0)
MCHC: 32.7 g/dL (ref 30.0–36.0)
MCHC: 34.3 g/dL (ref 30.0–36.0)
MCHC: 34.7 g/dL (ref 30.0–36.0)
MCV: 79 fL — ABNORMAL LOW (ref 80.0–100.0)
MCV: 79.5 fL — ABNORMAL LOW (ref 80.0–100.0)
MCV: 82.5 fL (ref 80.0–100.0)
Platelets: 132 10*3/uL — ABNORMAL LOW (ref 150–400)
Platelets: 164 10*3/uL (ref 150–400)
Platelets: 169 10*3/uL (ref 150–400)
RBC: 4.52 MIL/uL (ref 4.22–5.81)
RBC: 4.77 MIL/uL (ref 4.22–5.81)
RBC: 5 MIL/uL (ref 4.22–5.81)
RDW: 13.6 % (ref 11.5–15.5)
RDW: 13.6 % (ref 11.5–15.5)
RDW: 14.4 % (ref 11.5–15.5)
WBC: 12.2 10*3/uL — ABNORMAL HIGH (ref 4.0–10.5)
WBC: 15.2 10*3/uL — ABNORMAL HIGH (ref 4.0–10.5)
WBC: 17.5 10*3/uL — ABNORMAL HIGH (ref 4.0–10.5)
nRBC: 0 % (ref 0.0–0.2)
nRBC: 0 % (ref 0.0–0.2)
nRBC: 0 % (ref 0.0–0.2)

## 2021-12-25 LAB — TYPE AND SCREEN
ABO/RH(D): A POS
Antibody Screen: NEGATIVE
Unit division: 0
Unit division: 0

## 2021-12-25 LAB — BPAM RBC
Blood Product Expiration Date: 202309252359
Blood Product Expiration Date: 202309272359
Unit Type and Rh: 6200
Unit Type and Rh: 6200

## 2021-12-25 LAB — MAGNESIUM
Magnesium: 2.2 mg/dL (ref 1.7–2.4)
Magnesium: 2.6 mg/dL — ABNORMAL HIGH (ref 1.7–2.4)
Magnesium: 2.9 mg/dL — ABNORMAL HIGH (ref 1.7–2.4)

## 2021-12-25 LAB — PROCALCITONIN: Procalcitonin: 0.88 ng/mL

## 2021-12-25 MED ORDER — FUROSEMIDE 10 MG/ML IJ SOLN
20.0000 mg | Freq: Once | INTRAMUSCULAR | Status: AC
  Administered 2021-12-25: 20 mg via INTRAVENOUS
  Filled 2021-12-25: qty 2

## 2021-12-25 MED ORDER — ENOXAPARIN SODIUM 40 MG/0.4ML IJ SOSY
40.0000 mg | PREFILLED_SYRINGE | Freq: Every day | INTRAMUSCULAR | Status: DC
  Administered 2021-12-25 – 2021-12-29 (×5): 40 mg via SUBCUTANEOUS
  Filled 2021-12-25 (×5): qty 0.4

## 2021-12-25 MED ORDER — AMLODIPINE BESYLATE 5 MG PO TABS
5.0000 mg | ORAL_TABLET | Freq: Every day | ORAL | Status: DC
  Administered 2021-12-25 – 2021-12-27 (×3): 5 mg via ORAL
  Filled 2021-12-25 (×3): qty 1

## 2021-12-25 MED ORDER — IPRATROPIUM-ALBUTEROL 0.5-2.5 (3) MG/3ML IN SOLN
3.0000 mL | Freq: Four times a day (QID) | RESPIRATORY_TRACT | Status: DC
Start: 2021-12-25 — End: 2021-12-25
  Filled 2021-12-25: qty 3

## 2021-12-25 MED ORDER — AMIODARONE HCL 200 MG PO TABS
200.0000 mg | ORAL_TABLET | Freq: Every day | ORAL | Status: DC
  Administered 2021-12-25: 200 mg via ORAL
  Filled 2021-12-25: qty 1

## 2021-12-25 MED ORDER — INSULIN DETEMIR 100 UNIT/ML ~~LOC~~ SOLN
10.0000 [IU] | Freq: Every day | SUBCUTANEOUS | Status: DC
  Administered 2021-12-25 – 2021-12-27 (×3): 10 [IU] via SUBCUTANEOUS
  Filled 2021-12-25 (×4): qty 0.1

## 2021-12-25 MED ORDER — SODIUM BICARBONATE 8.4 % IV SOLN
25.0000 meq | Freq: Once | INTRAVENOUS | Status: AC
  Administered 2021-12-25: 25 meq via INTRAVENOUS
  Filled 2021-12-25: qty 50

## 2021-12-25 MED ORDER — INSULIN ASPART 100 UNIT/ML IJ SOLN
0.0000 [IU] | INTRAMUSCULAR | Status: DC
  Administered 2021-12-25: 8 [IU] via SUBCUTANEOUS
  Administered 2021-12-25 (×2): 2 [IU] via SUBCUTANEOUS
  Administered 2021-12-25: 4 [IU] via SUBCUTANEOUS
  Administered 2021-12-26: 2 [IU] via SUBCUTANEOUS
  Administered 2021-12-26: 4 [IU] via SUBCUTANEOUS
  Administered 2021-12-26 – 2021-12-28 (×8): 2 [IU] via SUBCUTANEOUS

## 2021-12-25 MED ORDER — CHLORHEXIDINE GLUCONATE CLOTH 2 % EX PADS
6.0000 | MEDICATED_PAD | Freq: Every day | CUTANEOUS | Status: DC
  Administered 2021-12-25 – 2021-12-30 (×6): 6 via TOPICAL

## 2021-12-25 MED ORDER — METHOCARBAMOL 500 MG PO TABS
500.0000 mg | ORAL_TABLET | Freq: Three times a day (TID) | ORAL | Status: DC | PRN
  Administered 2021-12-25 – 2021-12-27 (×4): 500 mg via ORAL
  Filled 2021-12-25 (×4): qty 1

## 2021-12-25 MED ORDER — SODIUM CHLORIDE 0.9 % IV SOLN
2.0000 g | Freq: Three times a day (TID) | INTRAVENOUS | Status: DC
  Administered 2021-12-25 – 2021-12-27 (×6): 2 g via INTRAVENOUS
  Filled 2021-12-25 (×6): qty 12.5

## 2021-12-25 MED ORDER — INSULIN DETEMIR 100 UNIT/ML ~~LOC~~ SOLN: 10.0000 [IU] | Freq: Every day | SUBCUTANEOUS | Status: DC

## 2021-12-25 MED ORDER — SODIUM BICARBONATE 650 MG PO TABS
650.0000 mg | ORAL_TABLET | Freq: Three times a day (TID) | ORAL | Status: DC
  Administered 2021-12-25 (×2): 650 mg via ORAL
  Filled 2021-12-25 (×2): qty 1

## 2021-12-25 MED ORDER — IPRATROPIUM-ALBUTEROL 0.5-2.5 (3) MG/3ML IN SOLN
3.0000 mL | Freq: Four times a day (QID) | RESPIRATORY_TRACT | Status: DC | PRN
Start: 2021-12-25 — End: 2021-12-30

## 2021-12-25 MED ORDER — FUROSEMIDE 10 MG/ML IJ SOLN
40.0000 mg | Freq: Once | INTRAMUSCULAR | Status: AC
  Administered 2021-12-25: 40 mg via INTRAVENOUS
  Filled 2021-12-25: qty 4

## 2021-12-25 MED ORDER — LIDOCAINE 5 % EX PTCH
1.0000 | MEDICATED_PATCH | CUTANEOUS | Status: DC
  Administered 2021-12-25 – 2021-12-28 (×4): 1 via TRANSDERMAL
  Filled 2021-12-25 (×5): qty 1

## 2021-12-25 NOTE — Progress Notes (Signed)
Rapid wean initiated per protocol  

## 2021-12-25 NOTE — Procedures (Signed)
Extubation Procedure Note  Patient Details:   Name: Jonathan Riley DOB: Sep 17, 1960 MRN: [id number]   Airway Documentation:    Vent end date: 12/25/21 Vent end time: 0210   Evaluation  O2 sats: stable throughout Complications: No apparent complications Patient did tolerate procedure well. Bilateral Breath Sounds: Diminished   Yes  Pt extubated to [facility] per protocol. NIF -40 VC: 1L IS: . Positive cuff leak. No stridor noted. Pt able to say his name.   Berton Bon 12/25/2021, 2:17 AM

## 2021-12-25 NOTE — Progress Notes (Signed)
NAME:  Jonathan Riley, MRN:  [id number], DOB:  May 25, 1960, LOS: 5 ADMISSION DATE:  12/20/2021, CONSULTATION DATE: 12/25/21 REFERRING MD:  Cliffton Asters, CHIEF COMPLAINT:   CABG  History of Present Illness:  Jonathan Riley is a 61 y.o. M with PMH of HTN, type 2 DM, HL, tobacco use, multivessal CAD  who underwent CABG 9/5 and was transferred to the ICU post-procedure.   Jonathan Riley was initially evaluated by cardiology in August after Jonathan Riley noted worsening dyspnea on exertion.   A coronary CTA showed moderate stenosis of the LAD. Lcx, OM2 and RCA, LHC 9/1 with severe circumflex disease and high grade mid RCA stenosis.   Echo with preserved EF.  Jonathan Riley was admitted for [facility] with showed severe multi-vessel disease and then scheduled for 5 vessel CABG which Jonathan Riley underwent 9/5.  Jonathan Riley was transferred to the ICU intubated and PCCM consulted.   Pertinent  Medical History   has a past medical history of Arthritis, Depression, Diabetes mellitus without complication (HCC), Dyspnea, Hypertension, Nocturia, and PONV (postoperative nausea and vomiting).  Significant Hospital Events: Including procedures, antibiotic start and stop dates in addition to other pertinent events   9/5 CABG, intubated, admit to ICU 9/6 extubated overnight, some desaturations so placed on bipap  Interim History / Subjective:  Rapid wean and extubated overnight, hypoxic on [place] so transitioned to Bipap Remains on Cardene, Neosynephrine , amiodarone and low dose precedex Complains of chest discomfort near mediastinal tubes CXR with increased opacities   Objective   Blood pressure (!) 116/50, pulse 74, temperature 99.4 F (37.4 C), temperature source Oral, resp. rate 19, height 5\' 10"  (1.778 m), weight 115 kg, SpO2 93 %. CVP:  [6 mmHg-27 mmHg] 12 mmHg  Vent Mode: BIPAP FiO2 (%):  [40 %-80 %] 80 % Set Rate:  [4 bmp-14 bmp] 14 bmp Vt Set:  [580 mL] 580 mL PEEP:  [5 cmH20-8 cmH20] 8 cmH20 Pressure Support:  [10 cmH20] 10 cmH20 Plateau Pressure:  [16  cmH20] 16 cmH20   Intake/Output Summary (Last 24 hours) at 12/25/2021 0743 Last data filed at 12/25/2021 0700 Gross per 24 hour  Intake 8537.76 ml  Output 3851 ml  Net 4686.76 ml    Filed Weights   12/24/21 0325 12/24/21 0954 12/25/21 0500  Weight: 111 kg 110.7 kg 115 kg     General:  ill-appearing and uncomfortable M, restless on bipap HEENT: MM pink/moist, sclera anicteric, tolerating Bipap mask Neuro: awake, alert, following commands CV: s1s2 , no m/r/g PULM:  mildly decreased air entry bilateral bases, no ronchi or wheezing on Bipap 14/8, 80% GI: soft, mildly distended Extremities: warm/dry, no edema  Skin: no rashes or lesions   Resolved Hospital Problem list     Assessment & Plan:    Multi-vessel coronary artery disease s/p 5 vessel CABG Post-op Ventilator management  HL HTN -extubated overnight to bipap, sats improved this AM, trial HFNC -CXR with increased opacity, WBC increased and fever to 101F, received one dose Vancomycin and post-op Ancef, monitor fever curve and respiratory status closely.  Check procalcitonin and consider developing pneumonia -post-op management per TCTS, flowtrac measurements are stable, on Asa, statin, metoprolol -prn morphine and tramadol for pain -Nicardipine transition to amlodipine  -precedex low dose, was anxious on Bipap, d/c as able if tolerates HFNC -renal function is stable     Type 2 DM -transition to SSI    Post op atrial fibrillation -rate controlled with amiodarone, transitioned to po -No AC 2/2 post-op bleeding risk   Best  Practice (right click and "Reselect all SmartList Selections" daily)  Per primary  Labs   CBC: Recent Labs  Lab 12/23/21 0229 12/24/21 0314 12/24/21 1243 12/24/21 1600 12/24/21 1620 12/24/21 1835 12/24/21 2335 12/25/21 0202 12/25/21 0327 12/25/21 0426 12/25/21 0507  WBC 7.2 7.5  --   --   --  12.5* 15.2*  --   --   --  17.5*  HGB 15.1 15.4   < > 11.9*   < > 12.8* 13.7 12.2* 11.6*  11.6* 13.0  HCT 43.4 44.1   < > 35.4*   < > 38.7* 39.5 36.0* 34.0* 34.0* 37.9*  MCV 78.6* 78.1*  --   --   --  81.1 79.0*  --   --   --  79.5*  PLT 161 152  --  185  --  134* 169  --   --   --  164   < > = values in this interval not displayed.     Basic Metabolic Panel: Recent Labs  Lab 12/22/21 0727 12/23/21 0229 12/24/21 0314 12/24/21 1243 12/24/21 1523 12/24/21 1549 12/24/21 1624 12/24/21 1649 12/24/21 1724 12/24/21 1834 12/24/21 2335 12/25/21 0202 12/25/21 0327 12/25/21 0426 12/25/21 0507  NA 139 138 139   < > 136   < > 134*   < > 136   < > 137 138 139 140 136  K 4.5 3.9 4.0   < > 5.3*   < > 5.8*   < > 5.2*   < > 4.7 4.3 4.1 4.0 4.8  CL 108 106 105   < > 104  --  103  --  105  --  107  --   --   --  110  CO2 23 24 21*  --   --   --   --   --   --   --  19*  --   --   --  19*  GLUCOSE 140* 188* 166*   < > 117*  --  153*  --  151*  --  156*  --   --   --  144*  BUN 10 12 11    < > 14  --  15  --  15  --  15  --   --   --  17  CREATININE 0.86 1.00 0.89   < > 0.80  --  0.80  --  0.80  --  1.01  --   --   --  1.12  CALCIUM 9.2 9.3 9.5  --   --   --   --   --   --   --  8.1*  --   --   --  8.1*  MG  --   --   --   --   --   --   --   --   --   --  2.9*  --   --   --  2.6*   < > = values in this interval not displayed.    GFR: Estimated Creatinine Clearance: 89.1 mL/min (by C-G formula based on SCr of 1.12 mg/dL). Recent Labs  Lab 12/24/21 0314 12/24/21 1835 12/24/21 2335 12/25/21 0507  WBC 7.5 12.5* 15.2* 17.5*     Liver Function Tests: No results for input(s): "AST", "ALT", "ALKPHOS", "BILITOT", "PROT", "ALBUMIN" in the last 168 hours. No results for input(s): "LIPASE", "AMYLASE" in the last 168 hours. No results for input(s): "AMMONIA" in the last 168 hours.  ABG    Component Value Date/Time   PHART 7.305 (L) 12/25/2021 0426   PCO2ART 36.1 12/25/2021 0426   PO2ART 98 12/25/2021 0426   HCO3 17.9 (L) 12/25/2021 0426   TCO2 19 (L) 12/25/2021 0426    ACIDBASEDEF 8.0 (H) 12/25/2021 0426   O2SAT 97 12/25/2021 0426     Coagulation Profile: Recent Labs  Lab 12/23/21 2023 12/24/21 1835  INR 1.0 1.4*     Cardiac Enzymes: No results for input(s): "CKTOTAL", "CKMB", "CKMBINDEX", "TROPONINI" in the last 168 hours.  HbA1C: Hgb A1c MFr Bld  Date/Time Value Ref Range Status  12/20/2021 12:10 PM 7.4 (H) 4.8 - 5.6 % Final    Comment:    (NOTE) Pre diabetes:          5.7%-6.4%  Diabetes:              >6.4%  Glycemic control for   <7.0% adults with diabetes     CBG: Recent Labs  Lab 12/25/21 0236 12/25/21 0344 12/25/21 0425 12/25/21 0633 12/25/21 0709  GLUCAP 163* 141* 105* 164* 160*     Review of Systems:   Unable to obtain  Past Medical History:  Jonathan Riley,  has a past medical history of Arthritis, Depression, Diabetes mellitus without complication ([place]), Dyspnea, Hypertension, Nocturia, and PONV (postoperative nausea and vomiting).   Surgical History:   Past Surgical History:  Procedure Laterality Date   COLONOSCOPY     KNEE SURGERY Bilateral    Right, Left x2   LEFT HEART CATH AND CORONARY ANGIOGRAPHY N/A 12/20/2021   Procedure: LEFT HEART CATH AND CORONARY ANGIOGRAPHY;  Surgeon: Belva Crome, MD;  Location: [place] [facility];  Service: Cardiovascular;  Laterality: N/A;   TOTAL HIP ARTHROPLASTY Right 03/03/2016   Procedure: TOTAL HIP ARTHROPLASTY ANTERIOR APPROACH;  Surgeon: Rod Can, MD;  Location: [facility];  Service: Orthopedics;  Laterality: Right;   WRIST SURGERY Right 2008 or 2009     Social History:   reports that Jonathan Riley has quit smoking. His smoking use included cigarettes. Jonathan Riley has never used smokeless tobacco. Jonathan Riley reports that Jonathan Riley does not drink alcohol and does not use drugs.   Family History:  His family history includes Cancer in his unknown relative; Diabetes in his unknown relative; Heart disease in his unknown relative.   Allergies Allergies  Allergen Reactions   Percocet [Oxycodone-Acetaminophen]  Nausea And Vomiting   Crestor [Rosuvastatin]     Other reaction(s): myalgia/arthralgia (06/2020)     Home Medications  Prior to Admission medications   Medication Sig Start Date End Date Taking? Authorizing Provider  amLODipine (NORVASC) 5 MG tablet Take 5 mg by mouth daily. 01/31/16  Yes [provider]  aspirin EC 81 MG tablet Take 1 tablet (81 mg total) by mouth daily. Swallow whole. 12/10/21  Yes Buford Dresser, MD  atorvastatin (LIPITOR) 10 MG tablet Take 1 tablet (10 mg total) by mouth daily. 12/10/21 12/05/22 Yes Buford Dresser, MD  metFORMIN (GLUCOPHAGE-XR) 500 MG 24 hr tablet Take 500 mg by mouth daily. 09/23/21  Yes [provider]  metoprolol succinate (TOPROL XL) 25 MG 24 hr tablet Take 1 tablet (25 mg total) by mouth daily. 12/10/21  Yes Buford Dresser, MD  nitroGLYCERIN (NITROSTAT) 0.4 MG SL tablet Place 1 tablet (0.4 mg total) under the tongue every 5 (five) minutes as needed for chest pain. 12/10/21 03/10/22 Yes Buford Dresser, MD  OVER THE COUNTER MEDICATION Take 1 capsule by mouth daily. Maka Root   Yes [provider]  telmisartan (MICARDIS) 80 MG tablet Take 80 mg by mouth daily.   Yes [provider]     Critical care time: 32 minutes    CRITICAL CARE Performed by: Otilio Carpen Jezebel Pollet   Total critical care time: 32 minutes  Critical care time was exclusive of separately billable procedures and treating other patients.  Critical care was necessary to treat or prevent imminent or life-threatening deterioration.  Critical care was time spent personally by me on the following activities: development of treatment plan with patient and/or surrogate as well as nursing, discussions with consultants, evaluation of patient's response to treatment, examination of patient, obtaining history from patient or surrogate, ordering and performing treatments and interventions, ordering and review of laboratory studies, ordering  and review of radiographic studies, pulse oximetry and re-evaluation of patient's condition.   Otilio Carpen Thelbert Gartin, PA-C [facility] See Amion for pager If no response to pager , please call [phone] until 7pm After 7:00 pm call Elink  [phone]?[place]

## 2021-12-25 NOTE — Progress Notes (Addendum)
TCTS DAILY ICU PROGRESS NOTE                   [address]            [place] [address]          [phone]   1 Day Post-Op Procedure(s) (LRB): CORONARY ARTERY BYPASS GRAFTING (CABG) X 5 BYPASSES USING OPEN LEFT INTERNAL MAMMARY ARTERY, OPEN LEFT RADIAL ARTERY, AND OPEN RIGHT GREATER SAPHENOUS VEIN HARVEST. (N/A) TRANSESOPHAGEAL ECHOCARDIOGRAM (TEE) (N/A)  Total Length of Stay:  LOS: 5 days   Subjective: Sore but mostly feels ok, some anxiety  Objective: Vital signs in last 24 hours: Temp:  [98.6 F (37 C)-101.8 F (38.8 C)] 99.4 F (37.4 C) (09/06 0600) Pulse Rate:  [55-81] 74 (09/06 0700) Cardiac Rhythm: Normal sinus rhythm (09/05 1940) Resp:  [12-30] 19 (09/06 0700) BP: (106-155)/(50-87) 116/50 (09/05 2226) SpO2:  [88 %-100 %] 93 % (09/06 0700) Arterial Line BP: (54-143)/(41-66) 132/49 (09/06 0700) FiO2 (%):  [40 %-80 %] 80 % (09/06 0326) Weight:  [110.7 kg-115 kg] 115 kg (09/06 0500)  Filed Weights   12/24/21 0325 12/24/21 0954 12/25/21 0500  Weight: 111 kg 110.7 kg 115 kg    Weight change: -0.318 kg   Hemodynamic parameters for last 24 hours: CVP:  [6 mmHg-27 mmHg] 12 mmHg  Intake/Output from previous day: 09/05 0701 - 09/06 0700 In: 8537.8 [I.V.:6190.6; Blood:813; IV Piggyback:1534.2] Out: 0347 [Urine:2251; Blood:1250; Chest Tube:350]  Intake/Output this shift: No intake/output data recorded.  Current Meds: Scheduled Meds:  acetaminophen  1,000 mg Oral Q6H   Or   acetaminophen (TYLENOL) oral liquid 160 mg/5 mL  1,000 mg Per Tube Q6H   amLODipine  2.5 mg Oral Daily   aspirin EC  325 mg Oral Daily   Or   aspirin  324 mg Per Tube Daily   atorvastatin  80 mg Oral Daily   bisacodyl  10 mg Oral Daily   Or   bisacodyl  10 mg Rectal Daily   Chlorhexidine Gluconate Cloth  6 each Topical Daily   docusate sodium  200 mg Oral Daily   metoprolol tartrate  12.5 mg Oral BID   Or   metoprolol tartrate  12.5 mg Per Tube BID   mupirocin  ointment  1 Application Nasal BID   [START ON 12/26/2021] pantoprazole  40 mg Oral Daily   sodium chloride flush  3 mL Intravenous Q12H   Continuous Infusions:  sodium chloride 20 mL/hr at 12/25/21 0700   sodium chloride     sodium chloride     albumin human 60 mL/hr at 12/25/21 0700   amiodarone 30 mg/hr (12/25/21 0700)    ceFAZolin (ANCEF) IV Stopped (12/25/21 4259)   dexmedetomidine (PRECEDEX) IV infusion 0.2 mcg/kg/hr (12/25/21 0700)   insulin 5 Units/hr (12/24/21 1820)   lactated ringers     lactated ringers 20 mL/hr at 12/25/21 0700   lactated ringers 20 mL/hr at 12/25/21 0700   niCARDipine 3 mg/hr (12/25/21 0700)   phenylephrine (NEO-SYNEPHRINE) Adult infusion 20 mcg/min (12/25/21 0700)   PRN Meds:.sodium chloride, albumin human, dextrose, lactated ringers, melatonin, metoprolol tartrate, midazolam, morphine injection, ondansetron (ZOFRAN) IV, sodium chloride flush, traMADol  General appearance: alert, cooperative, and no distress Heart: regular rate and rhythm Lungs: clear anteriorly Abdomen: soft, non tender Extremities: minor periph edema Wound: incis dressed- left hand N/V intact  Lab Results: CBC: Recent Labs    12/24/21 2335 12/25/21 0202 12/25/21 0426 12/25/21 0507  WBC 15.2*  --   --  17.5*  HGB 13.7   < > 11.6* 13.0  HCT 39.5   < > 34.0* 37.9*  PLT 169  --   --  164   < > = values in this interval not displayed.   BMET:  Recent Labs    12/24/21 2335 12/25/21 0202 12/25/21 0426 12/25/21 0507  NA 137   < > 140 136  K 4.7   < > 4.0 4.8  CL 107  --   --  110  CO2 19*  --   --  19*  GLUCOSE 156*  --   --  144*  BUN 15  --   --  17  CREATININE 1.01  --   --  1.12  CALCIUM 8.1*  --   --  8.1*   < > = values in this interval not displayed.    CMET: Lab Results  Component Value Date   WBC 17.5 (H) 12/25/2021   HGB 13.0 12/25/2021   HCT 37.9 (L) 12/25/2021   PLT 164 12/25/2021   GLUCOSE 144 (H) 12/25/2021   CHOL 196 12/10/2021   TRIG 246 (H)  12/10/2021   HDL 30 (L) 12/10/2021   LDLCALC 122 (H) 12/10/2021   ALT 29 04/14/2021   AST 21 04/14/2021   NA 136 12/25/2021   K 4.8 12/25/2021   CL 110 12/25/2021   CREATININE 1.12 12/25/2021   BUN 17 12/25/2021   CO2 19 (L) 12/25/2021   TSH 0.663 05/07/2017   INR 1.4 (H) 12/24/2021   HGBA1C 7.4 (H) 12/20/2021      PT/INR:  Recent Labs    12/24/21 1835  LABPROT 16.7*  INR 1.4*   Radiology: DG Chest Port 1 View  Result Date: 12/24/2021 CLINICAL DATA:  Pneumothorax EXAM: PORTABLE CHEST 1 VIEW COMPARISON:  Portable exam 1841 hours compared to 12/23/2021 FINDINGS: Tip of endotracheal tube projects 3.6 cm above carina. Nasogastric tube extends into stomach. New RIGHT jugular central venous catheter with tip projecting over SVC. Mediastinal drain and LEFT thoracostomy tube present. Normal heart size post interval median sternotomy. LEFT basilar atelectasis. Lungs otherwise clear. No infiltrate, pleural effusion, or pneumothorax. IMPRESSION: Postoperative changes of cardiac surgery. No pneumothorax. LEFT basilar atelectasis. Electronically Signed   By: Ulyses Southward M.D.   On: 12/24/2021 18:51     Assessment/Plan: S/P Procedure(s) (LRB): CORONARY ARTERY BYPASS GRAFTING (CABG) X 5 BYPASSES USING OPEN LEFT INTERNAL MAMMARY ARTERY, OPEN LEFT RADIAL ARTERY, AND OPEN RIGHT GREATER SAPHENOUS VEIN HARVEST. (N/A) TRANSESOPHAGEAL ECHOCARDIOGRAM (TEE) (N/A) POD#1  1 Tmax 101.8, sBP 106-155 range, CVP 12, sinus rhythm- on amio, and cardene and precedex, hemodynamics on flowtrac are good 2 RR12-30, placed on Bipap for decreasing sats, now stable in low to mid 90's 3 weight  about 5 kg >preop, good UOP, normal BUN/Creat- will need some diuresis but spontaneous urine is good currently 4 CT 350 ml 5 ABG shows a metabolic acidosis with significant base defecit 6 leukocytosis with WBC risibg trend to 17.5 7 BS  control is reasonable, was on metformin at home 8 very minor ABLA- expected 9 CXR- poor  quality, may have some left base effus/atx 10 EKG some  ant/lat ST elev 11 will d/w MD    Rowe Clack 12/25/2021 7:13 AM   Agree with above Giving 1amp of bicarb Transition to amlodpine and po amio Will keep a line and tubes for now Ambulation SSI  Jefferey Lippmann O Toniqua Melamed

## 2021-12-25 NOTE — Progress Notes (Signed)
EKG CRITICAL VALUE     12 lead EKG performed.  Critical value noted.  Raynelle Fanning, RN notified.   Orma Flaming, CCT 12/25/2021 8:20 AM

## 2021-12-25 NOTE — Progress Notes (Signed)
Patient taken off BiPAP at this time and placed on HHFNC. 30L/100%. Tolerating well at this time. RN at bedside.

## 2021-12-25 NOTE — Progress Notes (Signed)
Pt's placed on [facility] after extubation. Sp02 dropping shortly after that. pt placed on 10L Venti mask. 02 increased to 92%.  Pt with increased WOB, low 02 despite 10L.  Pt placed on documented Bipap settings at this time. Pt tolerating well at this time. Sp02 96%

## 2021-12-25 NOTE — Progress Notes (Addendum)
CT surgery PM rounds  Patient transitioned off BiPAP earlier this day postop day 1 CABG. Has required high flow oxygen remained saturations 89 to 90%.  Patient has been receiving DuoNeb treatments. On exam this evening lungs are wet and we will redose Lasix, last dose 6 hours ago Blood pressure stable, sinus tachycardia Pain control has been an issue, Robaxin and Lidoderm patch have been previously added to his analgesia regimen   Blood pressure (!) 127/52, pulse 80, temperature 98.7 F (37.1 C), temperature source Oral, resp. rate 19, height 5\' 10"  (1.778 m), weight 115 kg, SpO2 91 %.

## 2021-12-25 NOTE — Progress Notes (Signed)
Pharmacy Antibiotic Note  Jonathan Riley is a 61 y.o. male admitted on 12/20/2021 with pneumonia.  Pharmacy has been consulted for Cefepime dosing. CrCl > 60 ml/min.  Plan: D/c cefazolin surgical prophylaxis Start Cefepime 2 gms IV q8hr Monitor renal function, clinical status and C&S  Height: 5\' 10"  (177.8 cm) Weight: 115 kg (253 lb 8.5 oz) IBW/kg (Calculated) : 73  Temp (24hrs), Avg:100 F (37.8 C), Min:98.7 F (37.1 C), Max:101.8 F (38.8 C)  Recent Labs  Lab 12/23/21 0229 12/24/21 0314 12/24/21 1243 12/24/21 1523 12/24/21 1624 12/24/21 1724 12/24/21 1835 12/24/21 2335 12/25/21 0507  WBC 7.2 7.5  --   --   --   --  12.5* 15.2* 17.5*  CREATININE 1.00 0.89   < > 0.80 0.80 0.80  --  1.01 1.12   < > = values in this interval not displayed.    Estimated Creatinine Clearance: 89.1 mL/min (by C-G formula based on SCr of 1.12 mg/dL).    Allergies  Allergen Reactions   Percocet [Oxycodone-Acetaminophen] Nausea And Vomiting   Crestor [Rosuvastatin]     Other reaction(s): myalgia/arthralgia (06/2020)    Antimicrobials this admission: Cefepime 9/6 >>   Thank you for allowing pharmacy to be a part of this patient's care.  11/6, PharmD, [facility] Clinical Pharmacist Please see AMION for all Pharmacists' Contact Phone Numbers 12/25/2021, 3:39 PM

## 2021-12-26 ENCOUNTER — Inpatient Hospital Stay (HOSPITAL_COMMUNITY): Payer: BC Managed Care – PPO

## 2021-12-26 ENCOUNTER — Ambulatory Visit (HOSPITAL_BASED_OUTPATIENT_CLINIC_OR_DEPARTMENT_OTHER): Payer: BC Managed Care – PPO | Admitting: Cardiology

## 2021-12-26 LAB — BASIC METABOLIC PANEL
Anion gap: 7 (ref 5–15)
BUN: 26 mg/dL — ABNORMAL HIGH (ref 6–20)
CO2: 22 mmol/L (ref 22–32)
Calcium: 7.9 mg/dL — ABNORMAL LOW (ref 8.9–10.3)
Chloride: 107 mmol/L (ref 98–111)
Creatinine, Ser: 1.18 mg/dL (ref 0.61–1.24)
GFR, Estimated: >60 mL/min (ref >60)
Glucose, Bld: 155 mg/dL — ABNORMAL HIGH (ref 70–99)
Potassium: 4.3 mmol/L (ref 3.5–5.1)
Sodium: 136 mmol/L (ref 135–145)

## 2021-12-26 LAB — GLUCOSE, CAPILLARY
Glucose-Capillary: 102 mg/dL — ABNORMAL HIGH (ref 70–99)
Glucose-Capillary: 121 mg/dL — ABNORMAL HIGH (ref 70–99)
Glucose-Capillary: 134 mg/dL — ABNORMAL HIGH (ref 70–99)
Glucose-Capillary: 144 mg/dL — ABNORMAL HIGH (ref 70–99)
Glucose-Capillary: 147 mg/dL — ABNORMAL HIGH (ref 70–99)
Glucose-Capillary: 186 mg/dL — ABNORMAL HIGH (ref 70–99)

## 2021-12-26 LAB — CBC
HCT: 35 % — ABNORMAL LOW (ref 39.0–52.0)
Hemoglobin: 11.6 g/dL — ABNORMAL LOW (ref 13.0–17.0)
MCH: 27.2 pg (ref 26.0–34.0)
MCHC: 33.1 g/dL (ref 30.0–36.0)
MCV: 82 fL (ref 80.0–100.0)
Platelets: 116 10*3/uL — ABNORMAL LOW (ref 150–400)
RBC: 4.27 MIL/uL (ref 4.22–5.81)
RDW: 14.7 % (ref 11.5–15.5)
WBC: 10.5 10*3/uL (ref 4.0–10.5)
nRBC: 0 % (ref 0.0–0.2)

## 2021-12-26 LAB — PROCALCITONIN: Procalcitonin: 1.18 ng/mL

## 2021-12-26 LAB — ECHO INTRAOPERATIVE TEE
Height: 70 in
Weight: 3904 oz

## 2021-12-26 MED ORDER — ORAL CARE MOUTH RINSE: 15.0000 mL | OROMUCOSAL | Status: DC | PRN

## 2021-12-26 MED ORDER — FUROSEMIDE 10 MG/ML IJ SOLN
40.0000 mg | Freq: Once | INTRAMUSCULAR | Status: AC
Start: 2021-12-26 — End: 2021-12-26
  Administered 2021-12-26: 40 mg via INTRAVENOUS
  Filled 2021-12-26: qty 4

## 2021-12-26 MED ORDER — METOPROLOL TARTRATE 25 MG PO TABS
25.0000 mg | ORAL_TABLET | Freq: Two times a day (BID) | ORAL | Status: DC
  Administered 2021-12-26 – 2021-12-30 (×9): 25 mg via ORAL
  Filled 2021-12-26 (×9): qty 1

## 2021-12-26 MED ORDER — POLYETHYLENE GLYCOL 3350 17 G PO PACK
17.0000 g | PACK | Freq: Every day | ORAL | Status: DC
  Administered 2021-12-26 – 2021-12-28 (×3): 17 g via ORAL
  Filled 2021-12-26 (×3): qty 1

## 2021-12-26 MED ORDER — SENNA 8.6 MG PO TABS
1.0000 | ORAL_TABLET | Freq: Two times a day (BID) | ORAL | Status: DC
Start: 2021-12-26 — End: 2021-12-30
  Administered 2021-12-26 – 2021-12-29 (×7): 8.6 mg via ORAL
  Filled 2021-12-26 (×7): qty 1

## 2021-12-26 MED ORDER — FUROSEMIDE 10 MG/ML IJ SOLN
40.0000 mg | Freq: Once | INTRAMUSCULAR | Status: AC
  Administered 2021-12-26: 40 mg via INTRAVENOUS
  Filled 2021-12-26: qty 4

## 2021-12-26 MED ORDER — SODIUM CHLORIDE 0.9 % IV SOLN
12.5000 mg | Freq: Once | INTRAVENOUS | Status: AC
  Administered 2021-12-26: 12.5 mg via INTRAVENOUS
  Filled 2021-12-26 (×2): qty 0.5

## 2021-12-26 MED ORDER — PROMETHAZINE HCL 25 MG/ML IJ SOLN
12.5000 mg | Freq: Four times a day (QID) | INTRAMUSCULAR | Status: DC | PRN
  Administered 2021-12-27 – 2021-12-30 (×2): 12.5 mg via INTRAVENOUS
  Filled 2021-12-26 (×2): qty 0.5

## 2021-12-26 NOTE — Progress Notes (Signed)
[address]       [place] [address]             [phone]                 2 Days Post-Op Procedure(s) (LRB): CORONARY ARTERY BYPASS GRAFTING (CABG) X 5 BYPASSES USING OPEN LEFT INTERNAL MAMMARY ARTERY, OPEN LEFT RADIAL ARTERY, AND OPEN RIGHT GREATER SAPHENOUS VEIN HARVEST. (N/A) TRANSESOPHAGEAL ECHOCARDIOGRAM (TEE) (N/A)   Events: No events _______________________________________________________________ Vitals: BP (!) 127/52   Pulse 78   Temp 98.4 F (36.9 C) (Oral)   Resp 16   Ht 5\' 10"  (1.778 m)   Wt 116.8 kg   SpO2 95%   BMI 36.95 kg/m  Filed Weights   12/24/21 0954 12/25/21 0500 12/26/21 0500  Weight: 110.7 kg 115 kg 116.8 kg     - Neuro: alert NAD  - Cardiovascular: sinus  Drips: none.   CVP:  [5 mmHg-36 mmHg] 5 mmHg  - Pulm: EWOB FiO2 (%):  [40 %-100 %] 40 %  ABG    Component Value Date/Time   PHART 7.329 (L) 12/25/2021 1401   PCO2ART 35.3 12/25/2021 1401   PO2ART 97 12/25/2021 1401   HCO3 18.6 (L) 12/25/2021 1401   TCO2 20 (L) 12/25/2021 1401   ACIDBASEDEF 7.0 (H) 12/25/2021 1401   O2SAT 97 12/25/2021 1401    - Abd: ND - Extremity: warm  .Intake/Output      09/06 0701 09/07 0700 09/07 0701 09/08 0700   P.O. 80    I.V. (mL/kg) 1514.1 (13)    Blood     IV Piggyback 301.1    Total Intake(mL/kg) 1895.2 (16.2)    Urine (mL/kg/hr) 1260 (0.4)    Emesis/NG output 0    Stool 0    Blood     Chest Tube 30    Total Output 1290    Net +605.2            _______________________________________________________________ Labs:    Latest Ref Rng & Units 12/26/2021    3:07 AM 12/25/2021    4:33 PM 12/25/2021    2:01 PM  CBC  WBC 4.0 - 10.5 K/uL 10.5  12.2    Hemoglobin 13.0 - 17.0 g/dL 02/24/2022  40.3  47.4   Hematocrit 39.0 - 52.0 % 35.0  37.3  36.0   Platelets 150 - 400 K/uL 116  132        Latest Ref Rng & Units 12/26/2021    3:07 AM 12/25/2021    4:33 PM 12/25/2021    2:01 PM  CMP  Glucose 70 - 99 mg/dL 02/24/2022  563    BUN 6  - 20 mg/dL 26  23    Creatinine 875 - 1.24 mg/dL 6.43  3.29    Sodium 5.18 - 145 mmol/L 136  135  136   Potassium 3.5 - 5.1 mmol/L 4.3  4.4  4.5   Chloride 98 - 111 mmol/L 107  106    CO2 22 - 32 mmol/L 22  19    Calcium 8.9 - 10.3 mg/dL 7.9  8.1      CXR: PV congestion  _______________________________________________________________  Assessment and Plan: POD 2 s/p CABG5  Neuro: poor pain control.  Adjusting meds CV: will remove wires and a-line.  Increasing BB.  Will d/c amio Pulm: IS, ambulation, diuresis Renal: creat stable.  Will diurese GI: on diet Heme: stsable ID: on cefepime.  Will follow WBC and pulm status  Endo: SSI Dispo: continue ICU care   Jonathan Riley O Jonathan Riley 12/26/2021 9:30 AM

## 2021-12-26 NOTE — Progress Notes (Signed)
NAME:  COURAGE BIGLOW, MRN:  [id number], DOB:  1961-03-28, LOS: 6 ADMISSION DATE:  12/20/2021, CONSULTATION DATE: 12/26/21 REFERRING MD:  Cliffton Asters, CHIEF COMPLAINT:   CABG  History of Present Illness:  Husayn Reim is a 61 y.o. M with PMH of HTN, type 2 DM, HL, tobacco use, multivessal CAD  who underwent CABG 9/5 and was transferred to the ICU post-procedure.   He was initially evaluated by cardiology in August after he noted worsening dyspnea on exertion.   A coronary CTA showed moderate stenosis of the LAD. Lcx, OM2 and RCA, LHC 9/1 with severe circumflex disease and high grade mid RCA stenosis.   Echo with preserved EF.  He was admitted for [facility] with showed severe multi-vessel disease and then scheduled for 5 vessel CABG which he underwent 9/5.  He was transferred to the ICU intubated and PCCM consulted.   Pertinent  Medical History   has a past medical history of Arthritis, Depression, Diabetes mellitus without complication (HCC), Dyspnea, Hypertension, Nocturia, and PONV (postoperative nausea and vomiting).  Significant Hospital Events: Including procedures, antibiotic start and stop dates in addition to other pertinent events   9/5 CABG, intubated, admit to ICU 9/6 extubated overnight, some desaturations so placed on bipap 9/7 chest tubes, pacing wires, Aline removed  Interim History / Subjective:  Mr. Sabino Gasser continues to complain of uncontrolled pain.  He reports a history of pain in his left-sided.  No nausea, but he has had a poor appetite and minimal p.o. intake.   Objective   Blood pressure (!) 127/52, pulse 88, temperature 98.5 F (36.9 C), temperature source Axillary, resp. rate 20, height 5\' 10"  (1.778 m), weight 116.8 kg, SpO2 91 %. CVP:  [5 mmHg-36 mmHg] 6 mmHg  Vent Mode: BIPAP FiO2 (%):  [50 %-100 %] 50 % Set Rate:  [14 bmp] 14 bmp PEEP:  [8 cmH20] 8 cmH20   Intake/Output Summary (Last 24 hours) at 12/26/2021 0752 Last data filed at 12/26/2021 0600 Gross per 24 hour   Intake 1895.15 ml  Output 1290 ml  Net 605.15 ml    Filed Weights   12/24/21 0954 12/25/21 0500 12/26/21 0500  Weight: 110.7 kg 115 kg 116.8 kg    General: Middle-age man lying in bed no acute distress HEENT: [place]/AT, eyes anicteric Neuro: Resting in bed but arouses easily to verbal stimulation.  Answering questions appropriately moving all extremities continuously CV: S1-S2, regular rate and rhythm PULM: Reduced bibasilar breath sounds.  No rhonchi or rales.  No observed coughing.  Pain limiting his ability to take deep breaths on command. GI: Soft, nondistended Extremities: Minimal pedal and hand edema Skin: No rashes or wounds   Resolved Hospital Problem list     Assessment & Plan:   Multi-vessel coronary artery disease s/p 5 vessel CABG HLD HTN -Postop care per cardiothoracic surgery.  Wires and chest tubes coming out today. -Aspirin, metoprolol, statin -Amlodipine for radial graft -Postop pain control-fentanyl, tramadol, oxycodone as needed -Out of bed mobility, progress as able - Continues to require close ICU monitoring due to tenuous respiratory status.  Acute respiratory failure with hypoxia-seems disproportionate but he is at increased risk due to prolonged surgical time with 5 vessel CABG.; slowly improving oxygen requirements. -Continue pulmonary hygiene - Additional Lasix today - Wean supplemental oxygen as able  Difficult to control pain -Adding Robaxin for pain control - Escalating bowel regimen--adding senna and MiraLAX  Acute metabolic acidosis-resolved - Discontinue enteral bicarb  Type 2 DM -No changes to insulin  regimen; continue sliding scale insulin plus detemir 10 units daily  Peri-op atrial fibrillation -Oral amiodarone - No anticoagulation at this point unless it recurs.   Best Practice (right click and "Reselect all SmartList Selections" daily)  Per primary  Labs   CBC: Recent Labs  Lab 12/24/21 1835 12/24/21 2335 12/25/21 0202  12/25/21 0426 12/25/21 0507 12/25/21 1401 12/25/21 1633 12/26/21 0307  WBC 12.5* 15.2*  --   --  17.5*  --  12.2* 10.5  HGB 12.8* 13.7   < > 11.6* 13.0 12.2* 12.2* 11.6*  HCT 38.7* 39.5   < > 34.0* 37.9* 36.0* 37.3* 35.0*  MCV 81.1 79.0*  --   --  79.5*  --  82.5 82.0  PLT 134* 169  --   --  164  --  132* 116*   < > = values in this interval not displayed.     Basic Metabolic Panel: Recent Labs  Lab 12/24/21 0314 12/24/21 1243 12/24/21 1724 12/24/21 1834 12/24/21 2335 12/25/21 0202 12/25/21 0426 12/25/21 0507 12/25/21 1401 12/25/21 1633 12/26/21 0307  NA 139   < > 136   < > 137   < > 140 136 136 135 136  K 4.0   < > 5.2*   < > 4.7   < > 4.0 4.8 4.5 4.4 4.3  CL 105   < > 105  --  107  --   --  110  --  106 107  CO2 21*  --   --   --  19*  --   --  19*  --  19* 22  GLUCOSE 166*   < > 151*  --  156*  --   --  144*  --  211* 155*  BUN 11   < > 15  --  15  --   --  17  --  23* 26*  CREATININE 0.89   < > 0.80  --  1.01  --   --  1.12  --  1.39* 1.18  CALCIUM 9.5  --   --   --  8.1*  --   --  8.1*  --  8.1* 7.9*  MG  --   --   --   --  2.9*  --   --  2.6*  --  2.2  --    < > = values in this interval not displayed.    GFR: Estimated Creatinine Clearance: 85.2 mL/min (by C-G formula based on SCr of 1.18 mg/dL). Recent Labs  Lab 12/24/21 2335 12/25/21 0507 12/25/21 1250 12/25/21 1633 12/26/21 0307  PROCALCITON  --   --  0.88  --  1.18  WBC 15.2* 17.5*  --  12.2* 10.5    This patient is critically ill with multiple organ system failure which requires frequent high complexity decision making, assessment, support, evaluation, and titration of therapies. This was completed through the application of advanced monitoring technologies and extensive interpretation of multiple databases. During this encounter critical care time was devoted to patient care services described in this note for 36 minutes.    Steffanie Dunn, DO 12/26/21 2:33 PM  Pulmonary & Critical  Care

## 2021-12-26 NOTE — Plan of Care (Signed)
  Problem: Activity: Goal: Ability to return to baseline activity level will improve Outcome: Progressing   Problem: Cardiovascular: Goal: Ability to achieve and maintain adequate cardiovascular perfusion will improve Outcome: Progressing   Problem: Clinical Measurements: Goal: Ability to maintain clinical measurements within normal limits will improve Outcome: Progressing Goal: Diagnostic test results will improve Outcome: Progressing Goal: Respiratory complications will improve Outcome: Progressing   Problem: Elimination: Goal: Will not experience complications related to urinary retention Outcome: Progressing   Problem: Pain Managment: Goal: General experience of comfort will improve Outcome: Progressing   Problem: Safety: Goal: Ability to remain free from injury will improve Outcome: Progressing   Problem: Skin Integrity: Goal: Risk for impaired skin integrity will decrease Outcome: Progressing

## 2021-12-26 NOTE — Anesthesia Postprocedure Evaluation (Signed)
Anesthesia Post Note  Patient: Jakeel A Polanco  Procedure(s) Performed: CORONARY ARTERY BYPASS GRAFTING (CABG) X 5 BYPASSES USING OPEN LEFT INTERNAL MAMMARY ARTERY, OPEN LEFT RADIAL ARTERY, AND OPEN RIGHT GREATER SAPHENOUS VEIN HARVEST. (Chest) TRANSESOPHAGEAL ECHOCARDIOGRAM (TEE) (Esophagus)     Patient location during evaluation: SICU Anesthesia Type: General Level of consciousness: patient remains intubated per anesthesia plan Pain management: pain level controlled Vital Signs Assessment: post-procedure vital signs reviewed and stable Respiratory status: patient remains intubated per anesthesia plan Cardiovascular status: stable Postop Assessment: no apparent nausea or vomiting Anesthetic complications: no   No notable events documented.  Last Vitals:  Vitals:   12/26/21 1626 12/26/21 1700  BP:  (!) 110/91  Pulse: 75 72  Resp: 16 20  Temp: 36.8 C   SpO2: 96% (!) 86%    Last Pain:  Vitals:   12/26/21 1800  TempSrc:   PainSc: 3                  Ishitha Roper

## 2021-12-26 NOTE — Progress Notes (Signed)
Pt is not wearing Bipap at this time, Vitals are stable, 96% spo2. RT will continue to monitor.

## 2021-12-26 NOTE — Progress Notes (Signed)
Epicardial Pacing Wires removed per order. No complications noted. Pt tolerated procedure well. Bed rest start at 0824 and ends at 0924.

## 2021-12-27 DIAGNOSIS — Z951 Presence of aortocoronary bypass graft: Secondary | ICD-10-CM

## 2021-12-27 LAB — GLUCOSE, CAPILLARY
Glucose-Capillary: 120 mg/dL — ABNORMAL HIGH (ref 70–99)
Glucose-Capillary: 129 mg/dL — ABNORMAL HIGH (ref 70–99)
Glucose-Capillary: 128 mg/dL — ABNORMAL HIGH (ref 70–99)
Glucose-Capillary: 119 mg/dL — ABNORMAL HIGH (ref 70–99)
Glucose-Capillary: 122 mg/dL — ABNORMAL HIGH (ref 70–99)
Glucose-Capillary: 132 mg/dL — ABNORMAL HIGH (ref 70–99)

## 2021-12-27 LAB — PROCALCITONIN: Procalcitonin: 1.04 ng/mL

## 2021-12-27 MED ORDER — FUROSEMIDE 10 MG/ML IJ SOLN
40.0000 mg | Freq: Once | INTRAMUSCULAR | Status: AC
  Administered 2021-12-27: 40 mg via INTRAVENOUS
  Filled 2021-12-27: qty 4

## 2021-12-27 MED ORDER — METOCLOPRAMIDE HCL 5 MG/ML IJ SOLN
5.0000 mg | Freq: Three times a day (TID) | INTRAMUSCULAR | Status: AC
  Administered 2021-12-27 – 2021-12-29 (×6): 5 mg via INTRAVENOUS
  Filled 2021-12-27 (×6): qty 2

## 2021-12-27 NOTE — Progress Notes (Signed)
      [address]       [place] [address]             [phone]                 3 Days Post-Op Procedure(s) (LRB): CORONARY ARTERY BYPASS GRAFTING (CABG) X 5 BYPASSES USING OPEN LEFT INTERNAL MAMMARY ARTERY, OPEN LEFT RADIAL ARTERY, AND OPEN RIGHT GREATER SAPHENOUS VEIN HARVEST. (N/A) TRANSESOPHAGEAL ECHOCARDIOGRAM (TEE) (N/A)   Events: No events _______________________________________________________________ Vitals: BP (!) 121/58   Pulse 74   Temp 98.8 F (37.1 C) (Oral)   Resp 20   Ht 5\' 10"  (1.778 m)   Wt 114.2 kg   SpO2 95%   BMI 36.12 kg/m  Filed Weights   12/25/21 0500 12/26/21 0500 12/27/21 0500  Weight: 115 kg 116.8 kg 114.2 kg     - Neuro: alert NAD  - Cardiovascular: sinus  Drips: none.      - Pulm: EWOB    ABG    Component Value Date/Time   PHART 7.329 (L) 12/25/2021 1401   PCO2ART 35.3 12/25/2021 1401   PO2ART 97 12/25/2021 1401   HCO3 18.6 (L) 12/25/2021 1401   TCO2 20 (L) 12/25/2021 1401   ACIDBASEDEF 7.0 (H) 12/25/2021 1401   O2SAT 97 12/25/2021 1401    - Abd: ND - Extremity: warm  .Intake/Output      09/07 0701 09/08 0700 09/08 0701 09/09 0700   P.O. 590 350   I.V. (mL/kg) 491 (4.3) 0 (0)   IV Piggyback 200.1 138.3   Total Intake(mL/kg) 1281.1 (11.2) 488.3 (4.3)   Urine (mL/kg/hr) 351 (0.1)    Emesis/NG output 0    Stool     Chest Tube 80    Total Output 431    Net +850.1 +488.3        Urine Occurrence 2 x 1 x   Emesis Occurrence 1 x       _______________________________________________________________ Labs:    Latest Ref Rng & Units 12/26/2021    3:07 AM 12/25/2021    4:33 PM 12/25/2021    2:01 PM  CBC  WBC 4.0 - 10.5 K/uL 10.5  12.2    Hemoglobin 13.0 - 17.0 g/dL 02/24/2022  24.5  80.9   Hematocrit 39.0 - 52.0 % 35.0  37.3  36.0   Platelets 150 - 400 K/uL 116  132        Latest Ref Rng & Units 12/26/2021    3:07 AM 12/25/2021    4:33 PM 12/25/2021    2:01 PM  CMP  Glucose 70 - 99 mg/dL 02/24/2022  382     BUN 6 - 20 mg/dL 26  23    Creatinine 505 - 1.24 mg/dL 3.97  6.73    Sodium 4.19 - 145 mmol/L 136  135  136   Potassium 3.5 - 5.1 mmol/L 4.3  4.4  4.5   Chloride 98 - 111 mmol/L 107  106    CO2 22 - 32 mmol/L 22  19    Calcium 8.9 - 10.3 mg/dL 7.9  8.1      CXR: -  _______________________________________________________________  Assessment and Plan: POD 3 s/p CABG5  Neuro: poor pain control.  Adjusting meds CV: A/S/BB Pulm: IS, ambulation, diuresis Renal: diuresing GI: on diet Heme: stsable ID: will dc abx Endo: SSI Dispo: continue ICU care   379 12/27/2021 12:45 PM

## 2021-12-27 NOTE — Progress Notes (Signed)
NAME:  Jonathan Riley, MRN:  [id number], DOB:  28-Dec-1960, LOS: 7 ADMISSION DATE:  12/20/2021, CONSULTATION DATE: 12/27/21 REFERRING MD:  Cliffton Asters, CHIEF COMPLAINT:   CABG  History of Present Illness:  Jonathan Riley is a 61 y.o. M with PMH of HTN, type 2 DM, HL, tobacco use, multivessal CAD  who underwent CABG 9/5 and was transferred to the ICU post-procedure.   He was initially evaluated by cardiology in August after he noted worsening dyspnea on exertion.   A coronary CTA showed moderate stenosis of the LAD. Lcx, OM2 and RCA, LHC 9/1 with severe circumflex disease and high grade mid RCA stenosis.   Echo with preserved EF.  He was admitted for [facility] with showed severe multi-vessel disease and then scheduled for 5 vessel CABG which he underwent 9/5.  He was transferred to the ICU intubated and PCCM consulted.   Pertinent  Medical History   has a past medical history of Arthritis, Depression, Diabetes mellitus without complication (HCC), Dyspnea, Hypertension, Nocturia, and PONV (postoperative nausea and vomiting).  Significant Hospital Events: Including procedures, antibiotic start and stop dates in addition to other pertinent events   9/5 CABG, intubated, admit to ICU 9/6 extubated overnight, some desaturations so placed on bipap 9/7 pacing wires, Aline removed  Interim History / Subjective:  Overnight still having significant pain.  Had some nausea that improved with Zofran.  Objective   Blood pressure 123/85, pulse 80, temperature 98.1 F (36.7 C), temperature source Oral, resp. rate 17, height 5\' 10"  (1.778 m), weight 114.2 kg, SpO2 93 %. CVP:  [0 mmHg-20 mmHg] 20 mmHg  FiO2 (%):  [40 %-50 %] 50 %   Intake/Output Summary (Last 24 hours) at 12/27/2021 0713 Last data filed at 12/27/2021 0600 Gross per 24 hour  Intake 1281.05 ml  Output 431 ml  Net 850.05 ml    Filed Weights   12/25/21 0500 12/26/21 0500 12/27/21 0500  Weight: 115 kg 116.8 kg 114.2 kg    General: Middle-age man  sitting up in the recliner no acute distress HEENT: [place]/AT, eyes anicteric Neuro: Awake, alert.  Moving all extremities, but overall not moving much.  Answer questions appropriately CV: S1-S2, regular rate and rhythm PULM: Reduced bibasilar breath sounds, no observed coughing.  No wheezing or rhonchi. GI: Soft, nontender Extremities: Mild edema Skin: Warm, dry, no rashes.  Dressing over sternum intact.  PCT 1.04  Resolved Hospital Problem list     Assessment & Plan:   Multi-vessel coronary artery disease s/p 5 vessel CABG HLD HTN -Aspirin, statin, metoprolol. -Continue amlodipine 5 mg - Postop pain control-Fentanyl, Toradol, oxycodone as needed.  Lidoderm patch, Robaxin as needed.  Could consider adding gabapentin if needed. - Continue progressing out of bed mobility. - Wean supplemental oxygen as able.  Acute respiratory failure with hypoxia-seems disproportionate but he is at increased risk due to prolonged surgical time with 5 vessel CABG.; slowly improving oxygen requirements. - Continue pulmonary hygiene - Continuing supplemental oxygen - Lasix 40 mg today. - Okay to DC cefepime due to low concern for pneumonia, more likely he had atelectasis  Type 2 DM --Insulin detemir 10 units daily - Sliding scale insulin  Peri-op atrial fibrillation; not recurrent --previously on amiodarone post-op - No anticoagulation unless recurrent A-fib   Best Practice (right click and "Reselect all SmartList Selections" daily)  Per primary  Labs   CBC: Recent Labs  Lab 12/24/21 1835 12/24/21 2335 12/25/21 0202 12/25/21 02/24/22 12/25/21 0507 12/25/21 1401 12/25/21 1633 12/26/21 02/25/22  WBC 12.5* 15.2*  --   --  17.5*  --  12.2* 10.5  HGB 12.8* 13.7   < > 11.6* 13.0 12.2* 12.2* 11.6*  HCT 38.7* 39.5   < > 34.0* 37.9* 36.0* 37.3* 35.0*  MCV 81.1 79.0*  --   --  79.5*  --  82.5 82.0  PLT 134* 169  --   --  164  --  132* 116*   < > = values in this interval not displayed.     Basic  Metabolic Panel: Recent Labs  Lab 12/24/21 0314 12/24/21 1243 12/24/21 1724 12/24/21 1834 12/24/21 2335 12/25/21 0202 12/25/21 0426 12/25/21 0507 12/25/21 1401 12/25/21 1633 12/26/21 0307  NA 139   < > 136   < > 137   < > 140 136 136 135 136  K 4.0   < > 5.2*   < > 4.7   < > 4.0 4.8 4.5 4.4 4.3  CL 105   < > 105  --  107  --   --  110  --  106 107  CO2 21*  --   --   --  19*  --   --  19*  --  19* 22  GLUCOSE 166*   < > 151*  --  156*  --   --  144*  --  211* 155*  BUN 11   < > 15  --  15  --   --  17  --  23* 26*  CREATININE 0.89   < > 0.80  --  1.01  --   --  1.12  --  1.39* 1.18  CALCIUM 9.5  --   --   --  8.1*  --   --  8.1*  --  8.1* 7.9*  MG  --   --   --   --  2.9*  --   --  2.6*  --  2.2  --    < > = values in this interval not displayed.    GFR: Estimated Creatinine Clearance: 84.3 mL/min (by C-G formula based on SCr of 1.18 mg/dL). Recent Labs  Lab 12/24/21 2335 12/25/21 0507 12/25/21 1250 12/25/21 1633 12/26/21 0307 12/27/21 0414  PROCALCITON  --   --  0.88  --  1.18 1.04  WBC 15.2* 17.5*  --  12.2* 10.5  --        Steffanie Dunn, DO 12/27/21 11:25 AM [facility]

## 2021-12-27 NOTE — Progress Notes (Signed)
      [address]       [place] [address]             [phone]      POD # 3 CABG  Up in chair  BP (!) 123/97   Pulse 84   Temp 99 F (37.2 C) (Oral)   Resp 18   Ht 5\' 10"  (1.778 m)   Wt 114.2 kg   SpO2 92%   BMI 36.12 kg/m   5L HFNC 90% sat  CBG well controlled  Continue current care Recheck CXR in AM  [place] C. [place], MD [facility] [phone]

## 2021-12-27 NOTE — Discharge Summary (Signed)
[address]       [place] [address]             [phone]    Physician Discharge Summary  Patient ID: Jonathan Riley MRN: [id number] DOB/AGE: 61-02-1961 61 y.o.  Admit date: 12/20/2021 Discharge date: 12/27/2021  Admission Diagnoses:  Patient Active Problem List   Diagnosis Date Noted   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   Coronary artery disease 12/24/2021   Type 2 diabetes mellitus without complication, without long-term current use of insulin (HCC)    Unstable angina (HCC) 12/20/2021   CAD (coronary artery disease) 12/20/2021   CAD (coronary artery disease), native coronary artery 12/20/2021   Coronary artery calcification seen on CT scan 11/14/2021   Family history of heart disease 11/14/2021   Pure hypercholesterolemia 11/14/2021   Dyspnea 05/07/2017   Acute respiratory failure with hypoxia (HCC) 05/07/2017   Influenza A 05/07/2017   Essential hypertension 05/07/2017   Primary osteoarthritis of right hip 03/03/2016   Alteration of consciousness 02/21/2013     Discharge Diagnoses:  Patient Active Problem List   Diagnosis Date Noted   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   Coronary artery disease 12/24/2021   Type 2 diabetes mellitus without complication, without long-term current use of insulin (HCC)    Unstable angina (HCC) 12/20/2021   CAD (coronary artery disease) 12/20/2021   CAD (coronary artery disease), native coronary artery 12/20/2021   Coronary artery calcification seen on CT scan 11/14/2021   Family history of heart disease 11/14/2021   Pure hypercholesterolemia 11/14/2021   Dyspnea 05/07/2017   Acute respiratory failure with hypoxia (HCC) 05/07/2017   Influenza A 05/07/2017   Essential hypertension 05/07/2017   Primary osteoarthritis of right hip 03/03/2016   Alteration of consciousness 02/21/2013     Discharged Condition: {condition:18240}  History of Present Illness:     [name] is a 61 year old male with a past history of  hypertension, dyslipidemia, and type 2 diabetes mellitus who recently sought medical attention for evaluation of dyspnea on exertion.  He was seen by Dr. Janne Riley who recommended coronary CTA.  That study demonstrated  a coronary calcium score of 1633 which was in the 98th percentile for his demographic control.  Further evaluation with left heart catheterization was recommended and was carried out electively earlier today.  This demonstrates severe three-vessel coronary artery disease which is detailed in the report below.  In addition, the cardiology team felt there was possibly a left main coronary dissection that occurred on the initial injection of contrast. An echocardiogram obtained last month showed preserved left ventricular function with an ejection fraction of 60 to 65%. The LVEDP was 26.  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 Currently, Jonathan Riley is currently resting comfortably and is pain-free.  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 He is right handed. Cardiothoracic surgery was asked to consult on Jonathan Riley' case for possible surgical revascularization. Dr. Cliffton Riley reviewed the patient's chart, diagnostic studies and labs and it was thought that Jonathan Riley' best long term treatment option would be coronary artery bypass grafting. Dr. Cliffton Riley discussed his treatment options as well as the risks of surgery with him and his wife. After careful consideration, Jonathan Riley decided to move forward with coronary artery bypass grafting surgery.   Hospital Course: Jonathan Riley has been an inpatient at [facility]  since 12/20/21 when his cardiac catheterization was performed and discovered  left main coronary artery dissection. Jonathan Riley was brought back to the operating room on 12/24/2021 where he underwent a CABG x 5 utilizing LIMA to LAD, RADIAL ARTERY to PDA, SVG to OM1, SVG to  OM3, and SVG to DIAGONAL. He tolerated the procedure well and was transferred to the SICU in stable condition.  Postoperative hospital course:  The patient has remained hemodynamically stable.  Due to intraoperative fibrillation he was placed on amiodarone drip.  He has subsequently been chemically cardioverted to sinus rhythm.  He was weaned from the ventilator using standard protocols but early postextubation he had increasing oxygen needs and was placed on BiPAP.  He has subsequently been weaned to high flow nasal cannula on postoperative day #1.  Procalcitonin level is elevated and due to concerns for possible HCAP he has been placed on Maxipime.  Leukocytosis is improving over time.  Oxygen requirements are decreasing over time.  On postoperative day 2 epicardial pacing wires and arterial line were discontinued.  Amiodarone has also been discontinued.  Beta-blocker is being titrated.  Metabolic acidosis is showing improvement on oral bicarb.  Pulmonary critical care medicine is assisting with intensive care unit management.  Consults: {consultation:18241}  Significant Diagnostic Studies: {diagnostics:18242} ECHO INTRAOPERATIVE TEE  Result Date: 12/26/2021  *INTRAOPERATIVE TRANSESOPHAGEAL REPORT *  Patient Name:   Jonathan Riley Date of Exam: 12/24/2021 Medical Rec #:  [id number]       Height:       70.0 in Accession #:    [id number]      Weight:       244.7 lb Date of Birth:  1961/01/04      BSA:          2.27 m Patient Age:    60 years        BP:           137/77 mmHg Patient Gender: M               HR:           98 bpm. Exam Location:  Anesthesiology Transesophogeal exam was perform intraoperatively during surgical procedure. Patient was closely monitored under general anesthesia during the entirety of examination. Indications:     Coronary Artery Disease Sonographer:     Jonathan Riley RDCS Performing Phys: [id number] Jonathan Riley Jonathan Riley Diagnosing Phys: Jonathan Riley Jonathan Riley POST-OP IMPRESSIONS _ Left  Ventricle: Post Bypass: The patient came off bypass on the initial attempt. There did not appear to be any new findings from preop findings. Left ventricular contraction did improve with volume replacement. The TEE that had been placed after induction uneventfully was removed without difficulty. The patient was later taken to the SICU in stable conditon. PRE-OP FINDINGS  Left Ventricle: The left ventricle has normal systolic function, with an ejection fraction of 55-60%. Right Ventricle: The right ventricle has normal systolic function. Left Atrium: No left atrial/left atrial appendage thrombus was detected. Mitral Valve: The mitral valve is normal in structure. Mitral valve regurgitation is trivial by color flow Doppler. Tricuspid Valve: The tricuspid valve was normal in structure. Aortic Valve: The aortic valve is normal in structure. Pulmonic Valve: The pulmonic valve was normal in structure.   Jonathan Riley Jonathan Riley Electronically signed by Jonathan Riley Jonathan Riley Signature Date/Time: 12/26/2021/8:16:56 PM   Final    DG Chest Port 1 View  Result Date: 12/26/2021 CLINICAL DATA:  Chest pain, difficulty breathing EXAM: PORTABLE CHEST 1 VIEW  COMPARISON:  Previous studies including the examination of 12/25/2021 FINDINGS: Transverse diameter of heart is increased. There is increased density in left mid and left lower lung fields. Left lateral CP angle is indistinct. There is no pneumothorax. Tip of right IJ central venous catheter is seen in superior vena cava. No significant changes are noted in mediastinal drain and left chest tube. There is no pneumothorax. IMPRESSION: There is increased opacity in left mid and left lower lung fields suggesting increase in left pleural effusion and possibly worsening of underlying atelectasis/pneumonia. Electronically Signed   By: Ernie Avena M.D.   On: 12/26/2021 08:17   DG CHEST PORT 1 VIEW  Result Date: 12/25/2021 CLINICAL DATA:  Hypoxia, chest pain EXAM: PORTABLE CHEST 1 VIEW  COMPARISON:  Previous studies including the examination done earlier today FINDINGS: Transverse diameter of heart is increased. There are patchy linear densities in left lower lung fields suggesting atelectasis. There is blunting of left lateral CP angle. There are no signs of alveolar pulmonary edema. There is improvement in the aeration of right lower lung field. There is no pneumothorax. Tip of central venous catheter is seen in superior vena cava. Left chest tube is seen. There is possible catheter in the mediastinum. IMPRESSION: There are linear densities in left lower lung fields suggesting subsegmental atelectasis. Small left pleural effusion. Electronically Signed   By: Ernie Avena M.D.   On: 12/25/2021 12:23   DG Chest Port 1 View  Result Date: 12/25/2021 CLINICAL DATA:  61 year old male with history of cardiac surgery. EXAM: PORTABLE CHEST - 1 VIEW COMPARISON:  12/24/2021, 12/23/2021 FINDINGS: The mediastinal contours are unchanged. Unchanged cardiomegaly. Median sternotomy wires remain in place. Right internal jugular multi access catheter remains in place with the tip in the superior vena cava. Similar appearance and position of mediastinal and left basilar thoracostomy tubes. Epicardial pacing wires in place. Low lung volumes, worsened from comparison. Worsened left retrocardiac opacity, likely secondary to hypoinflation. No evidence of pneumothorax or new focal consolidation. No acute osseous abnormality. IMPRESSION: 1. Low lung volumes, worsened from comparison with slight interval increased retrocardiac opacities, likely atelectatic. 2. Stable support lines and tubes. Electronically Signed   By: Marliss Coots M.D.   On: 12/25/2021 08:27   DG Chest Port 1 View  Result Date: 12/24/2021 CLINICAL DATA:  Pneumothorax EXAM: PORTABLE CHEST 1 VIEW COMPARISON:  Portable exam 1841 hours compared to 12/23/2021 FINDINGS: Tip of endotracheal tube projects 3.6 cm above carina. Nasogastric tube  extends into stomach. New RIGHT jugular central venous catheter with tip projecting over SVC. Mediastinal drain and LEFT thoracostomy tube present. Normal heart size post interval median sternotomy. LEFT basilar atelectasis. Lungs otherwise clear. No infiltrate, pleural effusion, or pneumothorax. IMPRESSION: Postoperative changes of cardiac surgery. No pneumothorax. LEFT basilar atelectasis. Electronically Signed   By: Ulyses Southward M.D.   On: 12/24/2021 18:51   DG Chest Port 1 View  Result Date: 12/23/2021 CLINICAL DATA:  Preoperative EXAM: PORTABLE CHEST 1 VIEW COMPARISON:  04/14/2021 FINDINGS: Cardiomegaly. Both lungs are clear. The visualized skeletal structures are unremarkable. IMPRESSION: Cardiomegaly without acute abnormality of the lungs. Electronically Signed   By: Jearld Lesch M.D.   On: 12/23/2021 20:38   VAS US DOPPLER PRE CABG  Result Date: 12/21/2021 PREOPERATIVE VASCULAR EVALUATION Patient Name:  Jonathan Riley  Date of Exam:   12/21/2021 Medical Rec #: [id number]        Accession #:    [id number] Date of Birth: 01/01/1961  Patient Gender: M Patient Age:   20 years Exam Location:  [facility] Procedure:      VAS US DOPPLER PRE CABG Referring Phys: HARRELL Jonathan Riley --------------------------------------------------------------------------------  Indications:      Pre-CABG. Risk Factors:     Hypertension, hyperlipidemia, Diabetes, coronary artery                   disease. Comparison Study: No prior study on file Performing Technologist: Sharion Dove RVS  Examination Guidelines: A complete evaluation includes B-mode imaging, spectral Doppler, color Doppler, and power Doppler as needed of all accessible portions of each vessel. Bilateral testing is considered an integral part of a complete examination. Limited examinations for reoccurring indications may be performed as noted.  Right Carotid Findings: +----------+--------+--------+--------+------------+--------+           PSV  cm/sEDV cm/sStenosisDescribe    Comments +----------+--------+--------+--------+------------+--------+ CCA Prox  162     17                                   +----------+--------+--------+--------+------------+--------+ CCA Distal81      17                                   +----------+--------+--------+--------+------------+--------+ ICA Prox  143     27              heterogenous         +----------+--------+--------+--------+------------+--------+ ICA Mid   147     24                                   +----------+--------+--------+--------+------------+--------+ ICA Distal85      18                                   +----------+--------+--------+--------+------------+--------+ ECA       215     19                                   +----------+--------+--------+--------+------------+--------+ +----------+--------+-------+--------+------------+           PSV cm/sEDV cmsDescribeArm Pressure +----------+--------+-------+--------+------------+ Subclavian99                                  +----------+--------+-------+--------+------------+ +---------+--------+--+--------+--+ VertebralPSV cm/s60EDV cm/s17 +---------+--------+--+--------+--+ Left Carotid Findings: +----------+--------+--------+--------+-----------+--------+           PSV cm/sEDV cm/sStenosisDescribe   Comments +----------+--------+--------+--------+-----------+--------+ CCA Prox  138     24                                  +----------+--------+--------+--------+-----------+--------+ CCA Distal96      14              homogeneous         +----------+--------+--------+--------+-----------+--------+ ICA Prox  100     28              calcific            +----------+--------+--------+--------+-----------+--------+ ICA Mid   100  29                                  +----------+--------+--------+--------+-----------+--------+ ICA Distal75      17                                   +----------+--------+--------+--------+-----------+--------+ ECA       198     10                                  +----------+--------+--------+--------+-----------+--------+  +----------+--------+--------+--------+------------+ SubclavianPSV cm/sEDV cm/sDescribeArm Pressure +----------+--------+--------+--------+------------+           249                                  +----------+--------+--------+--------+------------+ +---------+--------+--+--------+--+ VertebralPSV cm/s59EDV cm/s17 +---------+--------+--+--------+--+  ABI Findings: +---------+------------------+-----+-----------+--------+ Right    Rt Pressure (mmHg)IndexWaveform   Comment  +---------+------------------+-----+-----------+--------+ Brachial 157                    triphasic           +---------+------------------+-----+-----------+--------+ PTA      207               1.32 multiphasic         +---------+------------------+-----+-----------+--------+ DP       181               1.15 multiphasic         +---------+------------------+-----+-----------+--------+ Great Toe148               0.94                     +---------+------------------+-----+-----------+--------+ +---------+------------------+-----+-----------+-------+ Left     Lt Pressure (mmHg)IndexWaveform   Comment +---------+------------------+-----+-----------+-------+ Brachial 155                    triphasic          +---------+------------------+-----+-----------+-------+ PTA      199               1.27 multiphasic        +---------+------------------+-----+-----------+-------+ DP       193               1.23 multiphasic        +---------+------------------+-----+-----------+-------+ Great Toe120               0.76                    +---------+------------------+-----+-----------+-------+ +-------+---------------+----------------+ ABI/TBIToday's ABI/TBIPrevious ABI/TBI  +-------+---------------+----------------+ Right  1.3/0.94                        +-------+---------------+----------------+ Left   1.27/0.76                       +-------+---------------+----------------+  Right Doppler Findings: +--------+--------+-----+---------+--------+ Site    PressureIndexDoppler  Comments +--------+--------+-----+---------+--------+ [id number]          triphasic         +--------+--------+-----+---------+--------+ Radial               triphasic         +--------+--------+-----+---------+--------+ Ulnar  triphasic         +--------+--------+-----+---------+--------+  Left Doppler Findings: +--------+--------+-----+---------+--------+ Site    PressureIndexDoppler  Comments +--------+--------+-----+---------+--------+ [id number]          triphasic         +--------+--------+-----+---------+--------+ Radial               triphasic         +--------+--------+-----+---------+--------+ Ulnar                triphasic         +--------+--------+-----+---------+--------+   Summary: Right Carotid: The extracranial vessels were near-normal with only minimal wall                thickening or plaque. Left Carotid: The extracranial vessels were near-normal with only minimal wall               thickening or plaque. Vertebrals:  Bilateral vertebral arteries demonstrate antegrade flow. Subclavians: Normal flow hemodynamics were seen in bilateral subclavian              arteries. Right ABI: Resting right ankle-brachial index is within normal range. The right toe-brachial index is normal. Left ABI: Resting left ankle-brachial index is within normal range. The left toe-brachial index is normal. Right Upper Extremity: Doppler waveforms remain within normal limits with right radial compression. Doppler waveforms remain within normal limits with right ulnar compression. Left Upper Extremity: Doppler waveforms remain within normal limits with  left radial compression. Doppler waveforms remain within normal limits with left ulnar compression.  Electronically signed by Monica Martinez Jonathan Riley on 12/21/2021 at 5:30:36 PM.    Final    CARDIAC CATHETERIZATION  Result Date: 12/20/2021 CONCLUSIONS: Left main trunk 25 to 30% stenosis and possibly catheter induced dissection/trauma at the time of first injection. Proximal to mid segmental 60 to 70% narrowing. Severely diseased circumflex with 70% first obtuse marginal 80% segmental second marginal and total occlusion of the distal circumflex. High-grade mid RCA stenosis, greater than 95% with TIMI-3 flow.  Proximal RCA and PDA 60 to 70% stenoses. Severe elevation in LVEDP, 26 mmHg.  Systolic function is normal. Recommendation: Observation overnight because of left main situation. Consult colleagues and also have TCT [place] to look at the patient's pictures to help develop a treatment plan.  Because of severity of RCA stenosis we need to revascularize in some form or fashion within the next several days to 1 week. Potentially, if left main is a ruptured plaque, we should consider coronary bypass grafting.  If not, consider PCI of RCA and medical treatment of the circumflex territory with clinical observation of left coronary and left main disease. In the meantime, we should treat diastolic heart failure aggressively.   CT CORONARY FRACTIONAL FLOW RESERVE DATA PREP  Result Date: 12/03/2021 EXAM: FFRCT ANALYSIS FINDINGS: FFRct analysis was performed on the original cardiac CT angiogram dataset. Diagrammatic representation of the FFRct analysis is provided in a separate PDF document in PACS. This dictation was created using the PDF document and an interactive 3D model of the results. 3D model is not available in the EMR/PACS. Normal FFR range is >0.80. 1. Left Main: 2. LAD: findings 0.81, 0.72, 0.69 very distal vessel D1 0.86 D2 0.79 3. LCX: findings 0.80, 0.58, CTO 4. Ramus: findings 0.90 5. RCA: findings 0.91, 0.76  IMPRESSION: FFR suggests distal LAD and distal RCA flow limiting but in very distal vessel; mid Lcx lesion flow limiting followed by CTO. Note: These examples are not recommendations of [employer] and  only provided as examples of what other customers are doing. Electronically Signed   By: Kirk Ruths M.D.   On: 12/03/2021 15:55   CT CORONARY MORPH W/CTA COR W/SCORE W/CA W/CM &/OR WO/CM  Addendum Date: 12/03/2021   ADDENDUM REPORT: 12/03/2021 15:19 EXAM: OVER-READ INTERPRETATION  CT CHEST The following report is an over-read performed by radiologist Dr. Estanislado Pandy [facility] on 12/03/2021. This over-read does not include interpretation of cardiac or coronary anatomy or pathology. The coronary CTA interpretation by the cardiologist is attached. COMPARISON:  CTA chest 05/06/2017 FINDINGS: Visualized mediastinal structures are normal. Diffuse low-density in the visualized liver is compatible with hepatic steatosis. No airspace disease or consolidation in the visualized lungs. No acute bone abnormality. IMPRESSION: No acute extracardiac findings. Hepatic steatosis. Electronically Signed   By: Markus Daft M.D.   On: 12/03/2021 15:19   Result Date: 12/03/2021 CLINICAL DATA:  60 yo male with DM and DOE EXAM: Cardiac/Coronary CTA TECHNIQUE: A non-contrast, gated CT scan was obtained with axial slices of 3 mm through the heart for calcium scoring. Calcium scoring was performed using the Agatston method. A 120 kV prospective, gated, contrast cardiac scan was obtained. Gantry rotation speed was 250 msecs and collimation was 0.6 mm. Two sublingual nitroglycerin tablets (0.8 mg) were given. The 3D data set was reconstructed in 5% intervals of the 35-75% of the R-R cycle. Diastolic phases were analyzed on a dedicated workstation using MPR, MIP, and VRT modes. The patient received 95 cc of contrast. FINDINGS: Image quality: Poor. Noise artifact is: Moderate. (signal-to-noise). Coronary Arteries:  Normal  coronary origin.  Right dominance. Left main: The left main is a large caliber vessel with a normal take off from the left coronary cusp that bifurcates to form a left anterior descending artery and a left circumflex artery. There is minimal (0-24) calcified plaque. Left anterior descending artery: The LAD is has mild (25-49) mixed plaque stenosis in the proximal vessel followed by moderate (50-69) mixed plaque stenosis in the mid vessel; there is mild (25-49) mixed plaque stenosis in the mid and distal vessel. The LAD gives off 2 diagonal branches; there is mild (25-49) calcified plaque in D1 and ostium of D2. Left circumflex artery: The LCX is non-dominant and there is minimal (0-24) calcified plaque in the proximal vessel and moderate (50-69) mixed plaque stenosis in the mid vessel; distal Lcx appears to be occluded. Small OM1 with mild (25-49)soft plaque stenosis in the proximal vessel. OM2 with mild (25-49) calcified stenosis in the proximal vessel and moderate (50-69) soft plaque stenosis in the mid vessel. OM3 not well visualized but there is possibly moderate (50-69) soft plaque stenosis in the distal vessel. Possible moderate stenosis in the left posterolateral. Right coronary artery: The RCA is dominant with normal take off from the right coronary cusp. There is moderate (50-69) mixed plaque stenosis in the proximal vessel; there is mild (25-49) soft plaque stenosis in the mid and distal vessel. The RCA terminates as a PDA; mild (25-49) calcified plaque noted; small posterolateral. Right Atrium: Right atrial size is within normal limits. Right Ventricle: The right ventricular cavity is within normal limits. Left Atrium: Left atrial size is normal in size with no left atrial appendage filling defect. Left Ventricle: The ventricular cavity size is within normal limits. There are no stigmata of prior infarction. There is no abnormal filling defect. Pulmonary arteries: Normal in size without proximal filling  defect. Pulmonary veins: Normal pulmonary venous drainage. Pericardium: Normal thickness with no significant  effusion or calcium present. Cardiac valves: The aortic valve is trileaflet without significant calcification. The mitral valve is normal structure without significant calcification. Aorta: Dilated ascending aorta (42 mm); aortic atherosclerosis. Extra-cardiac findings: See attached radiology report for non-cardiac structures. IMPRESSION: 1. Coronary calcium score of 1633. This was 74 percentile for age-, sex, and race-matched controls. 2. Normal coronary origin with right dominance. 3. Moderate (50-69) stenoses in the LAD, Lcx, OM2 and RCA; the distal Lcx appears to be occluded. 4. Dilated ascending aorta; aortic atherosclerosis. 5. Study will be sent for FFR. RECOMMENDATIONS: CAD-RADS 5: Total coronary occlusion (100%). Consider cardiac catheterization or viability assessment. Consider symptom-guided anti-ischemic pharmacotherapy as well as risk factor modification per guideline directed care. Kirk Ruths, Jonathan Riley Electronically Signed: By: Kirk Ruths M.D. On: 12/03/2021 14:52     Treatments: surgery: ***   12/24/2021 Patient:  Jonathan Riley Pre-Op Dx: 3V CAD HTN HLP DM   Post-op Dx:  same Procedure: CABG X 5.  LIMA LAD, L radial to PDA, RSVG OM3, OM1, Diagonal   Endoscopic greater saphenous vein harvest on the right Open left radial artery harvest     Surgeon and Role:      * Jonathan Riley, Jonathan Crater, Jonathan Riley - Primary    * B. Stehler , PA-C -  Discharge Exam: Blood pressure (!) 121/58, pulse 74, temperature 98.8 F (37.1 C), temperature source Oral, resp. rate 20, height 5\' 10"  (1.778 m), weight 114.2 kg, SpO2 95 %. {physical [id number]   Discharge Medications:  The patient has been discharged on:   1.Beta Blocker:  Yes [   ]                              No   [   ]                              If No, reason:  2.Ace Inhibitor/ARB: Yes [   ]                                      No  [    ]                                     If No, reason:  3.Statin:   Yes [   ]                  No  [   ]                  If No, reason:  4.Ecasa:  Yes  [   ]                  No   [   ]                  If No, reason:  Patient had ACS upon admission:  Plavix/P2Y12 inhibitor: Yes [   ]                                      No  [   ]  Allergies as of 12/27/2021       Reactions   Percocet [oxycodone-acetaminophen] Nausea And Vomiting   Crestor [rosuvastatin]    Other reaction(s): myalgia/arthralgia (06/2020)     Med Rec must be completed prior to using this [place]***        Signed:  John Giovanni, PA-C  12/27/2021, 1:11 PM

## 2021-12-27 NOTE — TOC Initial Note (Signed)
Transition of Care [facility]) - Initial/Assessment Note    Patient Details  Name: Jonathan Riley MRN: [id number] Date of Birth: [birth year]  [facility] CM/SW Contact:    Gala Lewandowsky, RN Phone Number: 12/27/2021, 3:45 PM  Clinical Narrative:  Patient POD-3 CABG. Per notes- MD is adjusting meds for pain control. Staff RN states patient is moving better around the unit with the assistance of a RW. Case Manager will continue to follow for transition of care needs.     Expected Discharge Plan: Home/Self Care Barriers to Discharge: Continued Medical Work up  Expected Discharge Plan and Services Expected Discharge Plan: Home/Self Care In-house Referral: NA Discharge Planning Services: CM Consult Post Acute Care Choice: NA Living arrangements for the past 2 months: Single Family Home                  Prior Living Arrangements/Services Living arrangements for the past 2 months: Single Family Home Lives with:: Spouse Patient language and need for interpreter reviewed:: Yes        Need for Family Participation in Patient Care: Yes (Comment)     Criminal Activity/Legal Involvement Pertinent to Current Situation/Hospitalization: No - Comment as needed  Activities of Daily Living Home Assistive Devices/Equipment: None ADL Screening (condition at time of admission) Patient's cognitive ability adequate to safely complete daily activities?: Yes Is the patient deaf or have difficulty hearing?: No Does the patient have difficulty seeing, even when wearing glasses/contacts?: No Does the patient have difficulty concentrating, remembering, or making decisions?: No Patient able to express need for assistance with ADLs?: Yes Does the patient have difficulty dressing or bathing?: No Independently performs ADLs?: Yes (appropriate for developmental age) Does the patient have difficulty walking or climbing stairs?: No Weakness of Legs: None Weakness of Arms/Hands:  None  Permission Sought/Granted Permission sought to share information with : Family Supports, Case Manager       Alcohol / Substance Use: Not Applicable Psych Involvement: No (comment)  Admission diagnosis:  Unstable angina (HCC) [I20.0] CAD (coronary artery disease) [I25.10] CAD (coronary artery disease), native coronary artery [I25.10] Coronary artery disease [I25.10] Patient Active Problem List   Diagnosis Date Noted   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   Coronary artery disease 12/24/2021   Type 2 diabetes mellitus without complication, without long-term current use of insulin (HCC)    Unstable angina (HCC) 12/20/2021   CAD (coronary artery disease) 12/20/2021   CAD (coronary artery disease), native coronary artery 12/20/2021   Coronary artery calcification seen on CT scan 11/14/2021   Family history of heart disease 11/14/2021   Pure hypercholesterolemia 11/14/2021   Dyspnea 05/07/2017   Acute respiratory failure with hypoxia (HCC) 05/07/2017   Influenza A 05/07/2017   Essential hypertension 05/07/2017   Primary osteoarthritis of right hip 03/03/2016   Alteration of consciousness 02/21/2013   PCP:  Blair Heys, MD Pharmacy:   [id number] - [place], [place] - [address] [place] [address] Phone: [phone] Fax: [phone]  [id number] - [place], [place] - [address] [place] [address] Phone: [phone] Fax: [phone]   Readmission Risk Interventions     No data to display

## 2021-12-28 ENCOUNTER — Inpatient Hospital Stay (HOSPITAL_COMMUNITY): Payer: BC Managed Care – PPO

## 2021-12-28 LAB — CBC
HCT: 31.6 % — ABNORMAL LOW (ref 39.0–52.0)
Hemoglobin: 10.3 g/dL — ABNORMAL LOW (ref 13.0–17.0)
MCH: 27 pg (ref 26.0–34.0)
MCHC: 32.6 g/dL (ref 30.0–36.0)
MCV: 82.9 fL (ref 80.0–100.0)
Platelets: 141 10*3/uL — ABNORMAL LOW (ref 150–400)
RBC: 3.81 MIL/uL — ABNORMAL LOW (ref 4.22–5.81)
RDW: 14.7 % (ref 11.5–15.5)
WBC: 8.1 10*3/uL (ref 4.0–10.5)
nRBC: 0 % (ref 0.0–0.2)

## 2021-12-28 LAB — BASIC METABOLIC PANEL
Anion gap: 8 (ref 5–15)
BUN: 25 mg/dL — ABNORMAL HIGH (ref 6–20)
CO2: 25 mmol/L (ref 22–32)
Calcium: 8.4 mg/dL — ABNORMAL LOW (ref 8.9–10.3)
Chloride: 102 mmol/L (ref 98–111)
Creatinine, Ser: 1.01 mg/dL (ref 0.61–1.24)
GFR, Estimated: >60 mL/min (ref >60)
Glucose, Bld: 129 mg/dL — ABNORMAL HIGH (ref 70–99)
Potassium: 4 mmol/L (ref 3.5–5.1)
Sodium: 135 mmol/L (ref 135–145)

## 2021-12-28 LAB — GLUCOSE, CAPILLARY
Glucose-Capillary: 105 mg/dL — ABNORMAL HIGH (ref 70–99)
Glucose-Capillary: 113 mg/dL — ABNORMAL HIGH (ref 70–99)
Glucose-Capillary: 119 mg/dL — ABNORMAL HIGH (ref 70–99)
Glucose-Capillary: 124 mg/dL — ABNORMAL HIGH (ref 70–99)
Glucose-Capillary: 145 mg/dL — ABNORMAL HIGH (ref 70–99)
Glucose-Capillary: 99 mg/dL (ref 70–99)

## 2021-12-28 MED ORDER — SODIUM CHLORIDE 0.9 % IV SOLN: 250.0000 mL | INTRAVENOUS | Status: DC | PRN

## 2021-12-28 MED ORDER — INSULIN ASPART 100 UNIT/ML IJ SOLN
0.0000 [IU] | Freq: Three times a day (TID) | INTRAMUSCULAR | Status: DC
  Administered 2021-12-29: 2 [IU] via SUBCUTANEOUS
  Administered 2021-12-29 (×2): 3 [IU] via SUBCUTANEOUS
  Administered 2021-12-30: 2 [IU] via SUBCUTANEOUS

## 2021-12-28 MED ORDER — ALUM & MAG HYDROXIDE-SIMETH 200-200-20 MG/5ML PO SUSP: 15.0000 mL | Freq: Four times a day (QID) | ORAL | Status: DC | PRN

## 2021-12-28 MED ORDER — SODIUM CHLORIDE 0.9% FLUSH
3.0000 mL | INTRAVENOUS | Status: DC | PRN
Start: 2021-12-28 — End: 2021-12-30

## 2021-12-28 MED ORDER — AMLODIPINE BESYLATE 10 MG PO TABS
10.0000 mg | ORAL_TABLET | Freq: Every day | ORAL | Status: DC
  Administered 2021-12-28 – 2021-12-30 (×3): 10 mg via ORAL
  Filled 2021-12-28 (×3): qty 1

## 2021-12-28 MED ORDER — ~~LOC~~ CARDIAC SURGERY, PATIENT & FAMILY EDUCATION: Freq: Once | Status: AC

## 2021-12-28 MED ORDER — MAGNESIUM HYDROXIDE 400 MG/5ML PO SUSP: 30.0000 mL | Freq: Every day | ORAL | Status: DC | PRN

## 2021-12-28 MED ORDER — POTASSIUM CHLORIDE CRYS ER 20 MEQ PO TBCR
20.0000 meq | EXTENDED_RELEASE_TABLET | Freq: Two times a day (BID) | ORAL | Status: DC
  Administered 2021-12-28 – 2021-12-30 (×5): 20 meq via ORAL
  Filled 2021-12-28 (×3): qty 1
  Filled 2021-12-28: qty 2
  Filled 2021-12-28 (×2): qty 1

## 2021-12-28 MED ORDER — SODIUM CHLORIDE 0.9% FLUSH
3.0000 mL | Freq: Two times a day (BID) | INTRAVENOUS | Status: DC
  Administered 2021-12-28 – 2021-12-30 (×4): 3 mL via INTRAVENOUS

## 2021-12-28 MED ORDER — FUROSEMIDE 40 MG PO TABS
40.0000 mg | ORAL_TABLET | Freq: Two times a day (BID) | ORAL | Status: DC
  Administered 2021-12-28 – 2021-12-30 (×5): 40 mg via ORAL
  Filled 2021-12-28 (×5): qty 1

## 2021-12-28 MED ORDER — METFORMIN HCL ER 500 MG PO TB24
500.0000 mg | ORAL_TABLET | Freq: Every day | ORAL | Status: DC
  Administered 2021-12-29: 500 mg via ORAL
  Filled 2021-12-28: qty 1

## 2021-12-28 NOTE — Progress Notes (Signed)
Patient brought to 4E from 2H. VSS. Telemetry box applied, CCMD notified. Patient oriented to room and staff. Call bell in reach. Wife present.  Kenard Gower, RN

## 2021-12-28 NOTE — Progress Notes (Signed)
Patient O2 87% on 1L [place]. Patient with eyes closed resting lying in bed. Increased oxygen to 2L [place]. Current O2 sat 92% 2L [place].  Kenard Gower, RN

## 2021-12-28 NOTE — Progress Notes (Addendum)
4 Days Post-Op Procedure(s) (LRB): CORONARY ARTERY BYPASS GRAFTING (CABG) X 5 BYPASSES USING OPEN LEFT INTERNAL MAMMARY ARTERY, OPEN LEFT RADIAL ARTERY, AND OPEN RIGHT GREATER SAPHENOUS VEIN HARVEST. (N/A) TRANSESOPHAGEAL ECHOCARDIOGRAM (TEE) (N/A) Subjective: Feels better  Objective: Vital signs in last 24 hours: Temp:  [97.6 F (36.4 C)-99 F (37.2 C)] 98.6 F (37 C) (09/09 0400) Pulse Rate:  [64-93] 83 (09/09 0700) Cardiac Rhythm: Normal sinus rhythm (09/09 0400) Resp:  [12-24] 18 (09/09 0700) BP: (119-155)/(54-97) 141/69 (09/09 0700) SpO2:  [92 %-100 %] 95 % (09/09 0700) Weight:  [113.9 kg] 113.9 kg (09/09 0500)  Hemodynamic parameters for last 24 hours:    Intake/Output from previous day: 09/08 0701 - 09/09 0700 In: 717.8 [P.O.:400; I.V.:179.5; IV Piggyback:138.3] Out: -  Intake/Output this shift: No intake/output data recorded.  General appearance: alert, cooperative, and no distress Neurologic: intact Heart: regular rate and rhythm Lungs: diminished breath sounds left base Wound: clean and dry  Lab Results: Recent Labs    12/26/21 0307 12/28/21 0358  WBC 10.5 8.1  HGB 11.6* 10.3*  HCT 35.0* 31.6*  PLT 116* 141*   BMET:  Recent Labs    12/26/21 0307 12/28/21 0358  NA 136 135  K 4.3 4.0  CL 107 102  CO2 22 25  GLUCOSE 155* 129*  BUN 26* 25*  CREATININE 1.18 1.01  CALCIUM 7.9* 8.4*    PT/INR: No results for input(s): "LABPROT", "INR" in the last 72 hours. ABG    Component Value Date/Time   PHART 7.329 (L) 12/25/2021 1401   HCO3 18.6 (L) 12/25/2021 1401   TCO2 20 (L) 12/25/2021 1401   ACIDBASEDEF 7.0 (H) 12/25/2021 1401   O2SAT 97 12/25/2021 1401   CBG (last 3)  Recent Labs    12/28/21 0018 12/28/21 0356 12/28/21 0646  GLUCAP 119* 124* 99    Assessment/Plan: S/P Procedure(s) (LRB): CORONARY ARTERY BYPASS GRAFTING (CABG) X 5 BYPASSES USING OPEN LEFT INTERNAL MAMMARY ARTERY, OPEN LEFT RADIAL ARTERY, AND OPEN RIGHT GREATER SAPHENOUS VEIN  HARVEST. (N/A) TRANSESOPHAGEAL ECHOCARDIOGRAM (TEE) (N/A) POD # 4 NEURO- intact CV- in SR  Hypertension- increase amlodipine to 10 mg daily RESP- left lower lobe atelectasis +/- effusion, down to 2L [place]  Continue IS, add flutter valve  Continue diuresis RENAL- creatinine normal, lytes OK  Continue Lasix + K ENDO- CBG well controlled  Change SSI to [facility] + HS  Resume metformin tomorrow GI- tolerating PO Anemia secondary to ABL- mild, follow SCD + ambulation + enoxaparin Cardiac rehab   LOS: 8 days    Loreli Slot 12/28/2021

## 2021-12-29 ENCOUNTER — Inpatient Hospital Stay (HOSPITAL_COMMUNITY): Payer: BC Managed Care – PPO

## 2021-12-29 LAB — GLUCOSE, CAPILLARY
Glucose-Capillary: 134 mg/dL — ABNORMAL HIGH (ref 70–99)
Glucose-Capillary: 159 mg/dL — ABNORMAL HIGH (ref 70–99)
Glucose-Capillary: 193 mg/dL — ABNORMAL HIGH (ref 70–99)
Glucose-Capillary: 153 mg/dL — ABNORMAL HIGH (ref 70–99)

## 2021-12-29 LAB — CBC
HCT: 31.3 % — ABNORMAL LOW (ref 39.0–52.0)
Hemoglobin: 10.6 g/dL — ABNORMAL LOW (ref 13.0–17.0)
MCH: 27.5 pg (ref 26.0–34.0)
MCHC: 33.9 g/dL (ref 30.0–36.0)
MCV: 81.3 fL (ref 80.0–100.0)
Platelets: 165 10*3/uL (ref 150–400)
RBC: 3.85 MIL/uL — ABNORMAL LOW (ref 4.22–5.81)
RDW: 14.5 % (ref 11.5–15.5)
WBC: 7.2 10*3/uL (ref 4.0–10.5)
nRBC: 0 % (ref 0.0–0.2)

## 2021-12-29 LAB — BASIC METABOLIC PANEL
Anion gap: 8 (ref 5–15)
BUN: 17 mg/dL (ref 6–20)
CO2: 28 mmol/L (ref 22–32)
Calcium: 8.9 mg/dL (ref 8.9–10.3)
Chloride: 102 mmol/L (ref 98–111)
Creatinine, Ser: 0.92 mg/dL (ref 0.61–1.24)
GFR, Estimated: >60 mL/min (ref >60)
Glucose, Bld: 119 mg/dL — ABNORMAL HIGH (ref 70–99)
Potassium: 4.3 mmol/L (ref 3.5–5.1)
Sodium: 138 mmol/L (ref 135–145)

## 2021-12-29 MED ORDER — IRBESARTAN 150 MG PO TABS
75.0000 mg | ORAL_TABLET | Freq: Every day | ORAL | Status: DC
  Administered 2021-12-29 – 2021-12-30 (×2): 75 mg via ORAL
  Filled 2021-12-29 (×2): qty 1

## 2021-12-29 NOTE — Progress Notes (Signed)
Mobility Specialist Progress Note    12/29/21 1337  Mobility  Activity Ambulated independently in hallway  Level of Assistance Independent  Assistive Device None  Distance Ambulated (ft) 470 ft  Activity Response Tolerated well  $Mobility charge 1 Mobility   Pre-Mobility: 73 HR, 94% SpO2 During Mobility: >/=90% SpO2 on RA  Pt received in bed and agreeable. No complaints on walk. Maintained SpO2 >/=90% on RA. Returned to bed with call bell in reach. RN advised to leave on RA.   [place] Mobility Specialist

## 2021-12-29 NOTE — Progress Notes (Addendum)
[address]       [place] [address]             [phone]      5 Days Post-Op Procedure(s) (LRB): CORONARY ARTERY BYPASS GRAFTING (CABG) X 5 BYPASSES USING OPEN LEFT INTERNAL MAMMARY ARTERY, OPEN LEFT RADIAL ARTERY, AND OPEN RIGHT GREATER SAPHENOUS VEIN HARVEST. (N/A) TRANSESOPHAGEAL ECHOCARDIOGRAM (TEE) (N/A) Subjective: Conts to progress, + BM  Objective: Vital signs in last 24 hours: Temp:  [98 F (36.7 C)-98.7 F (37.1 C)] 98.6 F (37 C) (09/10 0433) Pulse Rate:  [65-87] 76 (09/10 0433) Cardiac Rhythm: Normal sinus rhythm (09/09 1943) Resp:  [16-21] 17 (09/10 0433) BP: (122-155)/(63-83) 143/70 (09/10 0433) SpO2:  [87 %-98 %] 94 % (09/10 0433) Weight:  [111.9 kg] 111.9 kg (09/10 0433)  Hemodynamic parameters for last 24 hours:    Intake/Output from previous day: 09/09 0701 - 09/10 0700 In: 350.2 [P.O.:320; I.V.:30.2] Out: 900 [Urine:900] Intake/Output this shift: No intake/output data recorded.  General appearance: alert, cooperative, and no distress Heart: regular rate and rhythm Lungs: min dim left base Abdomen: benign Extremities: minor edema Wound: incis healing well  Lab Results: Recent Labs    12/28/21 0358 12/29/21 0340  WBC 8.1 7.2  HGB 10.3* 10.6*  HCT 31.6* 31.3*  PLT 141* 165   BMET:  Recent Labs    12/28/21 0358 12/29/21 0340  NA 135 138  K 4.0 4.3  CL 102 102  CO2 25 28  GLUCOSE 129* 119*  BUN 25* 17  CREATININE 1.01 0.92  CALCIUM 8.4* 8.9    PT/INR: No results for input(s): "LABPROT", "INR" in the last 72 hours. ABG    Component Value Date/Time   PHART 7.329 (L) 12/25/2021 1401   HCO3 18.6 (L) 12/25/2021 1401   TCO2 20 (L) 12/25/2021 1401   ACIDBASEDEF 7.0 (H) 12/25/2021 1401   O2SAT 97 12/25/2021 1401   CBG (last 3)  Recent Labs    12/28/21 1611 12/28/21 2127 12/29/21 0653  GLUCAP 105* 145* 134*    Meds Scheduled Meds:  acetaminophen  1,000 mg Oral Q6H   Or   acetaminophen (TYLENOL)  oral liquid 160 mg/5 mL  1,000 mg Per Tube Q6H   amLODipine  10 mg Oral Daily   aspirin EC  325 mg Oral Daily   Or   aspirin  324 mg Per Tube Daily   atorvastatin  80 mg Oral Daily   bisacodyl  10 mg Oral Daily   Or   bisacodyl  10 mg Rectal Daily   Chlorhexidine Gluconate Cloth  6 each Topical Daily   docusate sodium  200 mg Oral Daily   enoxaparin (LOVENOX) injection  40 mg Subcutaneous QHS   furosemide  40 mg Oral BID   insulin aspart  0-15 Units Subcutaneous TID WC   lidocaine  1 patch Transdermal Q24H   metFORMIN  500 mg Oral QAC supper   metoprolol tartrate  25 mg Oral BID   pantoprazole  40 mg Oral Daily   polyethylene glycol  17 g Oral Daily   potassium chloride SA  20 mEq Oral BID   senna  1 tablet Oral BID   sodium chloride flush  3 mL Intravenous Q12H   Continuous Infusions:  sodium chloride     promethazine (PHENERGAN) injection (IM or IVPB) Stopped (12/27/21 1133)   PRN Meds:.sodium chloride, alum & mag hydroxide-simeth, ipratropium-albuterol, magnesium hydroxide, melatonin, methocarbamol, metoprolol tartrate, ondansetron (ZOFRAN) IV, mouth rinse, promethazine (  PHENERGAN) injection (IM or IVPB), sodium chloride flush, traMADol  Xrays DG Chest Port 1 View  Result Date: 12/28/2021 CLINICAL DATA:  Pneumothorax. EXAM: PORTABLE CHEST 1 VIEW COMPARISON:  12/26/2021 FINDINGS: Right IJ central venous catheter unchanged. Lungs are adequately inflated with persistent opacification over the left base/retrocardiac region which may be due to combination of effusion with atelectasis although infection is possible. Right lung essentially clear. No pneumothorax. Borderline stable cardiomegaly. Remainder of the exam is unchanged. IMPRESSION: 1. Persistent left base/retrocardiac opacification likely due to effusion/atelectasis, although infection is possible. 2. Right IJ central venous catheter unchanged. Electronically Signed   By: Marin Olp M.D.   On: 12/28/2021 08:02     Assessment/Plan: S/P Procedure(s) (LRB): CORONARY ARTERY BYPASS GRAFTING (CABG) X 5 BYPASSES USING OPEN LEFT INTERNAL MAMMARY ARTERY, OPEN LEFT RADIAL ARTERY, AND OPEN RIGHT GREATER SAPHENOUS VEIN HARVEST. (N/A) TRANSESOPHAGEAL ECHOCARDIOGRAM (TEE) (N/A) POD#5  1 afeb , VSS S BP 120's-150's, sinus rhythm, will add ARB, was on at home( avapro substitutes for micardis) 2 O2 sats ok on 2 liters 3 UOP- voiding, not all measured, weight trending lower, about 2 kg> preop- cont to diurese 4 BS well controlled- resume metformin at d/c 5 met acidosis resolved, CO2 28 6 normal renal fxn 7 expected ABLA has stabilized- minor 8 thrombocytopenia is resolved 9 CXR small left effus, + atx 10 routine pulm hygiene/cardiac rehab, prob home 1-2 days if no new issues 11 on lovenox for DVT ppx    LOS: 9 days    John Giovanni PA-C Pager [phone] 12/29/2021   Patient seen and examined, agree with above Small left effusion on CXR Continue PO Lasix Likely home in a day or two  Remo Lipps C. Roxan Hockey, MD [facility] [phone]

## 2021-12-30 ENCOUNTER — Ambulatory Visit (HOSPITAL_BASED_OUTPATIENT_CLINIC_OR_DEPARTMENT_OTHER): Payer: Self-pay | Admitting: Cardiology

## 2021-12-30 LAB — CBC
HCT: 32.3 % — ABNORMAL LOW (ref 39.0–52.0)
Hemoglobin: 11 g/dL — ABNORMAL LOW (ref 13.0–17.0)
MCH: 27 pg (ref 26.0–34.0)
MCHC: 34.1 g/dL (ref 30.0–36.0)
MCV: 79.4 fL — ABNORMAL LOW (ref 80.0–100.0)
Platelets: 187 10*3/uL (ref 150–400)
RBC: 4.07 MIL/uL — ABNORMAL LOW (ref 4.22–5.81)
RDW: 13.9 % (ref 11.5–15.5)
WBC: 8.3 10*3/uL (ref 4.0–10.5)
nRBC: 0 % (ref 0.0–0.2)

## 2021-12-30 LAB — BASIC METABOLIC PANEL
Anion gap: 11 (ref 5–15)
BUN: 13 mg/dL (ref 6–20)
CO2: 28 mmol/L (ref 22–32)
Calcium: 9 mg/dL (ref 8.9–10.3)
Chloride: 99 mmol/L (ref 98–111)
Creatinine, Ser: 0.94 mg/dL (ref 0.61–1.24)
GFR, Estimated: >60 mL/min (ref >60)
Glucose, Bld: 121 mg/dL — ABNORMAL HIGH (ref 70–99)
Potassium: 4.2 mmol/L (ref 3.5–5.1)
Sodium: 138 mmol/L (ref 135–145)

## 2021-12-30 LAB — GLUCOSE, CAPILLARY: Glucose-Capillary: 126 mg/dL — ABNORMAL HIGH (ref 70–99)

## 2021-12-30 MED ORDER — AMLODIPINE BESYLATE 10 MG PO TABS: 10.0000 mg | ORAL_TABLET | Freq: Every day | ORAL | 1 refills | Status: DC

## 2021-12-30 MED ORDER — TRAMADOL HCL 50 MG PO TABS: 50.0000 mg | ORAL_TABLET | Freq: Four times a day (QID) | ORAL | 0 refills | Status: DC | PRN

## 2021-12-30 MED ORDER — FUROSEMIDE 40 MG PO TABS: 40.0000 mg | ORAL_TABLET | Freq: Every day | ORAL | 0 refills | Status: DC

## 2021-12-30 MED ORDER — POTASSIUM CHLORIDE CRYS ER 20 MEQ PO TBCR: 20.0000 meq | EXTENDED_RELEASE_TABLET | Freq: Every day | ORAL | 0 refills | Status: DC

## 2021-12-30 MED ORDER — ASPIRIN 325 MG PO TBEC: 325.0000 mg | DELAYED_RELEASE_TABLET | Freq: Every day | ORAL | Status: DC

## 2021-12-30 MED ORDER — METOPROLOL TARTRATE 25 MG PO TABS: 25.0000 mg | ORAL_TABLET | Freq: Two times a day (BID) | ORAL | 1 refills | Status: DC

## 2021-12-30 MED ORDER — ATORVASTATIN CALCIUM 80 MG PO TABS: 80.0000 mg | ORAL_TABLET | Freq: Every day | ORAL | 1 refills | Status: DC

## 2021-12-30 NOTE — Progress Notes (Signed)
CARDIAC REHAB PHASE I   Pt is resting in bed feeling good this morning, except mild nausea RN aware. Ambulated with mobility team this morning states he tolerated with no problems. OHS home education including IS use at home, risk factors, sternal precautions, exercise guidelines, restrictions, heart healthy diabetic diet, site care, move in tub, and CRP2 reviewed with pt and wife. All questions and concerns addressed. Will refer to AP for CRP2. Plan for home today.   [id number]  Woodroe Chen, RN BSN 12/30/2021 9:24 AM

## 2021-12-30 NOTE — Progress Notes (Signed)
Mobility Specialist Progress Note:   12/30/21 0846  Mobility  Activity Ambulated with assistance in hallway  Level of Assistance Independent  Assistive Device None  Distance Ambulated (ft) 470 ft  Activity Response Tolerated well  $Mobility charge 1 Mobility   Pt received in bed willing to participate in mobility. Complaints of nausea, RN notified. Left in bed with call bell in reach and all needs met.   [facility] Artesha Wemhoff Mobility Specialist'

## 2021-12-30 NOTE — TOC Transition Note (Signed)
Transition of Care (TOC) - CM/SW Discharge Note Donn Pierini RN, BSN Transitions of Care Unit 4E- RN Case Manager See Treatment Team for direct phone #    Patient Details  Name: Jonathan Riley MRN: [id number] Date of Birth: May 01, 1960  Transition of [facility]) CM/SW Contact:  Darrold Span, RN Phone Number: 12/30/2021, 9:37 AM   Clinical Narrative:    Pt stable for transition home today, No TOC needs noted for discharge. Pt has transportation home, no DME needs noted. Follow up appointments in place.    Final next level of care: Home/Self Care Barriers to Discharge: Barriers Resolved   Patient Goals and CMS Choice     Choice offered to / list presented to : NA  Discharge Placement                 Home      Discharge Plan and Services In-house Referral: NA Discharge Planning Services: CM Consult Post Acute Care Choice: NA          DME Arranged: N/A DME Agency: NA       HH Arranged: NA HH Agency: NA        Social Determinants of Health (SDOH) Interventions     Readmission Risk Interventions    12/30/2021    9:37 AM  Readmission Risk Prevention Plan  Post Dischage Appt Complete  Medication Screening Complete  Transportation Screening Complete

## 2021-12-30 NOTE — Progress Notes (Signed)
[address]       [place] [address]             [phone]      6 Days Post-Op Procedure(s) (LRB): CORONARY ARTERY BYPASS GRAFTING (CABG) X 5 BYPASSES USING OPEN LEFT INTERNAL MAMMARY ARTERY, OPEN LEFT RADIAL ARTERY, AND OPEN RIGHT GREATER SAPHENOUS VEIN HARVEST. (N/A) TRANSESOPHAGEAL ECHOCARDIOGRAM (TEE) (N/A) Subjective: Feeling well. Wants yo go home  Objective: Vital signs in last 24 hours: Temp:  [98.3 F (36.8 C)-99.1 F (37.3 C)] 99.1 F (37.3 C) (09/11 0259) Pulse Rate:  [64-86] 64 (09/11 0259) Cardiac Rhythm: Normal sinus rhythm (09/10 1926) Resp:  [17-20] 17 (09/11 0259) BP: (124-170)/(66-84) 152/84 (09/11 0259) SpO2:  [92 %-95 %] 92 % (09/11 0259) Weight:  [109.9 kg] 109.9 kg (09/11 0550)  Hemodynamic parameters for last 24 hours:    Intake/Output from previous day: 09/10 0701 - 09/11 0700 In: 720 [P.O.:720] Out: 400 [Urine:400] Intake/Output this shift: No intake/output data recorded.  General appearance: alert, cooperative, and no distress Heart: regular rate and rhythm Lungs: mildly dim in bases Abdomen: benign Extremities: minimal edema Wound: incis healing well  Lab Results: Recent Labs    12/29/21 0340 12/30/21 0257  WBC 7.2 8.3  HGB 10.6* 11.0*  HCT 31.3* 32.3*  PLT 165 187   BMET:  Recent Labs    12/29/21 0340 12/30/21 0257  NA 138 138  K 4.3 4.2  CL 102 99  CO2 28 28  GLUCOSE 119* 121*  BUN 17 13  CREATININE 0.92 0.94  CALCIUM 8.9 9.0    PT/INR: No results for input(s): "LABPROT", "INR" in the last 72 hours. ABG    Component Value Date/Time   PHART 7.329 (L) 12/25/2021 1401   HCO3 18.6 (L) 12/25/2021 1401   TCO2 20 (L) 12/25/2021 1401   ACIDBASEDEF 7.0 (H) 12/25/2021 1401   O2SAT 97 12/25/2021 1401   CBG (last 3)  Recent Labs    12/29/21 1651 12/29/21 2105 12/30/21 0555  GLUCAP 193* 153* 126*    Meds Scheduled Meds:  amLODipine  10 mg Oral Daily   aspirin EC  325 mg Oral Daily   Or    aspirin  324 mg Per Tube Daily   atorvastatin  80 mg Oral Daily   bisacodyl  10 mg Oral Daily   Or   bisacodyl  10 mg Rectal Daily   Chlorhexidine Gluconate Cloth  6 each Topical Daily   docusate sodium  200 mg Oral Daily   enoxaparin (LOVENOX) injection  40 mg Subcutaneous QHS   furosemide  40 mg Oral BID   insulin aspart  0-15 Units Subcutaneous TID WC   irbesartan  75 mg Oral Daily   lidocaine  1 patch Transdermal Q24H   metFORMIN  500 mg Oral QAC supper   metoprolol tartrate  25 mg Oral BID   pantoprazole  40 mg Oral Daily   polyethylene glycol  17 g Oral Daily   potassium chloride SA  20 mEq Oral BID   senna  1 tablet Oral BID   sodium chloride flush  3 mL Intravenous Q12H   Continuous Infusions:  sodium chloride     promethazine (PHENERGAN) injection (IM or IVPB) Stopped (12/27/21 1133)   PRN Meds:.sodium chloride, alum & mag hydroxide-simeth, ipratropium-albuterol, magnesium hydroxide, melatonin, methocarbamol, metoprolol tartrate, ondansetron (ZOFRAN) IV, mouth rinse, promethazine (PHENERGAN) injection (IM or IVPB), sodium chloride flush, traMADol  Xrays DG Chest 2 View  Result Date:  12/29/2021 CLINICAL DATA:  Unstable angina EXAM: CHEST - 2 VIEW COMPARISON:  Prior chest x-ray 12/28/2021 FINDINGS: The central line is been removed. Stable cardiomegaly. Patient is status post median sternotomy. This SP slightly improved pulmonary vascular congestion. Persistent patchy airspace opacity in the left lung base largely obscuring the left hemidiaphragm. Lateral view confirms the presence of a moderate pleural effusion. No pneumothorax. No acute osseous abnormality. IMPRESSION: 1. Moderate left layering pleural effusion with associated left basilar atelectasis. 2. Interval removal of central line. Electronically Signed   By: Malachy Moan M.D.   On: 12/29/2021 07:42    Assessment/Plan: S/P Procedure(s) (LRB): CORONARY ARTERY BYPASS GRAFTING (CABG) X 5 BYPASSES USING OPEN LEFT  INTERNAL MAMMARY ARTERY, OPEN LEFT RADIAL ARTERY, AND OPEN RIGHT GREATER SAPHENOUS VEIN HARVEST. (N/A) TRANSESOPHAGEAL ECHOCARDIOGRAM (TEE) (N/A)  POD#6  1 afeb, VSS, S BP 120's-170's, sinus rhythm 2 O2 sats good on RA 3 weight below preop, voiding well, will cont short term lasix with pleural effusions 4 normal renal fxn 5 H/H stable 6 stable for d/c- reviewed instructions   LOS: 10 days    Rowe Clack PA-C Pager [phone] 12/30/2021

## 2021-12-30 NOTE — Plan of Care (Signed)
  Problem: Education: Goal: Understanding of CV disease, CV risk reduction, and recovery process will improve Outcome: Adequate for Discharge Goal: Individualized Educational Video(s) Outcome: Adequate for Discharge   Problem: Activity: Goal: Ability to return to baseline activity level will improve Outcome: Adequate for Discharge   Problem: Cardiovascular: Goal: Ability to achieve and maintain adequate cardiovascular perfusion will improve Outcome: Adequate for Discharge Goal: Vascular access site(s) Level 0-1 will be maintained Outcome: Adequate for Discharge   Problem: Health Behavior/Discharge Planning: Goal: Ability to safely manage health-related needs after discharge will improve Outcome: Adequate for Discharge   Problem: Clinical Measurements: Goal: Ability to maintain clinical measurements within normal limits will improve Outcome: Adequate for Discharge Goal: Will remain free from infection Outcome: Adequate for Discharge Goal: Diagnostic test results will improve Outcome: Adequate for Discharge Goal: Respiratory complications will improve Outcome: Adequate for Discharge Goal: Cardiovascular complication will be avoided Outcome: Adequate for Discharge   Problem: Activity: Goal: Risk for activity intolerance will decrease Outcome: Adequate for Discharge   Problem: Nutrition: Goal: Adequate nutrition will be maintained Outcome: Adequate for Discharge

## 2021-12-30 NOTE — Progress Notes (Signed)
Patient given discharge instructions and stated understanding. 

## 2021-12-31 ENCOUNTER — Telehealth: Payer: Self-pay

## 2021-12-31 NOTE — Telephone Encounter (Signed)
FMLA form completed and faxed to [phone]/ [employer] Integrated disability service . Beginning LOA 12/20/21 through 03/24/22. Forms mailed to home address

## 2022-01-01 ENCOUNTER — Other Ambulatory Visit: Payer: Self-pay | Admitting: *Deleted

## 2022-01-01 MED ORDER — ONDANSETRON HCL 4 MG PO TABS: 4.0000 mg | ORAL_TABLET | Freq: Three times a day (TID) | ORAL | 0 refills | Status: AC | PRN

## 2022-01-01 MED FILL — Lidocaine HCl Local Preservative Free (PF) Inj 2%: INTRAMUSCULAR | Qty: 15 | Status: AC

## 2022-01-01 MED FILL — Calcium Chloride Inj 10%: INTRAVENOUS | Qty: 10 | Status: AC

## 2022-01-01 MED FILL — Sodium Chloride IV Soln 0.9%: INTRAVENOUS | Qty: 2000 | Status: AC

## 2022-01-01 MED FILL — Heparin Sodium (Porcine) Inj 1000 Unit/ML: INTRAMUSCULAR | Qty: 40 | Status: AC

## 2022-01-01 MED FILL — Sodium Bicarbonate IV Soln 8.4%: INTRAVENOUS | Qty: 50 | Status: AC

## 2022-01-01 MED FILL — Electrolyte-R (PH 7.4) Solution: INTRAVENOUS | Qty: 3000 | Status: AC

## 2022-01-01 MED FILL — Heparin Sodium (Porcine) Inj 1000 Unit/ML: Qty: 1000 | Status: AC

## 2022-01-01 MED FILL — Potassium Chloride Inj 2 mEq/ML: INTRAVENOUS | Qty: 40 | Status: AC

## 2022-01-01 MED FILL — Mannitol IV Soln 20%: INTRAVENOUS | Qty: 500 | Status: AC

## 2022-01-01 NOTE — Progress Notes (Signed)
Patient's wife, Marcelino Duster, contacted the office stating patient woke up very nauseous. Per wife, he remained nauseous in the hospital. Wife states patient is eating, drinking, and having BM's. Patient is taking Tramadol PRN. Per Webb Laws, PA, Zofran sent to patient's preferred pharmacy. Wife made aware of medication .

## 2022-01-10 ENCOUNTER — Ambulatory Visit (INDEPENDENT_AMBULATORY_CARE_PROVIDER_SITE_OTHER): Payer: Self-pay | Admitting: Thoracic Surgery (Cardiothoracic Vascular Surgery)

## 2022-01-10 DIAGNOSIS — Z951 Presence of aortocoronary bypass graft: Secondary | ICD-10-CM

## 2022-01-10 NOTE — Progress Notes (Signed)
     [address]       [place] [address]             [phone]       Patient: Home Provider: Office Consent for Telemedicine visit obtained.  Today's visit was completed via a real-time telehealth (see specific modality noted below). The patient/authorized person provided oral consent at the time of the visit to engage in a telemedicine encounter with the present provider at [facility]. The patient/authorized person was informed of the potential benefits, limitations, and risks of telemedicine. The patient/authorized person expressed understanding that the laws that protect confidentiality also apply to telemedicine. The patient/authorized person acknowledged understanding that telemedicine does not provide emergency services and that he or she would need to call 911 or proceed to the nearest hospital for help if such a need arose.   Total time spent in the clinical discussion 10 minutes.  Telehealth Modality: Phone visit (audio only)  I had a telephone visit with  Jonathan Riley who is s/p CABG.  Overall doing well.   Pain is minimal.  Ambulating well. Vitals have been stable.  Jonathan Riley will see [place] back in 1 month with a chest x-ray for cardiac rehab clearance.  Jonathan Riley

## 2022-01-19 NOTE — Progress Notes (Unsigned)
Cardiology Office Note:    Date:  01/20/2022   ID:  Jonathan Riley, DOB 1960-11-22, MRN [id number]  PCP:  Gaynelle Arabian, MD   [facility] Providers Cardiologist:  Buford Dresser, MD     Referring MD: Gaynelle Arabian, MD   CC: Here for post CABG follow-up  History of Present Illness:    Jonathan Riley is a 61 y.o. male with a hx of the following:   CAD, s/p CABG x 5  HTN Pure hypercholesterolemia T2DM Family hx of CVD Obesity Former smoker Aortic dilitation  Was initially referred to cardiology by his PCP for evaluation and management of DOE.  Significant positive family history of cardiovascular disease.  Previous coronary CTA revealed coronary calcium score of 1633 on December 03, 2021.  FFR suggested distal LAD and distal RCA flow-limiting but in very distal vessel; mid left circumflex lesion flow limiting followed by CTO.  At follow-up visit with Dr. Harrell Gave on December 10, 2021, he continued to note dyspnea on exertion and had recent neck and shoulder pain.  At 1 point, he noted pain in the throat associated with tremors, was fearful he was having a heart attack.  Echocardiogram on November 27, 2021 revealed LVEF 60 to 65%, moderate LVH, no RWMA of left ventricle, trivial MR, aortic dilatation noted, measuring 43 mm, otherwise no significant valvular abnormalities.    Cardiac catheterization was recommended and arranged. Cardiac cath demonstrated severe three-vessel CAD, cardiology team felt there was possibly a left main coronary dissection that occurred on the initial injection of contrast.  TCTS was consulted.  Underwent a CABG x5, utilizing LIMA to LAD, radial artery to PDA, SVG to OM1, SVG to OM 3, and SVG to diagonal.  Tolerated procedure well.  Went into A-fib intraoperatively, was placed on amiodarone drip, was chemically cardioverted to sinus rhythm, Amio d/c.  After post extubation, he had increasing oxygen needs and was therefore placed on BiPAP.   Postop thrombocytopenia resolved.  Required diuresis for volume overload, noted to have left-sided small pleural effusion. Was on daily lasix and potassium at d/c and has completed therapy.   Today he presents for follow-up. Overall he says he is doing well after his procedure. CC today is he says he is still having some post surgical pain, says he sleeps in a recliner to help with this. He says he is all out of Tramadol and is requesting a refill on this today. Says he still has some shortness of breath with exertion, has remained the same since before his surgery. He is following his post procedure precautions and sternal precautions, says surgical incisions are healing well. BP at home is well controlled. Denies any chest pain, palpitations, syncope, presyncope, dizziness, orthopnea, PND, swelling, acute bleeding, or claudication.  Past Medical History:  Diagnosis Date   Arthritis    Depression    Diabetes mellitus without complication ([place])    Dyspnea    "with exertion"   Hypertension    Nocturia    1-2 times during night   PONV (postoperative nausea and vomiting)    "nausea after a knee surgery"    Past Surgical History:  Procedure Laterality Date   COLONOSCOPY     CORONARY ARTERY BYPASS GRAFT N/A 12/24/2021   Procedure: CORONARY ARTERY BYPASS GRAFTING (CABG) X 5 BYPASSES USING OPEN LEFT INTERNAL MAMMARY ARTERY, OPEN LEFT RADIAL ARTERY, AND OPEN RIGHT GREATER SAPHENOUS VEIN HARVEST.;  Surgeon: Lajuana Matte, MD;  Location: [facility];  Service: Open Heart Surgery;  Laterality: N/A;   KNEE SURGERY Bilateral    Right, Left x2   LEFT HEART CATH AND CORONARY ANGIOGRAPHY N/A 12/20/2021   Procedure: LEFT HEART CATH AND CORONARY ANGIOGRAPHY;  Surgeon: Belva Crome, MD;  Location: [facility];  Service: Cardiovascular;  Laterality: N/A;   TEE WITHOUT CARDIOVERSION N/A 12/24/2021   Procedure: TRANSESOPHAGEAL ECHOCARDIOGRAM (TEE);  Surgeon: Lajuana Matte, MD;  Location: [facility];   Service: Open Heart Surgery;  Laterality: N/A;   TOTAL HIP ARTHROPLASTY Right 03/03/2016   Procedure: TOTAL HIP ARTHROPLASTY ANTERIOR APPROACH;  Surgeon: Rod Can, MD;  Location: [facility];  Service: Orthopedics;  Laterality: Right;   WRIST SURGERY Right 2008 or 2009    Current Medications: Current Meds  Medication Sig   amLODipine (NORVASC) 10 MG tablet Take 1 tablet (10 mg total) by mouth daily.   aspirin EC 325 MG tablet Take 1 tablet (325 mg total) by mouth daily.   atorvastatin (LIPITOR) 80 MG tablet Take 1 tablet (80 mg total) by mouth daily.   metFORMIN (GLUCOPHAGE-XR) 500 MG 24 hr tablet Take 500 mg by mouth daily.   metoprolol tartrate (LOPRESSOR) 25 MG tablet Take 1 tablet (25 mg total) by mouth 2 (two) times daily.   sildenafil (REVATIO) 20 MG tablet SMARTSIG:2-5 Tablet(s) By Mouth PRN   telmisartan (MICARDIS) 80 MG tablet Take 80 mg by mouth daily.     Allergies:   Percocet [oxycodone-acetaminophen] and Crestor [rosuvastatin]   Social History   Socioeconomic History   Marital status: Married    Spouse name: Sharyn Lull   Number of children: 3   Years of education: College   Highest education level: Not on file  Occupational History    Employer: [employer]  Tobacco Use   Smoking status: Former    Types: Cigarettes   Smokeless tobacco: Never   Tobacco comments:    "quit about 12 years ago" 02/26/2016  Substance and Sexual Activity   Alcohol use: No   Drug use: No   Sexual activity: Yes  Other Topics Concern   Not on file  Social History Narrative   Patient lives at home with his family.   Caffeine Use: 2 cups of coffee and 2 sodas daily   Social Determinants of Health   Financial Resource Strain: Low Risk  (11/14/2021)   Overall Financial Resource Strain (CARDIA)    Difficulty of Paying Living Expenses: Not hard at all  Food Insecurity: No Food Insecurity (11/14/2021)   Hunger Vital Sign    Worried About Running Out of Food in the Last Year: Never true    Ran  Out of Food in the Last Year: Never true  Transportation Needs: No Transportation Needs (11/14/2021)   PRAPARE - Hydrologist (Medical): No    Lack of Transportation (Non-Medical): No  Physical Activity: Inactive (11/14/2021)   Exercise Vital Sign    Days of Exercise per Week: 0 days    Minutes of Exercise per Session: 0 min  Stress: Not on file  Social Connections: Not on file     Family History: The patient's family history includes Cancer in his unknown relative; Diabetes in his unknown relative; Heart disease in his unknown relative.  ROS:   Review of Systems  Constitutional: Negative.   HENT: Negative.    Eyes: Negative.   Respiratory:  Positive for shortness of breath. Negative for cough, hemoptysis, sputum production and wheezing.        See HPI.  DOE.   Cardiovascular: Negative.   Gastrointestinal: Negative.   Genitourinary: Negative.   Musculoskeletal: Negative.   Skin: Negative.   Neurological: Negative.   Endo/Heme/Allergies: Negative.   Psychiatric/Behavioral: Negative.      Please see the history of present illness.    All other systems reviewed and are negative.  EKGs/Labs/Other Studies Reviewed:    The following studies were reviewed today:   EKG:  EKG is ordered today.  The ekg ordered today demonstrates sinus bradycardia, 58 bpm, p wave inversion and post recovery EKG changes, otherwise nothing acute.   Left heart cath and coronary angiography on December 20, 2021: Left main trunk 25 to 30% stenosis and possibly catheter induced dissection/trauma at the time of first injection. Proximal to mid segmental 60 to 70% narrowing. Severely diseased circumflex with 70% first obtuse marginal 80% segmental second marginal and total occlusion of the distal circumflex. High-grade mid RCA stenosis, greater than 95% with TIMI-3 flow.  Proximal RCA and PDA 60 to 70% stenoses. Severe elevation in LVEDP, 26 mmHg.  Systolic function is normal.    Recommendation:   Observation overnight because of left main situation. Consult colleagues and also have TCT [place] to look at the patient's pictures to help develop a treatment plan.  Because of severity of RCA stenosis we need to revascularize in some form or fashion within the next several days to 1 week. Potentially, if left main is a ruptured plaque, we should consider coronary bypass grafting.  If not, consider PCI of RCA and medical treatment of the circumflex territory with clinical observation of left coronary and left main disease. In the meantime, we should treat diastolic heart failure aggressively.    Coronary CTA on December 03, 2021 with FFR: 1. Coronary calcium score of 1633. This was 107 percentile for age-, sex, and race-matched controls.   2. Normal coronary origin with right dominance.   3. Moderate (50-69) stenoses in the LAD, Lcx, OM2 and RCA; the distal Lcx appears to be occluded.   4. Dilated ascending aorta; aortic atherosclerosis.   5. Study will be sent for FFR. FFR suggests distal LAD and distal RCA flow limiting but in very distal vessel; mid Lcx lesion flow limiting followed by CTO.   2D echo on November 27, 2021:  1. Left ventricular ejection fraction, by estimation, is 60 to 65%. The  left ventricle has normal function. The left ventricle has no regional  wall motion abnormalities. There is moderate left ventricular hypertrophy.  Left ventricular diastolic  parameters are indeterminate.   2. Right ventricular systolic function is normal. The right ventricular  size is normal. Tricuspid regurgitation signal is inadequate for assessing  PA pressure.   3. The mitral valve is normal in structure. Trivial mitral valve  regurgitation. No evidence of mitral stenosis.   4. The aortic valve was not well visualized. Aortic valve regurgitation  is trivial. No aortic stenosis is present.   5. Aortic dilatation noted. There is dilatation of the ascending aorta,   measuring 43 mm.  Vascular ultrasound lower extremity venous (DVT) on March 06, 2016: - No evidence of deep vein thrombosis involving the right lower    extremity and left common femoral vein.  - No evidence of Baker&'s cyst on the right.    Recent Labs: 04/14/2021: ALT 29 12/25/2021: Magnesium 2.2 12/30/2021: BUN 13; Creatinine, Ser 0.94; Hemoglobin 11.0; Platelets 187; Potassium 4.2; Sodium 138  Recent Lipid Panel    Component Value Date/Time   CHOL 196 12/10/2021  0932   TRIG 246 (H) 12/10/2021 0932   HDL 30 (L) 12/10/2021 0932   CHOLHDL 6.5 (H) 12/10/2021 0932   LDLCALC 122 (H) 12/10/2021 0932     Risk Assessment/Calculations:    CHA2DS2-VASc Score = 3  This indicates a 3.2% annual risk of stroke. The patient's score is based upon: CHF History: 0 HTN History: 1 Diabetes History: 1 Stroke History: 0 Vascular Disease History: 1 Age Score: 0 Gender Score: 0   The 10-year ASCVD risk score (Arnett DK, et al., 2019) is: 20%   Values used to calculate the score:     Age: 17 years     Sex: Male     Is Non-Hispanic African American: No     Diabetic: Yes     Tobacco smoker: No     Systolic Blood Pressure: [id number] mmHg     Is BP treated: Yes     HDL Cholesterol: 30 mg/dL     Total Cholesterol: 196 mg/dL   Physical Exam:    VS:  BP 110/64 (BP Location: Left Arm, Patient Position: Sitting, Cuff Size: Large)   Pulse (!) 58   Ht 5\' 10"  (1.778 m)   Wt 239 lb 14.4 oz (108.8 kg)   BMI 34.42 kg/m     Wt Readings from Last 3 Encounters:  01/20/22 239 lb 14.4 oz (108.8 kg)  12/30/21 242 lb 4.8 oz (109.9 kg)  12/10/21 253 lb 8 oz (115 kg)     GEN: Well nourished, well developed in no acute distress HEENT: Normal NECK: No JVD; No carotid bruits CARDIAC: S1/S2, slow rate and regular rhythm, no murmurs, rubs, gallops; 2+ peripheral pulses throughout, strong and equal bilaterally; surgical sites healing well, no signs of erythema, swelling, or drainage noted. RESPIRATORY:   Clear and diminished to auscultation without rales, wheezing or rhonchi  MUSCULOSKELETAL:  Trace, minimal nonpitting edema along BLE; No deformity  SKIN: Warm and dry NEUROLOGIC:  Alert and oriented x 3 PSYCHIATRIC:  Normal affect   ASSESSMENT:    1. Coronary artery disease of native artery of native heart with stable angina pectoris ([place])   2. S/P CABG x 5   3. Dyspnea on exertion   4. Pure hypercholesterolemia   5. Hypertension, unspecified type   6. Medication management   7. Aortic dilatation (HCC)   8. Class 1 obesity with body mass index (BMI) of 34.0 to 34.9 in adult, unspecified obesity type, unspecified whether serious comorbidity present    PLAN:    In order of problems listed above:  CAD, s/p CABG x 5 (12/2021) Stable with no anginal symptoms. No indication for ischemic evaluation.  Only notices DOE, likely due to recent surgery and deconditioning. EKG today shows SB, 58 bpm, p wave inversion and post recovery EKG changes, otherwise nothing acute. Recommended Tylenol 1,000 mg BID for pain management, recommended to contact TCTS regarding Tramadol refill. Will f/u with TCTS this month and will repeat CXR and will check for cardiac rehab clearance. Continue ASA, Lipitor, Metoprolol, amlodipine, and telmisartan. ED precautions discussed. Went into A-fib postoperatively, converted to NSR after Amiodarone dttp initiated. Not on [facility] due to medical conversion to NSR, no A-fib recurrence.   Cardiac Rehabilitation Eligibility Assessment  The patient is NOT ready to start cardiac rehabilitation due to: The patient is still requiring narcotics to control post surgical pain. (Has not seen surgeon for in person follow-up yet)    2. Dyspnea on exertion Most likely due to recent surgery and deconditioning. Discussed  conservative measures, including walking and cardiac rehab. Will follow-up with CTS for follow-up CXR and cardiac rehab clearance. ED precautions discussed.   3. Pure  hypercholesterolemia Last LDL 122 in August 2023. Started on Lipitor post CABG. Will check FLP and LFT in 2 months per protocol. Continue current medication regimen.  4. HTN, medication management BP today, 110/64. BP well controlled at home. Will check BMET today. Continue current medication regimen.   5. Aortic dilatation Noted on Echo in August 2023, measuring 43 mm. Recommend updating 2D echo next year for monitoring. Care precautions discussed.   6. Class 1 Obesity BMI today 34.42. Weight loss via diet encouraged, also encouraged sternal precautions and post procedure precautions. Discussed the impact being overweight would have on cardiovascular risk. Will follow-up with TCTS later this month who will see whether pt is appropriate to begin cardiac rehab.   7. Disposition: Patient requesting to keep follow-up appt that is scheduled with Dr. Harrell Gave. Follow-up with Dr. Harrell Gave in 2 weeks or sooner if anything changes.    Medication Adjustments/Labs and Tests Ordered: Current medicines are reviewed at length with the patient today.  Concerns regarding medicines are outlined above.  Orders Placed This Encounter  Procedures   Basic metabolic panel   Hepatic function panel   Lipid panel   EKG 12-Lead   No orders of the defined types were placed in this encounter.   Patient Instructions  Medication Instructions:  Your Physician recommend you continue on your current medication as directed.    You may take Tylenol 1,000mg  twice daily as needed for post op pain management.   *If you need a refill on your cardiac medications before your next appointment, please call your pharmacy*   Lab Work: Your physician recommends that you return for lab work today- BMP   Please return for Lab work in 2 months for fasting Lipid Panel and Liver Function Tests. You may come to the...   [facility] (3rd floor) [address], [place], [place] [address]  Open: 8am-Noon and  1pm-4:30pm  Please ring the doorbell on the small table when you exit the elevator and the Lab Tech will come get you  [place] at [facility] [address] [place], [place], [place] [address] Open: 8am-1pm, then 2pm-4:30pm   [place]- Please see attached locations sheet stapled to your lab work with address and hours.   If you have labs (blood work) drawn today and your tests are completely normal, you will receive your results only by: [place] (if you have MyChart) OR A paper copy in the mail If you have any lab test that is abnormal or we need to change your treatment, we will call you to review the results.  Follow-Up: At [facility], you and your health needs are our priority.  As part of our continuing mission to provide you with exceptional heart care, we have created designated Provider Care Teams.  These Care Teams include your primary Cardiologist (physician) and Advanced Practice Providers (APPs -  Physician Assistants and Nurse Practitioners) who all work together to provide you with the care you need, when you need it.  We recommend signing up for the patient portal called "MyChart".  Sign up information is provided on this After Visit Summary.  MyChart is used to connect with patients for Virtual Visits (Telemedicine).  Patients are able to view lab/test results, encounter notes, upcoming appointments, etc.  Non-urgent messages can be sent to your provider as  well.   To learn more about what you can do with MyChart, go to [email or web address].    Your next appointment:   Follow up as scheduled with Dr. Harrell Gave   Other Instructions Heart Healthy Diet Recommendations: A low-salt diet is recommended. Meats should be grilled, baked, or boiled. Avoid fried foods. Focus on lean protein sources like fish or chicken with vegetables and fruits. The American Heart Association is a Microbiologist!  American Heart Association  Diet and Lifeystyle Recommendations   Exercise recommendations: The American Heart Association recommends 150 minutes of moderate intensity exercise weekly. Try 30 minutes of moderate intensity exercise 4-5 times per week. This could include walking, jogging, or swimming.   Important Information About Sugar         Signed, Finis Bud, NP  01/20/2022 11:28 AM    [place] [facility]

## 2022-01-20 ENCOUNTER — Encounter (HOSPITAL_BASED_OUTPATIENT_CLINIC_OR_DEPARTMENT_OTHER): Payer: Self-pay | Admitting: Nurse Practitioner

## 2022-01-20 ENCOUNTER — Ambulatory Visit (INDEPENDENT_AMBULATORY_CARE_PROVIDER_SITE_OTHER): Payer: BC Managed Care – PPO | Admitting: Nurse Practitioner

## 2022-01-20 VITALS — BP 110/64 | HR 58 | Ht 70.0 in | Wt 239.9 lb

## 2022-01-20 DIAGNOSIS — Z951 Presence of aortocoronary bypass graft: Secondary | ICD-10-CM | POA: Diagnosis not present

## 2022-01-20 DIAGNOSIS — E669 Obesity, unspecified: Secondary | ICD-10-CM

## 2022-01-20 DIAGNOSIS — I77819 Aortic ectasia, unspecified site: Secondary | ICD-10-CM

## 2022-01-20 DIAGNOSIS — I25118 Atherosclerotic heart disease of native coronary artery with other forms of angina pectoris: Secondary | ICD-10-CM | POA: Diagnosis not present

## 2022-01-20 DIAGNOSIS — E7800 Pure hypercholesterolemia, unspecified: Secondary | ICD-10-CM

## 2022-01-20 DIAGNOSIS — E66811 Obesity, class 1: Secondary | ICD-10-CM

## 2022-01-20 DIAGNOSIS — Z79899 Other long term (current) drug therapy: Secondary | ICD-10-CM | POA: Diagnosis not present

## 2022-01-20 DIAGNOSIS — R0609 Other forms of dyspnea: Secondary | ICD-10-CM

## 2022-01-20 DIAGNOSIS — Z6834 Body mass index (BMI) 34.0-34.9, adult: Secondary | ICD-10-CM

## 2022-01-20 DIAGNOSIS — I10 Essential (primary) hypertension: Secondary | ICD-10-CM

## 2022-01-20 NOTE — Patient Instructions (Signed)
Medication Instructions:  Your Physician recommend you continue on your current medication as directed.    You may take Tylenol 1,000mg  twice daily as needed for post op pain management.   *If you need a refill on your cardiac medications before your next appointment, please call your pharmacy*   Lab Work: Your physician recommends that you return for lab work today- BMP   Please return for Lab work in 2 months for fasting Lipid Panel and Liver Function Tests. You may come to the...   [facility] (3rd floor) [address] [place], [place] [address]  Open: 8am-Noon and 1pm-4:30pm  Please ring the doorbell on the small table when you exit the elevator and the Lab Tech will come get you  [place] at [facility] [address] [place], [place], [place] [address] Open: 8am-1pm, then 2pm-4:30pm   [place]- Please see attached locations sheet stapled to your lab work with address and hours.   If you have labs (blood work) drawn today and your tests are completely normal, you will receive your results only by: [place] (if you have MyChart) OR A paper copy in the mail If you have any lab test that is abnormal or we need to change your treatment, we will call you to review the results.  Follow-Up: At [facility], you and your health needs are our priority.  As part of our continuing mission to provide you with exceptional heart care, we have created designated Provider Care Teams.  These Care Teams include your primary Cardiologist (physician) and Advanced Practice Providers (APPs -  Physician Assistants and Nurse Practitioners) who all work together to provide you with the care you need, when you need it.  We recommend signing up for the patient portal called "MyChart".  Sign up information is provided on this After Visit Summary.  MyChart is used to connect with patients for Virtual Visits (Telemedicine).  Patients are able to view  lab/test results, encounter notes, upcoming appointments, etc.  Non-urgent messages can be sent to your provider as well.   To learn more about what you can do with MyChart, go to [email or web address].    Your next appointment:   Follow up as scheduled with Dr. Harrell Gave   Other Instructions Heart Healthy Diet Recommendations: A low-salt diet is recommended. Meats should be grilled, baked, or boiled. Avoid fried foods. Focus on lean protein sources like fish or chicken with vegetables and fruits. The American Heart Association is a Microbiologist!  American Heart Association Diet and Lifeystyle Recommendations   Exercise recommendations: The American Heart Association recommends 150 minutes of moderate intensity exercise weekly. Try 30 minutes of moderate intensity exercise 4-5 times per week. This could include walking, jogging, or swimming.   Important Information About Sugar

## 2022-01-21 ENCOUNTER — Other Ambulatory Visit: Payer: Self-pay | Admitting: Surgical

## 2022-01-21 LAB — BASIC METABOLIC PANEL
BUN/Creatinine Ratio: 15 (ref 10–24)
BUN: 14 mg/dL (ref 8–27)
CO2: 21 mmol/L (ref 20–29)
Calcium: 9.9 mg/dL (ref 8.6–10.2)
Chloride: 101 mmol/L (ref 96–106)
Creatinine, Ser: 0.93 mg/dL (ref 0.76–1.27)
Glucose: 106 mg/dL — ABNORMAL HIGH (ref 70–99)
Potassium: 4.7 mmol/L (ref 3.5–5.2)
Sodium: 140 mmol/L (ref 134–144)
eGFR: 94 mL/min/{1.73_m2} (ref >59)

## 2022-02-07 ENCOUNTER — Telehealth: Payer: Self-pay

## 2022-02-07 NOTE — Telephone Encounter (Signed)
Patient called at 6 c/o rt lower leg/ankle swelling he noticed this am. There is some pain when he touches the area.and splotchy redness. No drainage at incision sites. He had a CABG with Dr Kipp Brood on 12/24/21.  I recommended going to the ED or urgent care to have checked. He is scheduled to see PA next Wednesday 02/12/22. Also could call back Monday Am and could work him in then

## 2022-02-10 ENCOUNTER — Ambulatory Visit (INDEPENDENT_AMBULATORY_CARE_PROVIDER_SITE_OTHER): Payer: BC Managed Care – PPO | Admitting: Cardiology

## 2022-02-10 ENCOUNTER — Encounter (HOSPITAL_BASED_OUTPATIENT_CLINIC_OR_DEPARTMENT_OTHER): Payer: Self-pay | Admitting: Cardiology

## 2022-02-10 VITALS — BP 130/66 | HR 64 | Ht 70.0 in | Wt 248.0 lb

## 2022-02-10 DIAGNOSIS — I25118 Atherosclerotic heart disease of native coronary artery with other forms of angina pectoris: Secondary | ICD-10-CM | POA: Diagnosis not present

## 2022-02-10 DIAGNOSIS — Z951 Presence of aortocoronary bypass graft: Secondary | ICD-10-CM

## 2022-02-10 DIAGNOSIS — E7800 Pure hypercholesterolemia, unspecified: Secondary | ICD-10-CM

## 2022-02-10 DIAGNOSIS — I10 Essential (primary) hypertension: Secondary | ICD-10-CM

## 2022-02-10 DIAGNOSIS — I77819 Aortic ectasia, unspecified site: Secondary | ICD-10-CM

## 2022-02-10 DIAGNOSIS — T466X5A Adverse effect of antihyperlipidemic and antiarteriosclerotic drugs, initial encounter: Secondary | ICD-10-CM

## 2022-02-10 DIAGNOSIS — M791 Myalgia, unspecified site: Secondary | ICD-10-CM

## 2022-02-10 NOTE — Patient Instructions (Signed)
Medication Instructions:  Your Physician recommend you continue on your current medication as directed.    *If you need a refill on your cardiac medications before your next appointment, please call your pharmacy*  Follow-Up: At [facility], you and your health needs are our priority.  As part of our continuing mission to provide you with exceptional heart care, we have created designated Provider Care Teams.  These Care Teams include your primary Cardiologist (physician) and Advanced Practice Providers (APPs -  Physician Assistants and Nurse Practitioners) who all work together to provide you with the care you need, when you need it.  We recommend signing up for the patient portal called "MyChart".  Sign up information is provided on this After Visit Summary.  MyChart is used to connect with patients for Virtual Visits (Telemedicine).  Patients are able to view lab/test results, encounter notes, upcoming appointments, etc.  Non-urgent messages can be sent to your provider as well.   To learn more about what you can do with MyChart, go to [email or web address].    Your next appointment:   3 month(s)  The format for your next appointment:   In Person  Provider:   Bridgette Christopher, MD     

## 2022-02-10 NOTE — Progress Notes (Signed)
Cardiology Office Note:    Date:  02/10/2022   ID:  Jonathan Riley, DOB 1960-07-20, MRN [id number]  PCP:  Gaynelle Arabian, MD  Cardiologist:  Buford Dresser, MD  Referring MD: Gaynelle Arabian, MD   CC: Follow-up for dyspnea on exertion  History of Present Illness:    Jonathan Riley is a 61 y.o. male with a hx of hypertension, type II diabetes, hyperlipidemia who is seen for follow-up. He was initially seen 11/14/2021 as a new consult at the request of Gaynelle Arabian, MD for the evaluation and management of dyspnea on exertion.  Cardiovascular risk factors: Comorbid conditions: hypertension--about 7-8 years, hyperlipidemia--was on a statin a few years ago (doesn't recall which one), had joint aches which improved off statin, diabetes--few years. Denies chronic kidney disease  Metabolic syndrome/Obesity: BMI 35 Chronic inflammatory conditions: none Tobacco use history: former smoker, smoked about 23 years, quit, then started back for a few years, then quit again. Family history: father had MI age 61. Maternal grandfather had several MI. Paternal uncle died of MI as well. Maternal grandmother had a stroke. Maternal aunt had a stroke.Brother was a Company secretary, has Parkinson's, HTN, DM. Prior pertinent testing and/or incidental findings: coronary calcium/aortic atherosclerosis seen on CTPE 04/2017. Exercise level: works for [employer], climbs poles. Had knee surgery in December, was out of work for 4 mos, thought the shortness of breath would get better getting back to activity but it has not. Doesn't recall having this before knee surgery.  A coronary CT was ordered, which showed a coronary calcium score of 1633 on 12/03/2021. 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  At his last visit he confirmed DOE. He was usually active at work with frequent climbing and carrying ladders. At one time when finished with  work he had pharyngeal pain associated with tremors. He had been fearful that he was having a heart attack. He was previously intolerant of rosuvastatin due to myalgias. He was started on atorvastatin, metoprolol, and SL NG PRN. Changed aspirin to 81 mg dose. We discussed trialing medical management first vs. going to cath. After shared decision making, will proceed with cath.   Heart catheterization was performed 12/20/2021 and was notable for left main trunk 25 to 30% stenosis and possibly catheter induced dissection/trauma at the time of first injection. Proximal to mid segmental 60 to 70% narrowing. Severely diseased circumflex with 70% first obtuse marginal 80% segmental second marginal and total occlusion of the distal circumflex. High-grade mid RCA stenosis, greater than 95% with TIMI-3 flow.  Proximal RCA and PDA 60 to 70% stenoses. Severe elevation in LVEDP, 26 mmHg.  Systolic function is normal. He underwent 5V CABG on 12/24/21.  Today, the patient states that he is doing well and improving, although not yet returned to 100% baseline. He is able to move around better and he does not become as short of breath like he used to. Since his catheterization, he now only has minor pain at the incisional site when he coughs or sneezes.  One of his main complaints today is erythema and LE edema of his right ankle and shin. He also notices associated burning sensations and pain on palpation with some pruritis. Mostly these symptoms seem to be localized to his frontal right shin superior to his right ankle.  He reports experiencing occasional musculoskeletal soreness on the right side of the chest. This usually wakes him up at night  when it occurs. He believes that it may be due to the way he lies down on his side. However, he states that the discomfort improves throughout the day.   Additionally he endorses occasional ("once or twice") fluttering but this is not constant.  No issues with sleeping habits. He  has now moved to sleeping in his own bed as opposed to in a chair.  He is compliant with Lipitor with no reported issues.   He denies any shortness of breath. No lightheadedness, headaches, syncope, orthopnea, or PND.  Past Medical History:  Diagnosis Date   Arthritis    Depression    Diabetes mellitus without complication ([place])    Dyspnea    "with exertion"   Hypertension    Nocturia    1-2 times during night   PONV (postoperative nausea and vomiting)    "nausea after a knee surgery"    Past Surgical History:  Procedure Laterality Date   COLONOSCOPY     CORONARY ARTERY BYPASS GRAFT N/A 12/24/2021   Procedure: CORONARY ARTERY BYPASS GRAFTING (CABG) X 5 BYPASSES USING OPEN LEFT INTERNAL MAMMARY ARTERY, OPEN LEFT RADIAL ARTERY, AND OPEN RIGHT GREATER SAPHENOUS VEIN HARVEST.;  Surgeon: Lajuana Matte, MD;  Location: [facility];  Service: Open Heart Surgery;  Laterality: N/A;   KNEE SURGERY Bilateral    Right, Left x2   LEFT HEART CATH AND CORONARY ANGIOGRAPHY N/A 12/20/2021   Procedure: LEFT HEART CATH AND CORONARY ANGIOGRAPHY;  Surgeon: Belva Crome, MD;  Location: [facility];  Service: Cardiovascular;  Laterality: N/A;   TEE WITHOUT CARDIOVERSION N/A 12/24/2021   Procedure: TRANSESOPHAGEAL ECHOCARDIOGRAM (TEE);  Surgeon: Lajuana Matte, MD;  Location: [facility];  Service: Open Heart Surgery;  Laterality: N/A;   TOTAL HIP ARTHROPLASTY Right 03/03/2016   Procedure: TOTAL HIP ARTHROPLASTY ANTERIOR APPROACH;  Surgeon: Rod Can, MD;  Location: [facility];  Service: Orthopedics;  Laterality: Right;   WRIST SURGERY Right 2008 or 2009    Current Medications: Current Outpatient Medications on File Prior to Visit  Medication Sig   amLODipine (NORVASC) 10 MG tablet Take 1 tablet (10 mg total) by mouth daily.   aspirin EC 325 MG tablet Take 1 tablet (325 mg total) by mouth daily.   atorvastatin (LIPITOR) 80 MG tablet Take 1 tablet (80 mg total) by mouth daily.   metFORMIN  (GLUCOPHAGE-XR) 500 MG 24 hr tablet Take 500 mg by mouth daily.   metoprolol tartrate (LOPRESSOR) 25 MG tablet Take 1 tablet (25 mg total) by mouth 2 (two) times daily.   sildenafil (REVATIO) 20 MG tablet SMARTSIG:2-5 Tablet(s) By Mouth PRN   telmisartan (MICARDIS) 80 MG tablet Take 80 mg by mouth daily.   No current facility-administered medications on file prior to visit.     Allergies:   Percocet [oxycodone-acetaminophen] and Crestor [rosuvastatin]   Social History   Tobacco Use   Smoking status: Former    Types: Cigarettes   Smokeless tobacco: Never   Tobacco comments:    "quit about 12 years ago" 02/26/2016  Substance Use Topics   Alcohol use: No   Drug use: No    Family History: family history includes Cancer in his unknown relative; Diabetes in his unknown relative; Heart disease in his unknown relative.  ROS:   Please see the history of present illness. (+) RLE edema, burning sensations, and pain with palpation (+) Erythema of right ankle (+) Pruritis (+) Right Chest Pain (+) Palpitations All other systems are reviewed and negative.  EKGs/Labs/Other Studies Reviewed:    The following studies were reviewed today:  Intraoperative TEE  12/24/2021: POST-OP IMPRESSIONS  _ Left Ventricle: Post Bypass:  The patient came off bypass on the initial attempt. There did not appear  to be any new findings from preop findings. Left ventricular contraction did  improve with volume replacement. The TEE that had been placed after induction uneventfully was removed without difficulty. The patient was later taken to the SICU in stable conditon.   PREOPERATIVE VASCULAR EVALUATION  12/21/2021: Summary:  Right Carotid: The extracranial vessels were near-normal with only minimal wall thickening or plaque.   Left Carotid: The extracranial vessels were near-normal with only minimal  wall thickening or plaque.   Vertebrals:  Bilateral vertebral arteries demonstrate antegrade flow.    Subclavians: Normal flow hemodynamics were seen in bilateral subclavian arteries.   Right ABI: Resting right ankle-brachial index is within normal range. The  right toe-brachial index is normal.   Left ABI: Resting left ankle-brachial index is within normal range. The  left toe-brachial index is normal.   Right Upper Extremity: Doppler waveforms remain within normal limits with right radial compression. Doppler waveforms remain within normal limits with right ulnar compression.   Left Upper Extremity: Doppler waveforms remain within normal limits with  left radial compression. Doppler waveforms remain within normal limits  with left ulnar compression.   Left Heart Cath  12/20/2021: CONCLUSIONS: Left main trunk 25 to 30% stenosis and possibly catheter induced dissection/trauma at the time of first injection. Proximal to mid segmental 60 to 70% narrowing. Severely diseased circumflex with 70% first obtuse marginal 80% segmental second marginal and total occlusion of the distal circumflex. High-grade mid RCA stenosis, greater than 95% with TIMI-3 flow.  Proximal RCA and PDA 60 to 70% stenoses. Severe elevation in LVEDP, 26 mmHg.  Systolic function is normal.   Recommendation:   Observation overnight because of left main situation. Consult colleagues and also have TCT [place] to look at the patient's pictures to help develop a treatment plan.  Because of severity of RCA stenosis we need to revascularize in some form or fashion within the next several days to 1 week. Potentially, if left main is a ruptured plaque, we should consider coronary bypass grafting.  If not, consider PCI of RCA and medical treatment of the circumflex territory with clinical observation of left coronary and left main disease. In the meantime, we should treat diastolic heart failure aggressively.  Diagnostic: Dominance: Right   Coronary CTA  12/03/2021: IMPRESSION: 1. Coronary calcium score of 1633. This was 56  percentile for age-, sex, and race-matched controls.   2. Normal coronary origin with right dominance.   3. Moderate (50-69) stenoses in the LAD, Lcx, OM2 and RCA; the distal Lcx appears to be occluded.   4. Dilated ascending aorta; aortic atherosclerosis.   5. Study will be sent for FFR.   RECOMMENDATIONS: CAD-RADS 5: Total coronary occlusion (100%). Consider cardiac catheterization or viability assessment. Consider symptom-guided anti-ischemic pharmacotherapy as well as risk factor modification per guideline directed care.  FFRCT Analysis  12/03/2021: FINDINGS: FFRct analysis was performed on the original cardiac CT angiogram dataset. Diagrammatic representation of the FFRct analysis is provided in a separate PDF document in PACS. This dictation was created using the PDF document and an interactive 3D model of the results. 3D model is not available in the EMR/PACS. Normal FFR range is >0.80.   1. Left Main:   2. LAD: findings 0.81, 0.72, 0.69 very distal vessel  D1 0.86 D2 0.79   3. LCX: findings 0.80, 0.58, CTO   4. Ramus: findings 0.90   5. RCA: findings 0.91, 0.76   IMPRESSION: FFR suggests distal LAD and distal RCA flow limiting but in very distal vessel; mid Lcx lesion flow limiting followed by CTO.  Echo  11/27/2021:  1. Left ventricular ejection fraction, by estimation, is 60 to 65%. The  left ventricle has normal function. The left ventricle has no regional  wall motion abnormalities. There is moderate left ventricular hypertrophy.  Left ventricular diastolic  parameters are indeterminate.   2. Right ventricular systolic function is normal. The right ventricular  size is normal. Tricuspid regurgitation signal is inadequate for assessing  PA pressure.   3. The mitral valve is normal in structure. Trivial mitral valve  regurgitation. No evidence of mitral stenosis.   4. The aortic valve was not well visualized. Aortic valve regurgitation  is trivial. No aortic  stenosis is present.   5. Aortic dilatation noted. There is dilatation of the ascending aorta,  measuring 43 mm.   CTA Chest  05/06/2017: FINDINGS: Cardiovascular: There is mild cardiomegaly. Multi vessel coronary vascular calcification. There is no pericardial effusion. Mild atherosclerotic calcification of the thoracic aorta. Mild the origins of the great vessels of the aortic arch are patent. Evaluation of the pulmonary arteries is limited due to respiratory motion artifact and suboptimal enhancement of the peripheral branches. No large or central pulmonary artery embolus identified.   Mediastinum/Nodes: No hilar or mediastinal adenopathy. The esophagus is grossly unremarkable. No mediastinal fluid collection.   Lungs/Pleura: Right lung base linear atelectatic changes noted. There is no focal consolidation, pleural effusion, or pneumothorax. The central airways are patent.   Upper Abdomen: Diffuse fatty infiltration of the liver. The visualized upper abdomen is otherwise unremarkable.   Musculoskeletal: No chest wall abnormality. No acute or significant osseous findings.   Review of the MIP images confirms the above findings.   IMPRESSION: 1. No acute intrathoracic pathology. No CT evidence of central pulmonary artery embolus. 2. Mild cardiomegaly and coronary vascular calcification. 3. Right lung base linear atelectasis. 4. Fatty liver. 5. Mild Aortic Atherosclerosis (ICD10-I70.0).  EKG:  EKG is personally reviewed.   02/10/2022:  NSR at 64 bpm, Twi inferolateral 12/10/2021:  NSR, PRWP at 64 bpm 11/14/21 Possible ectopic rhythm (unusual p axis), rate 68 bpm, PRWP  Recent Labs: 04/14/2021: ALT 29 12/25/2021: Magnesium 2.2 12/30/2021: Hemoglobin 11.0; Platelets 187 01/20/2022: BUN 14; Creatinine, Ser 0.93; Potassium 4.7; Sodium 140   Recent Lipid Panel    Component Value Date/Time   CHOL 196 12/10/2021 0932   TRIG 246 (H) 12/10/2021 0932   HDL 30 (L) 12/10/2021 0932    CHOLHDL 6.5 (H) 12/10/2021 0932   LDLCALC 122 (H) 12/10/2021 0932    Physical Exam:    VS:  BP 130/66   Pulse 64   Ht 5\' 10"  (1.778 m)   Wt 248 lb (112.5 kg)   BMI 35.58 kg/m     Wt Readings from Last 3 Encounters:  02/10/22 248 lb (112.5 kg)  01/20/22 239 lb 14.4 oz (108.8 kg)  12/30/21 242 lb 4.8 oz (109.9 kg)    GEN: Well nourished, well developed in no acute distress HEENT: Normal, moist mucous membranes NECK: No JVD CARDIAC: regular rhythm, normal S1 and S2, no rubs or gallops. No murmur. VASCULAR: Radial and DP pulses 2+ bilaterally. No carotid bruits RESPIRATORY:  Clear to auscultation without rales, wheezing or rhonchi  ABDOMEN: Soft, non-tender, non-distended  MUSCULOSKELETAL:  Ambulates independently SKIN: Warm and dry, mild RLE edema, Erythema and tenderness on palpation of right frontal shin and right ankle NEUROLOGIC:  Alert and oriented x 3. No focal neuro deficits noted. PSYCHIATRIC:  Normal affect    ASSESSMENT:    1. Coronary artery disease of native artery of native heart with stable angina pectoris ([place])   2. S/P CABG x 5   3. Pure hypercholesterolemia   4. Aortic dilatation (HCC)   5. Myalgia due to statin   6. Essential hypertension     PLAN:    Dyspnea on exertion, Throat pain on exertion-->angina Coronary CT consistent with CTO of Lcx, obstructive CAD S/P 5V CABG 9/023 Hypercholesterolemia Statin myalgia -doing well, awaiting cardiac rehab -continue aspirin, atorvastatin. Had myalgia on rosuvastatin in the past -LDL goal <70, if cannot get to goal would use PCSK9i  Hypertension Aortic dilation -keep BP well controlled -continue metoprolol, telmisartan  Inflamed LE: monitor, if worsens let me or PCP know  Cardiac risk counseling and prevention recommendations: Family history of heart disease -recommend heart healthy/Mediterranean diet, with whole grains, fruits, vegetable, fish, lean meats, nuts, and olive oil. Limit salt. -recommend  moderate walking, 3-5 times/week for 30-50 minutes each session. Aim for at least 150 minutes.week. Goal should be pace of 3 miles/hours, or walking 1.5 miles in 30 minutes -recommend avoidance of tobacco products. Avoid excess alcohol. -ASCVD risk score: The 10-year ASCVD risk score (Arnett DK, et al., 2019) is: 26.1%   Values used to calculate the score:     Age: 60 years     Sex: Male     Is Non-Hispanic African American: No     Diabetic: Yes     Tobacco smoker: No     Systolic Blood Pressure: [id number]     Is BP treated: Yes     HDL Cholesterol: 30 mg/dL     Total Cholesterol: 196 mg/dL    Plan for follow up: 3 months or sooner as needed.  Buford Dresser, MD, PhD, [facility]    Medication Adjustments/Labs and Tests Ordered: Current medicines are reviewed at length with the patient today.  Concerns regarding medicines are outlined above.   Orders Placed This Encounter  Procedures   EKG 12-Lead   No orders of the defined types were placed in this encounter.  Patient Instructions  Medication Instructions:  Your Physician recommend you continue on your current medication as directed.    *If you need a refill on your cardiac medications before your next appointment, please call your pharmacy*  Follow-Up: At [facility], you and your health needs are our priority.  As part of our continuing mission to provide you with exceptional heart care, we have created designated Provider Care Teams.  These Care Teams include your primary Cardiologist (physician) and Advanced Practice Providers (APPs -  Physician Assistants and Nurse Practitioners) who all work together to provide you with the care you need, when you need it.  We recommend signing up for the patient portal called "MyChart".  Sign up information is provided on this After Visit Summary.  MyChart is used to connect with patients for Virtual Visits (Telemedicine).  Patients are able to view  lab/test results, encounter notes, upcoming appointments, etc.  Non-urgent messages can be sent to your provider as well.   To learn more about what you can do with MyChart, go to [email or web address].    Your next appointment:   3 month(s)  The  format for your next appointment:   In Person  Provider:   Buford Dresser, MD      [facility],acting as a scribe for Buford Dresser, MD.,have documented all relevant documentation on the behalf of Buford Dresser, MD,as directed by  Buford Dresser, MD while in the presence of Buford Dresser, MD.  I, Buford Dresser, MD, have reviewed all documentation for this visit. The documentation on 02/10/22 for the exam, diagnosis, procedures, and orders are all accurate and complete.   Signed, Buford Dresser, MD PhD 02/10/2022     [place]

## 2022-02-11 ENCOUNTER — Other Ambulatory Visit: Payer: Self-pay | Admitting: Thoracic Surgery (Cardiothoracic Vascular Surgery)

## 2022-02-11 DIAGNOSIS — Z951 Presence of aortocoronary bypass graft: Secondary | ICD-10-CM

## 2022-02-12 ENCOUNTER — Ambulatory Visit (INDEPENDENT_AMBULATORY_CARE_PROVIDER_SITE_OTHER): Payer: Self-pay | Admitting: Surgical

## 2022-02-12 ENCOUNTER — Ambulatory Visit
Admission: RE | Admit: 2022-02-12 | Discharge: 2022-02-12 | Disposition: A | Discharge status: Home / Self Care | Payer: BC Managed Care – PPO | Source: Ambulatory Visit | Attending: Thoracic Surgery (Cardiothoracic Vascular Surgery) | Admitting: Thoracic Surgery (Cardiothoracic Vascular Surgery)

## 2022-02-12 VITALS — BP 150/74 | HR 78 | Resp 20 | Ht 70.0 in | Wt 249.0 lb

## 2022-02-12 DIAGNOSIS — Z951 Presence of aortocoronary bypass graft: Secondary | ICD-10-CM

## 2022-02-12 NOTE — Progress Notes (Signed)
[address]       [place] [address]             [phone]      Jonathan Riley [place] Medical Record #[id number] Date of Birth: Dec 12, 1960  Referring: Belva Crome, MD Primary Care: Gaynelle Arabian, MD Primary Cardiologist: Buford Dresser, MD   Chief Complaint:   POST OP FOLLOW UP 12/24/2021 Patient:  Jonathan Riley Pre-Op Dx: 3V CAD HTN HLP DM   Post-op Dx:  same Procedure: CABG X 5.  LIMA LAD, L radial to PDA, RSVG OM3, OM1, Diagonal   Endoscopic greater saphenous vein harvest on the right Open left radial artery harvest     Surgeon and Role:      * Lightfoot, Lucile Crater, MD - Primary    * B. Stehler , PA-C - assisting History of Present Illness:    Patient is a 61 year old male status post the above described procedure seen in the office on today's date and routine postsurgical recovery.  Currently he is doing well.  He has resumed driving.  He does not have any significant chest pain but is having fairly mild dyspnea on exertion.  He does have occasional episodes if he gets up quickly or bends over he can have some slight dizziness.  He has some mild sternal discomfort but pain is controlled currently with Tylenol.  He has some mild numbness along the right lower extremity most likely related to saphenous vein harvest.  Generally he is making excellent progress and is pleased with his recovery.      Past Medical History:  Diagnosis Date   Arthritis    Depression    Diabetes mellitus without complication ([place])    Dyspnea    "with exertion"   Hypertension    Nocturia    1-2 times during night   PONV (postoperative nausea and vomiting)    "nausea after a knee surgery"     Social History   Tobacco Use  Smoking Status Former   Types: Cigarettes  Smokeless Tobacco Never  Tobacco Comments   "quit about 12 years ago" 02/26/2016    Social History   Substance and Sexual Activity  Alcohol Use No     Allergies   Allergen Reactions   Percocet [Oxycodone-Acetaminophen] Nausea And Vomiting   Crestor [Rosuvastatin]     Other reaction(s): myalgia/arthralgia (06/2020)    Current Outpatient Medications  Medication Sig Dispense Refill   amLODipine (NORVASC) 10 MG tablet Take 1 tablet (10 mg total) by mouth daily. 30 tablet 1   aspirin EC 325 MG tablet Take 1 tablet (325 mg total) by mouth daily.     atorvastatin (LIPITOR) 80 MG tablet Take 1 tablet (80 mg total) by mouth daily. 30 tablet 1   metFORMIN (GLUCOPHAGE-XR) 500 MG 24 hr tablet Take 500 mg by mouth daily.     metoprolol tartrate (LOPRESSOR) 25 MG tablet Take 1 tablet (25 mg total) by mouth 2 (two) times daily. 60 tablet 1   sildenafil (REVATIO) 20 MG tablet SMARTSIG:2-5 Tablet(s) By Mouth PRN     telmisartan (MICARDIS) 80 MG tablet Take 80 mg by mouth daily.     No current facility-administered medications for this visit.       Physical Exam: BP (!) 150/74   Pulse 78   Resp 20   Ht 5\' 10"  (1.778 m)   Wt 249 lb (112.9 kg)   SpO2 97% Comment: RA  BMI 35.73  kg/m   General appearance: alert, cooperative, and no distress Heart: regular rate and rhythm Lungs: clear to auscultation bilaterally Extremities: Benign Wound: Incisions well-healed without evidence of infection   Diagnostic Studies & Laboratory data:     Recent Radiology Findings:   DG Chest 2 View  Result Date: 02/12/2022 CLINICAL DATA:  Status post recent CABG on 12/24/2021. EXAM: CHEST - 2 VIEW COMPARISON:  12/29/2021 FINDINGS: Normal heart size. There is no evidence of pulmonary edema, consolidation, pneumothorax, nodule or pleural fluid. Visualized bony structures are unremarkable. IMPRESSION: No active cardiopulmonary disease. Electronically Signed   By: Aletta Edouard M.D.   On: 02/12/2022 14:51      Recent Lab Findings: Lab Results  Component Value Date   WBC 8.3 12/30/2021   HGB 11.0 (L) 12/30/2021   HCT 32.3 (L) 12/30/2021   PLT 187 12/30/2021   GLUCOSE  106 (H) 01/20/2022   CHOL 196 12/10/2021   TRIG 246 (H) 12/10/2021   HDL 30 (L) 12/10/2021   LDLCALC 122 (H) 12/10/2021   ALT 29 04/14/2021   AST 21 04/14/2021   NA 140 01/20/2022   K 4.7 01/20/2022   CL 101 01/20/2022   CREATININE 0.93 01/20/2022   BUN 14 01/20/2022   CO2 21 01/20/2022   TSH 0.663 05/07/2017   INR 1.4 (H) 12/24/2021   HGBA1C 7.4 (H) 12/20/2021      Assessment / Plan: Patient continues to do very well.  Discussed activity progression including lifting restrictions and driving protocols.      Medication Changes: No orders of the defined types were placed in this encounter. Patient continues to do well.  His work requires a 12-month visit with the surgeon to be released.  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  We would not ordinarily necessarily need to see the patient as a surgery recovery is going very well and as expected.  I reviewed his chest x-ray and there are no worrisome findings.  I did not make any changes to his current medication regimen.    John Giovanni, PA-C  02/12/2022 2:59 PM

## 2022-02-12 NOTE — Patient Instructions (Signed)
Routine activity progression with described lifting restrictions

## 2022-02-17 ENCOUNTER — Telehealth: Payer: Self-pay | Admitting: *Deleted

## 2022-02-17 NOTE — Telephone Encounter (Signed)
Patient contacted the office inquiring about his RTW date. Patient states he was unaware that he was to return to work on 12/4. Patient states he has a very physically demanding job and believes 12 weeks isn't enough time to recover prior to going back to work. Advised patient that we typically provide 12 weeks for post op recovery. Patient's follow up with Dr. Kipp Brood moved up to review progress and approval of patient going back 12 weeks post-op. STD paper re-mailed to patient's confirmed home address.

## 2022-02-20 ENCOUNTER — Encounter (HOSPITAL_COMMUNITY)
Admission: RE | Admit: 2022-02-20 | Discharge: 2022-02-20 | Disposition: A | Discharge status: Home / Self Care | Payer: BC Managed Care – PPO | Source: Ambulatory Visit | Attending: Cardiology | Admitting: Cardiology

## 2022-02-20 ENCOUNTER — Encounter (HOSPITAL_COMMUNITY): Payer: Self-pay

## 2022-02-20 VITALS — BP 150/70 | HR 73 | Ht 70.0 in | Wt 247.8 lb

## 2022-02-20 DIAGNOSIS — Z951 Presence of aortocoronary bypass graft: Secondary | ICD-10-CM | POA: Insufficient documentation

## 2022-02-20 NOTE — Progress Notes (Signed)
Cardiac Individual Treatment Plan  Patient Details  Name: Jonathan Riley MRN: [id number] Date of Birth: 10/24/1960 Referring Provider:   Flowsheet Row CARDIAC REHAB PHASE II ORIENTATION from 02/20/2022 in [place]  Referring Provider Dr. Kipp Brood       Initial Encounter Date:  Flowsheet Row CARDIAC REHAB PHASE II ORIENTATION from 02/20/2022 in [place]  Date 02/20/22       Visit Diagnosis: S/P CABG x 5  Patient's Home Medications on Admission:  Current Outpatient Medications:    acetaminophen (TYLENOL) 500 MG tablet, Take 1,000 mg by mouth every 6 (six) hours as needed (pain.)., Disp: , Rfl:    amLODipine (NORVASC) 10 MG tablet, Take 1 tablet (10 mg total) by mouth daily., Disp: 30 tablet, Rfl: 1   aspirin EC 325 MG tablet, Take 1 tablet (325 mg total) by mouth daily., Disp: , Rfl:    atorvastatin (LIPITOR) 80 MG tablet, Take 1 tablet (80 mg total) by mouth daily., Disp: 30 tablet, Rfl: 1   diphenhydrAMINE (BENADRYL) 25 mg capsule, Take 25 mg by mouth at bedtime as needed for sleep., Disp: , Rfl:    ibuprofen (ADVIL) 200 MG tablet, Take 400 mg by mouth every 8 (eight) hours as needed (pain.)., Disp: , Rfl:    metFORMIN (GLUCOPHAGE-XR) 500 MG 24 hr tablet, Take 500 mg by mouth daily., Disp: , Rfl:    metoprolol tartrate (LOPRESSOR) 25 MG tablet, Take 1 tablet (25 mg total) by mouth 2 (two) times daily., Disp: 60 tablet, Rfl: 1   sildenafil (REVATIO) 20 MG tablet, Take 40-100 mg by mouth daily as needed (erectile dysfunction)., Disp: , Rfl:    Sodium Bicarbonate (NICE PURE BAKING SODA) POWD, Take 1 Dose by mouth 2 (two) times daily as needed (acid reflux/indigestion). Baking Soda Mixed with water if needed for reflux/indigestion., Disp: , Rfl:    telmisartan (MICARDIS) 80 MG tablet, Take 80 mg by mouth daily., Disp: , Rfl:   Past Medical History: Past Medical History:  Diagnosis Date   Arthritis    Depression    Diabetes  mellitus without complication ([place])    Dyspnea    "with exertion"   Hypertension    Nocturia    1-2 times during night   PONV (postoperative nausea and vomiting)    "nausea after a knee surgery"    Tobacco Use: Social History   Tobacco Use  Smoking Status Former   Types: Cigarettes  Smokeless Tobacco Never  Tobacco Comments   "quit about 12 years ago" 02/26/2016    Labs: Review Flowsheet       Latest Ref Rng & Units 12/10/2021 12/20/2021 12/24/2021 12/25/2021  Labs for ITP Cardiac and Pulmonary Rehab  Cholestrol 100 - 199 mg/dL 196  - - -  LDL (calc) 0 - 99 mg/dL 122  - - -  HDL-C >39 mg/dL 30  - - -  Trlycerides 0 - 149 mg/dL 246  - - -  Hemoglobin A1c 4.8 - 5.6 % - 7.4  - -  PH, Arterial 7.35 - 7.45 - - 7.273  7.257  7.328  7.334  7.326  7.316  7.301  7.329  7.305  7.251  7.306   PCO2 arterial 32 - 48 mmHg - - 42.3  49.4  44.5  40.4  46.0  48.2  51.4  35.3  36.1  40.1  37.3   Bicarbonate 20.0 - 28.0 mmol/L - - 19.5  22.0  23.3  21.5  24.0  24.6  26.2  25.4  18.6  17.9  17.2  18.4   TCO2 22 - 32 mmol/L - - 21  22  23  25  23  23  25  26  26  28  27  24  27  20  19  18  19    Acid-base deficit 0.0 - 2.0 mmol/L - - 7.0  5.0  3.0  4.0  2.0  2.0  1.0  1.0  7.0  8.0  9.0  7.0   O2 Saturation % - - 99  91  100  100  100  100  71  100  97  97  93  91     Capillary Blood Glucose: Lab Results  Component Value Date   GLUCAP 126 (H) 12/30/2021   GLUCAP 153 (H) 12/29/2021   GLUCAP 193 (H) 12/29/2021   GLUCAP 159 (H) 12/29/2021   GLUCAP 134 (H) 12/29/2021     Exercise Target Goals: Exercise Program Goal: Individual exercise prescription set using results from initial 6 min walk test and THRR while considering  patient's activity barriers and safety.   Exercise Prescription Goal: Starting with aerobic activity 30 plus minutes a day, 3 days per week for initial exercise prescription. Provide home exercise prescription and guidelines that participant acknowledges understanding prior  to discharge.  Activity Barriers & Risk Stratification:  Activity Barriers & Cardiac Risk Stratification - 02/20/22 1311       Activity Barriers & Cardiac Risk Stratification   Activity Barriers Left Knee Replacement;Right Hip Replacement;Shortness of Breath    Cardiac Risk Stratification High             6 Minute Walk:  6 Minute Walk     Row Name 02/20/22 1408         6 Minute Walk   Phase Initial     Distance 1950 feet     Walk Time 6 minutes     # of Rest Breaks 0     MPH 3.69     METS 4.69     RPE 11     VO2 Peak 16.43     Symptoms No     Resting HR 73 bpm     Resting BP 150/70     Resting Oxygen Saturation  94 %     Exercise Oxygen Saturation  during 6 min walk 97 %     Max Ex. HR 120 bpm     Max Ex. BP 180/66     2 Minute Post BP 160/62              Oxygen Initial Assessment:   Oxygen Re-Evaluation:   Oxygen Discharge (Final Oxygen Re-Evaluation):   Initial Exercise Prescription:  Initial Exercise Prescription - 02/20/22 1400       Date of Initial Exercise RX and Referring Provider   Date 02/20/22    Referring Provider Dr. Kipp Brood    Expected Discharge Date 05/14/22      Treadmill   MPH 2.5    Grade 0    Minutes 17      NuStep   Level 1    SPM 60    Minutes 22      Prescription Details   Frequency (times per week) 3    Duration Progress to 30 minutes of continuous aerobic without signs/symptoms of physical distress      Intensity   THRR 40-80% of Max Heartrate 64-127    Ratings of Perceived Exertion 11-13  Resistance Training   Training Prescription Yes    Weight 4    Reps 10-15             Perform Capillary Blood Glucose checks as needed.  Exercise Prescription Changes:   Exercise Comments:   Exercise Goals and Review:   Exercise Goals     Row Name 02/20/22 1414             Exercise Goals   Increase Physical Activity Yes       Intervention Provide advice, education, support and counseling  about physical activity/exercise needs.;Develop an individualized exercise prescription for aerobic and resistive training based on initial evaluation findings, risk stratification, comorbidities and participant's personal goals.       Expected Outcomes Short Term: Attend rehab on a regular basis to increase amount of physical activity.;Long Term: Exercising regularly at least 3-5 days a week.;Long Term: Add in home exercise to make exercise part of routine and to increase amount of physical activity.       Increase Strength and Stamina Yes       Intervention Provide advice, education, support and counseling about physical activity/exercise needs.;Develop an individualized exercise prescription for aerobic and resistive training based on initial evaluation findings, risk stratification, comorbidities and participant's personal goals.       Expected Outcomes Short Term: Increase workloads from initial exercise prescription for resistance, speed, and METs.;Short Term: Perform resistance training exercises routinely during rehab and add in resistance training at home;Long Term: Improve cardiorespiratory fitness, muscular endurance and strength as measured by increased METs and functional capacity (6MWT)       Able to understand and use rate of perceived exertion (RPE) scale Yes       Intervention Provide education and explanation on how to use RPE scale       Expected Outcomes Short Term: Able to use RPE daily in rehab to express subjective intensity level;Long Term:  Able to use RPE to guide intensity level when exercising independently       Knowledge and understanding of Target Heart Rate Range (THRR) Yes       Intervention Provide education and explanation of THRR including how the numbers were predicted and where they are located for reference       Expected Outcomes Short Term: Able to state/look up THRR;Long Term: Able to use THRR to govern intensity when exercising independently;Short Term: Able to  use daily as guideline for intensity in rehab       Able to check pulse independently Yes       Intervention Provide education and demonstration on how to check pulse in carotid and radial arteries.;Review the importance of being able to check your own pulse for safety during independent exercise       Expected Outcomes Short Term: Able to explain why pulse checking is important during independent exercise;Long Term: Able to check pulse independently and accurately       Understanding of Exercise Prescription Yes       Intervention Provide education, explanation, and written materials on patient's individual exercise prescription       Expected Outcomes Short Term: Able to explain program exercise prescription;Long Term: Able to explain home exercise prescription to exercise independently                Exercise Goals Re-Evaluation :    Discharge Exercise Prescription (Final Exercise Prescription Changes):   Nutrition:  Target Goals: Understanding of nutrition guidelines, daily intake of sodium 1500mg , cholesterol 200mg ,  calories 30% from fat and 7% or less from saturated fats, daily to have 5 or more servings of fruits and vegetables.  Biometrics:  Pre Biometrics - 02/20/22 1415       Pre Biometrics   Height 5\' 10"  (1.778 m)    Weight 112.4 kg    Waist Circumference 44 inches    Hip Circumference 41 inches    Waist to Hip Ratio 1.07 %    BMI (Calculated) 35.56    Triceps Skinfold 30 mm    % Body Fat 34.7 %    Grip Strength 33 kg    Flexibility 0 in    Single Leg Stand 45 seconds              Nutrition Therapy Plan and Nutrition Goals:  Nutrition Therapy & Goals - 02/20/22 1407       Personal Nutrition Goals   Comments Patient scored 46 on his diet assessment. Handouts on healthier choices and DM information provided and explained. We offer 2 educational sessions on heart healthy nutrition with handouts and assistance with RD referral if patient is interested.       Intervention Plan   Intervention Nutrition handout(s) given to patient.    Expected Outcomes Short Term Goal: Understand basic principles of dietary content, such as calories, fat, sodium, cholesterol and nutrients.             Nutrition Assessments:  Nutrition Assessments - 02/20/22 1406       MEDFICTS Scores   Pre Score 46            MEDIFICTS Score Key: ?70 Need to make dietary changes  40-70 Heart Healthy Diet ? 40 Therapeutic Level Cholesterol Diet   Picture Your Plate Scores: [id number] Unhealthy dietary pattern with much room for improvement. 41-50 Dietary pattern unlikely to meet recommendations for good health and room for improvement. 51-60 More healthful dietary pattern, with some room for improvement.  >60 Healthy dietary pattern, although there may be some specific behaviors that could be improved.    Nutrition Goals Re-Evaluation:   Nutrition Goals Discharge (Final Nutrition Goals Re-Evaluation):   Psychosocial: Target Goals: Acknowledge presence or absence of significant depression and/or stress, maximize coping skills, provide positive support system. Participant is able to verbalize types and ability to use techniques and skills needed for reducing stress and depression.  Initial Review & Psychosocial Screening:  Initial Psych Review & Screening - 02/20/22 1413       Initial Review   Current issues with Current Stress Concerns    Source of Stress Concerns Financial;Retirement/disability    Comments Patient has had a diffcult time working with his company regarding his short term disability and he says this has been very stressful for him.      Family Dynamics   Good Support System? Yes      Barriers   Psychosocial barriers to participate in program The patient should benefit from training in stress management and relaxation.;There are no identifiable barriers or psychosocial needs.      Screening Interventions   Interventions Encouraged to  exercise;To provide support and resources with identified psychosocial needs;Provide feedback about the scores to participant    Expected Outcomes Short Term goal: Identification and review with participant of any Quality of Life or Depression concerns found by scoring the questionnaire.             Quality of Life Scores:  Quality of Life - 02/20/22 1416  Quality of Life   Select Quality of Life      Quality of Life Scores   Health/Function Pre 15.4 %    Socioeconomic Pre 16.5 %    Psych/Spiritual Pre 14.71 %    Family Pre 17 %    GLOBAL Pre 15.74 %            Scores of 19 and below usually indicate a poorer quality of life in these areas.  A difference of  2-3 points is a clinically meaningful difference.  A difference of 2-3 points in the total score of the Quality of Life Index has been associated with significant improvement in overall quality of life, self-image, physical symptoms, and general health in studies assessing change in quality of life.  PHQ-9: Review Flowsheet       02/20/2022 02/12/2017  Depression screen PHQ 2/9  Decreased Interest 2 0  Down, Depressed, Hopeless 1 0  PHQ - 2 Score 3 0  Altered sleeping 3 -  Tired, decreased energy 2 -  Change in appetite 3 -  Feeling bad or failure about yourself  1 -  Trouble concentrating 1 -  Moving slowly or fidgety/restless 0 -  Suicidal thoughts 0 -  PHQ-9 Score 13 -  Difficult doing work/chores Not difficult at all -   Interpretation of Total Score  Total Score Depression Severity:  1-4 = Minimal depression, 5-9 = Mild depression, 10-14 = Moderate depression, 15-19 = Moderately severe depression, 20-27 = Severe depression   Psychosocial Evaluation and Intervention:   Psychosocial Re-Evaluation:  Psychosocial Re-Evaluation     [place] Name 02/20/22 1416             Psychosocial Re-Evaluation   Current issues with Current Stress Concerns;Current Sleep Concerns       Comments Patient has no  psychosocial barriers to participate in CR identified at his orientation visit. He will not be able to complete all 12 weeks due to returning to work. He is not sure what that date is but possibly 12/4 but he wants to do as much of the program as he can. His initial PHQ-9 score was 13 and overall QOL was 15.74%. He says he has had a lot of health issues in the past few years. 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 He says he does feel depressed at times and his wife has noticed a change in his mood and personality and wanted him to see a therapist which he has made an appointment. He reports having a very difficult time with the complany he works with getting short term disability which has been a stressor for him. He is also concerned about being released to return to work on 12/4. 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 He has an appointment with Dr. Kipp Brood 12/1 to discuss his return to work and I encouraged him to share his concerns with Dr. Kipp Brood. He agreed he would. He has a great support system with his wife. He is ready to start the program and wants to do as much of it as he is able before returning to work.       Expected Outcomes Patient will continue to have no psychosocial barriers identified.  Interventions Stress management education;Encouraged to attend Cardiac Rehabilitation for the exercise;Relaxation education       Continue Psychosocial Services  No Follow up required         Initial Review   Source of Stress Concerns Financial;Retirement/disability       Comments Patient has had a diffcult time working with his company regarding his short term disability and he says this has been very stressful for him.                Psychosocial Discharge  (Final Psychosocial Re-Evaluation):  Psychosocial Re-Evaluation - 02/20/22 1416       Psychosocial Re-Evaluation   Current issues with Current Stress Concerns;Current Sleep Concerns    Comments Patient has no psychosocial barriers to participate in CR identified at his orientation visit. He will not be able to complete all 12 weeks due to returning to work. He is not sure what that date is but possibly 12/4 but he wants to do as much of the program as he can. His initial PHQ-9 score was 13 and overall QOL was 15.74%. He says he has had a lot of health issues in the past few years. 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 He says he does feel depressed at times and his wife has noticed a change in his mood and personality and wanted him to see a therapist which he has made an appointment. He reports having a very difficult time with the complany he works with getting short term disability which has been a stressor for him. He is also concerned about being released to return to work on 12/4. 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 He has an appointment with Dr. Cliffton Asters 12/1 to discuss his return to work and I encouraged him to share his concerns with Dr. Cliffton Asters. He agreed he would. He has a great support system with his wife. He is ready to start the program and wants to do as much of it as he is able before returning to work.    Expected Outcomes Patient will continue to have no psychosocial barriers identified.    Interventions Stress management education;Encouraged to attend Cardiac Rehabilitation for the exercise;Relaxation education    Continue Psychosocial Services  No Follow up required      Initial Review   Source of Stress Concerns Financial;Retirement/disability     Comments Patient has had a diffcult time working with his company regarding his short term disability and he says this has been very stressful for him.             Vocational Rehabilitation: Provide vocational rehab assistance to qualifying candidates.   Vocational Rehab Evaluation & Intervention:  Vocational Rehab - 02/20/22 1410       Initial Vocational Rehab Evaluation & Intervention   Assessment shows need for Vocational Rehabilitation No      Vocational Rehab Re-Evaulation   Comments Patient plans to return to work at his previous job with [employer].             Education: Education Goals: Education classes will be provided on a weekly basis, covering required topics. Participant will state understanding/return demonstration of topics presented.  Learning Barriers/Preferences:  Learning Barriers/Preferences - 02/20/22 1409  Learning Barriers/Preferences   Learning Preferences Written Material;Video;Audio             Education Topics: Hypertension, Hypertension Reduction -Define heart disease and high blood pressure. Discus how high blood pressure affects the body and ways to reduce high blood pressure.   Exercise and Your Heart -Discuss why it is important to exercise, the FITT principles of exercise, normal and abnormal responses to exercise, and how to exercise safely.   Angina -Discuss definition of angina, causes of angina, treatment of angina, and how to decrease risk of having angina.   Cardiac Medications -Review what the following cardiac medications are used for, how they affect the body, and side effects that may occur when taking the medications.  Medications include Aspirin, Beta blockers, calcium channel blockers, ACE Inhibitors, angiotensin receptor blockers, diuretics, digoxin, and antihyperlipidemics.   Congestive Heart Failure -Discuss the definition of CHF, how to live with CHF, the signs and symptoms of CHF, and how keep track of  weight and sodium intake.   Heart Disease and Intimacy -Discus the effect sexual activity has on the heart, how changes occur during intimacy as we age, and safety during sexual activity.   Smoking Cessation / COPD -Discuss different methods to quit smoking, the health benefits of quitting smoking, and the definition of COPD.   Nutrition I: Fats -Discuss the types of cholesterol, what cholesterol does to the heart, and how cholesterol levels can be controlled.   Nutrition II: Labels -Discuss the different components of food labels and how to read food label   Heart Parts/Heart Disease and PAD -Discuss the anatomy of the heart, the pathway of blood circulation through the heart, and these are affected by heart disease.   Stress I: Signs and Symptoms -Discuss the causes of stress, how stress may lead to anxiety and depression, and ways to limit stress.   Stress II: Relaxation -Discuss different types of relaxation techniques to limit stress.   Warning Signs of Stroke / TIA -Discuss definition of a stroke, what the signs and symptoms are of a stroke, and how to identify when someone is having stroke.   Knowledge Questionnaire Score:  Knowledge Questionnaire Score - 02/20/22 1410       Knowledge Questionnaire Score   Pre Score 21/24             Core Components/Risk Factors/Patient Goals at Admission:  Personal Goals and Risk Factors at Admission - 02/20/22 1411       Core Components/Risk Factors/Patient Goals on Admission    Weight Management Weight Maintenance    Improve shortness of breath with ADL's Yes    Intervention Provide education, individualized exercise plan and daily activity instruction to help decrease symptoms of SOB with activities of daily living.    Expected Outcomes Short Term: Improve cardiorespiratory fitness to achieve a reduction of symptoms when performing ADLs;Long Term: Be able to perform more ADLs without symptoms or delay the onset of  symptoms    Diabetes Yes    Intervention Provide education about signs/symptoms and action to take for hypo/hyperglycemia.;Provide education about proper nutrition, including hydration, and aerobic/resistive exercise prescription along with prescribed medications to achieve blood glucose in normal ranges: Fasting glucose 65-99 mg/dL    Expected Outcomes Short Term: Participant verbalizes understanding of the signs/symptoms and immediate care of hyper/hypoglycemia, proper foot care and importance of medication, aerobic/resistive exercise and nutrition plan for blood glucose control.;Long Term: Attainment of HbA1C < 7%.    Hypertension Yes    Intervention  Provide education on lifestyle modifcations including regular physical activity/exercise, weight management, moderate sodium restriction and increased consumption of fresh fruit, vegetables, and low fat dairy, alcohol moderation, and smoking cessation.;Monitor prescription use compliance.    Expected Outcomes Short Term: Continued assessment and intervention until BP is < 140/79mm HG in hypertensive participants. < 130/73mm HG in hypertensive participants with diabetes, heart failure or chronic kidney disease.;Long Term: Maintenance of blood pressure at goal levels.    Stress Yes    Intervention Refer participants experiencing significant psychosocial distress to appropriate mental health specialists for further evaluation and treatment. When possible, include family members and significant others in education/counseling sessions.    Expected Outcomes Short Term: Participant demonstrates changes in health-related behavior, relaxation and other stress management skills, ability to obtain effective social support, and compliance with psychotropic medications if prescribed.    Personal Goal Other Yes    Personal Goal Patient wants to decrease his SOB with exertion; improve his muscle strength; and get back to running again.    Intervention Patient will  attend CR 3 days/week with exercise and education.    Expected Outcomes Patient will participate in the program until he returns to work meeting both personal and program goals.             Core Components/Risk Factors/Patient Goals Review:    Core Components/Risk Factors/Patient Goals at Discharge (Final Review):    ITP Comments:   Comments: Patient arrived for 1st visit/orientation/education at 1230. Patient was referred to CR by Dr. Melodie Bouillon, MD due to S/P CABGx5 (Z95.1). During orientation advised patient on arrival and appointment times what to wear, what to do before, during and after exercise. Reviewed attendance and class policy.  Pt is scheduled to return Cardiac Rehab on 02/24/22 at 930. Pt was advised to come to class 15 minutes before class starts.  Discussed RPE/Dpysnea scales. Patient participated in warm up stretches. Patient was able to complete 6 minute walk test.  Telemetry:NSR. Patient was measured for the equipment. Discussed equipment safety with patient. Took patient pre-anthropometric measurements. Patient finished visit at 1340.

## 2022-02-24 ENCOUNTER — Encounter (HOSPITAL_COMMUNITY)
Admission: RE | Admit: 2022-02-24 | Discharge: 2022-02-24 | Disposition: A | Discharge status: Home / Self Care | Payer: BC Managed Care – PPO | Source: Ambulatory Visit | Attending: Thoracic Surgery (Cardiothoracic Vascular Surgery) | Admitting: Thoracic Surgery (Cardiothoracic Vascular Surgery)

## 2022-02-24 VITALS — Wt 250.2 lb

## 2022-02-24 DIAGNOSIS — Z951 Presence of aortocoronary bypass graft: Secondary | ICD-10-CM | POA: Diagnosis not present

## 2022-02-24 NOTE — Progress Notes (Signed)
Daily Session Note  Patient Details  Name: Jonathan Riley MRN: [id number] Date of Birth: 1960/08/14 Referring Provider:   Flowsheet Row CARDIAC REHAB PHASE II ORIENTATION from 02/20/2022 in [place]  Referring Provider Dr. Kipp Brood       Encounter Date: 02/24/2022  Check In:  Session Check In - 02/24/22 0930       Check-In   Supervising physician immediately available to respond to emergencies [facility] MD immediately available    Physician(s) Dr. Dellia Cloud    Location [facility]    Staff Present Aundra Dubin, RN, Joanette Gula, RN, BSN;Dalton Sherrie George, MS, ACSM-CEP;Geanie Cooley, RN;Heather Mel Almond, [place], Exercise Physiologist    Virtual Visit No    Medication changes reported     No    Fall or balance concerns reported    No    Tobacco Cessation No Change    Warm-up and Cool-down Performed as group-led instruction    Resistance Training Performed Yes    VAD Patient? No    PAD/SET Patient? No      Pain Assessment   Currently in Pain? No/denies    Pain Score 0-No pain    Multiple Pain Sites No             Capillary Blood Glucose: No results found for this or any previous visit (from the past 24 hour(s)).    Social History   Tobacco Use  Smoking Status Former   Types: Cigarettes  Smokeless Tobacco Never  Tobacco Comments   "quit about 12 years ago" 02/26/2016    Goals Met:  Exercise tolerated well No report of concerns or symptoms today Strength training completed today  Goals Unmet:  Not Applicable  Comments: check out @ 10:30am   Dr. Carlyle Dolly is Medical Director for [facility]

## 2022-02-26 ENCOUNTER — Other Ambulatory Visit: Payer: Self-pay | Admitting: Surgical

## 2022-02-26 ENCOUNTER — Encounter (HOSPITAL_COMMUNITY)
Admission: RE | Admit: 2022-02-26 | Discharge: 2022-02-26 | Disposition: A | Discharge status: Home / Self Care | Payer: BC Managed Care – PPO | Source: Ambulatory Visit | Attending: Thoracic Surgery (Cardiothoracic Vascular Surgery) | Admitting: Thoracic Surgery (Cardiothoracic Vascular Surgery)

## 2022-02-26 DIAGNOSIS — Z951 Presence of aortocoronary bypass graft: Secondary | ICD-10-CM

## 2022-02-26 NOTE — Progress Notes (Signed)
Cardiac Individual Treatment Plan  Patient Details  Name: Jonathan Riley MRN: [id number] Date of Birth: 06-14-1960 Referring Provider:   Flowsheet Row CARDIAC REHAB PHASE II ORIENTATION from 02/20/2022 in [facility]  Referring Provider Dr. Kipp Brood       Initial Encounter Date:  Flowsheet Row CARDIAC REHAB PHASE II ORIENTATION from 02/20/2022 in [facility]  Date 02/20/22       Visit Diagnosis: S/P CABG x 5  Patient's Home Medications on Admission:  Current Outpatient Medications:    acetaminophen (TYLENOL) 500 MG tablet, Take 1,000 mg by mouth every 6 (six) hours as needed (pain.)., Disp: , Rfl:    amLODipine (NORVASC) 10 MG tablet, Take 1 tablet (10 mg total) by mouth daily., Disp: 30 tablet, Rfl: 1   aspirin EC 325 MG tablet, Take 1 tablet (325 mg total) by mouth daily., Disp: , Rfl:    atorvastatin (LIPITOR) 80 MG tablet, Take 1 tablet (80 mg total) by mouth daily., Disp: 30 tablet, Rfl: 1   diphenhydrAMINE (BENADRYL) 25 mg capsule, Take 25 mg by mouth at bedtime as needed for sleep., Disp: , Rfl:    ibuprofen (ADVIL) 200 MG tablet, Take 400 mg by mouth every 8 (eight) hours as needed (pain.)., Disp: , Rfl:    metFORMIN (GLUCOPHAGE-XR) 500 MG 24 hr tablet, Take 500 mg by mouth daily., Disp: , Rfl:    metoprolol tartrate (LOPRESSOR) 25 MG tablet, Take 1 tablet (25 mg total) by mouth 2 (two) times daily., Disp: 60 tablet, Rfl: 1   sildenafil (REVATIO) 20 MG tablet, Take 40-100 mg by mouth daily as needed (erectile dysfunction)., Disp: , Rfl:    Sodium Bicarbonate (NICE PURE BAKING SODA) POWD, Take 1 Dose by mouth 2 (two) times daily as needed (acid reflux/indigestion). Baking Soda Mixed with water if needed for reflux/indigestion., Disp: , Rfl:    telmisartan (MICARDIS) 80 MG tablet, Take 80 mg by mouth daily., Disp: , Rfl:   Past Medical History: Past Medical History:  Diagnosis Date   Arthritis    Depression    Diabetes  mellitus without complication ([place])    Dyspnea    "with exertion"   Hypertension    Nocturia    1-2 times during night   PONV (postoperative nausea and vomiting)    "nausea after a knee surgery"    Tobacco Use: Social History   Tobacco Use  Smoking Status Former   Types: Cigarettes  Smokeless Tobacco Never  Tobacco Comments   "quit about 12 years ago" 02/26/2016    Labs: Review Flowsheet       Latest Ref Rng & Units 12/10/2021 12/20/2021 12/24/2021 12/25/2021  Labs for ITP Cardiac and Pulmonary Rehab  Cholestrol 100 - 199 mg/dL 196  - - -  LDL (calc) 0 - 99 mg/dL 122  - - -  HDL-C >39 mg/dL 30  - - -  Trlycerides 0 - 149 mg/dL 246  - - -  Hemoglobin A1c 4.8 - 5.6 % - 7.4  - -  PH, Arterial 7.35 - 7.45 - - 7.273  7.257  7.328  7.334  7.326  7.316  7.301  7.329  7.305  7.251  7.306   PCO2 arterial 32 - 48 mmHg - - 42.3  49.4  44.5  40.4  46.0  48.2  51.4  35.3  36.1  40.1  37.3   Bicarbonate 20.0 - 28.0 mmol/L - - 19.5  22.0  23.3  21.5  24.0  24.6  26.2  25.4  18.6  17.9  17.2  18.4   TCO2 22 - 32 mmol/L - - 21  22  23  25  23  23  25  26  26  28  27  24  27  20  19  18  19    Acid-base deficit 0.0 - 2.0 mmol/L - - 7.0  5.0  3.0  4.0  2.0  2.0  1.0  1.0  7.0  8.0  9.0  7.0   O2 Saturation % - - 99  91  100  100  100  100  71  100  97  97  93  91     Capillary Blood Glucose: Lab Results  Component Value Date   GLUCAP 126 (H) 12/30/2021   GLUCAP 153 (H) 12/29/2021   GLUCAP 193 (H) 12/29/2021   GLUCAP 159 (H) 12/29/2021   GLUCAP 134 (H) 12/29/2021     Exercise Target Goals: Exercise Program Goal: Individual exercise prescription set using results from initial 6 min walk test and THRR while considering  patient's activity barriers and safety.   Exercise Prescription Goal: Starting with aerobic activity 30 plus minutes a day, 3 days per week for initial exercise prescription. Provide home exercise prescription and guidelines that participant acknowledges understanding prior  to discharge.  Activity Barriers & Risk Stratification:  Activity Barriers & Cardiac Risk Stratification - 02/20/22 1311       Activity Barriers & Cardiac Risk Stratification   Activity Barriers Left Knee Replacement;Right Hip Replacement;Shortness of Breath    Cardiac Risk Stratification High             6 Minute Walk:  6 Minute Walk     Row Name 02/20/22 1408         6 Minute Walk   Phase Initial     Distance 1950 feet     Walk Time 6 minutes     # of Rest Breaks 0     MPH 3.69     METS 4.69     RPE 11     VO2 Peak 16.43     Symptoms No     Resting HR 73 bpm     Resting BP 150/70     Resting Oxygen Saturation  94 %     Exercise Oxygen Saturation  during 6 min walk 97 %     Max Ex. HR 120 bpm     Max Ex. BP 180/66     2 Minute Post BP 160/62              Oxygen Initial Assessment:   Oxygen Re-Evaluation:   Oxygen Discharge (Final Oxygen Re-Evaluation):   Initial Exercise Prescription:  Initial Exercise Prescription - 02/20/22 1400       Date of Initial Exercise RX and Referring Provider   Date 02/20/22    Referring Provider Dr. Kipp Brood    Expected Discharge Date 05/14/22      Treadmill   MPH 2.5    Grade 0    Minutes 17      NuStep   Level 1    SPM 60    Minutes 22      Prescription Details   Frequency (times per week) 3    Duration Progress to 30 minutes of continuous aerobic without signs/symptoms of physical distress      Intensity   THRR 40-80% of Max Heartrate 64-127    Ratings of Perceived Exertion 11-13  Resistance Training   Training Prescription Yes    Weight 4    Reps 10-15             Perform Capillary Blood Glucose checks as needed.  Exercise Prescription Changes:   Exercise Prescription Changes     Row Name 02/24/22 1500             Response to Exercise   Blood Pressure (Admit) 128/74       Blood Pressure (Exercise) 160/60       Blood Pressure (Exit) 130/68       Heart Rate (Admit) 67 bpm        Heart Rate (Exercise) 104 bpm       Heart Rate (Exit) 76 bpm       Rating of Perceived Exertion (Exercise) 12       Duration Continue with 30 min of aerobic exercise without signs/symptoms of physical distress.       Intensity THRR unchanged         Progression   Progression Continue to progress workloads to maintain intensity without signs/symptoms of physical distress.         Resistance Training   Training Prescription Yes       Weight 4       Reps 10-15       Time 10 Minutes         Treadmill   MPH 3       Grade 0       Minutes 17       METs 3.3         NuStep   Level 3       SPM 157       Minutes 22       METs 7.81                Exercise Comments:   Exercise Goals and Review:   Exercise Goals     Row Name 02/20/22 1414 02/24/22 1510           Exercise Goals   Increase Physical Activity Yes Yes      Intervention Provide advice, education, support and counseling about physical activity/exercise needs.;Develop an individualized exercise prescription for aerobic and resistive training based on initial evaluation findings, risk stratification, comorbidities and participant's personal goals. Provide advice, education, support and counseling about physical activity/exercise needs.;Develop an individualized exercise prescription for aerobic and resistive training based on initial evaluation findings, risk stratification, comorbidities and participant's personal goals.      Expected Outcomes Short Term: Attend rehab on a regular basis to increase amount of physical activity.;Long Term: Exercising regularly at least 3-5 days a week.;Long Term: Add in home exercise to make exercise part of routine and to increase amount of physical activity. Short Term: Attend rehab on a regular basis to increase amount of physical activity.;Long Term: Exercising regularly at least 3-5 days a week.;Long Term: Add in home exercise to make exercise part of routine and to increase  amount of physical activity.      Increase Strength and Stamina Yes Yes      Intervention Provide advice, education, support and counseling about physical activity/exercise needs.;Develop an individualized exercise prescription for aerobic and resistive training based on initial evaluation findings, risk stratification, comorbidities and participant's personal goals. Provide advice, education, support and counseling about physical activity/exercise needs.;Develop an individualized exercise prescription for aerobic and resistive training based on initial evaluation findings, risk stratification, comorbidities and participant's personal goals.  Expected Outcomes Short Term: Increase workloads from initial exercise prescription for resistance, speed, and METs.;Short Term: Perform resistance training exercises routinely during rehab and add in resistance training at home;Long Term: Improve cardiorespiratory fitness, muscular endurance and strength as measured by increased METs and functional capacity (6MWT) Short Term: Increase workloads from initial exercise prescription for resistance, speed, and METs.;Short Term: Perform resistance training exercises routinely during rehab and add in resistance training at home;Long Term: Improve cardiorespiratory fitness, muscular endurance and strength as measured by increased METs and functional capacity (6MWT)      Able to understand and use rate of perceived exertion (RPE) scale Yes Yes      Intervention Provide education and explanation on how to use RPE scale Provide education and explanation on how to use RPE scale      Expected Outcomes Short Term: Able to use RPE daily in rehab to express subjective intensity level;Long Term:  Able to use RPE to guide intensity level when exercising independently Short Term: Able to use RPE daily in rehab to express subjective intensity level;Long Term:  Able to use RPE to guide intensity level when exercising independently       Knowledge and understanding of Target Heart Rate Range (THRR) Yes Yes      Intervention Provide education and explanation of THRR including how the numbers were predicted and where they are located for reference Provide education and explanation of THRR including how the numbers were predicted and where they are located for reference      Expected Outcomes Short Term: Able to state/look up THRR;Long Term: Able to use THRR to govern intensity when exercising independently;Short Term: Able to use daily as guideline for intensity in rehab Short Term: Able to state/look up THRR;Long Term: Able to use THRR to govern intensity when exercising independently;Short Term: Able to use daily as guideline for intensity in rehab      Able to check pulse independently Yes Yes      Intervention Provide education and demonstration on how to check pulse in carotid and radial arteries.;Review the importance of being able to check your own pulse for safety during independent exercise Provide education and demonstration on how to check pulse in carotid and radial arteries.;Review the importance of being able to check your own pulse for safety during independent exercise      Expected Outcomes Short Term: Able to explain why pulse checking is important during independent exercise;Long Term: Able to check pulse independently and accurately Short Term: Able to explain why pulse checking is important during independent exercise;Long Term: Able to check pulse independently and accurately      Understanding of Exercise Prescription Yes Yes      Intervention Provide education, explanation, and written materials on patient's individual exercise prescription Provide education, explanation, and written materials on patient's individual exercise prescription      Expected Outcomes Short Term: Able to explain program exercise prescription;Long Term: Able to explain home exercise prescription to exercise independently Short Term: Able to  explain program exercise prescription;Long Term: Able to explain home exercise prescription to exercise independently               Exercise Goals Re-Evaluation :  Exercise Goals Re-Evaluation     Row Name 02/24/22 1510             Exercise Goal Re-Evaluation   Exercise Goals Review Increase Physical Activity;Able to understand and use rate of perceived exertion (RPE) scale;Increase Strength and Stamina;Knowledge and understanding of  Target Heart Rate Range (THRR);Able to check pulse independently;Understanding of Exercise Prescription       Comments Pt has completed his first session of cardiac rehab. He is very motivated and ready to get started. He is currently exercising at 7.81 METs on the stepper. Will contineut to monitor and progress as able.       Expected Outcomes Through exercise at rehab and home, the patient will meet their stated goals.                 Discharge Exercise Prescription (Final Exercise Prescription Changes):  Exercise Prescription Changes - 02/24/22 1500       Response to Exercise   Blood Pressure (Admit) 128/74    Blood Pressure (Exercise) 160/60    Blood Pressure (Exit) 130/68    Heart Rate (Admit) 67 bpm    Heart Rate (Exercise) 104 bpm    Heart Rate (Exit) 76 bpm    Rating of Perceived Exertion (Exercise) 12    Duration Continue with 30 min of aerobic exercise without signs/symptoms of physical distress.    Intensity THRR unchanged      Progression   Progression Continue to progress workloads to maintain intensity without signs/symptoms of physical distress.      Resistance Training   Training Prescription Yes    Weight 4    Reps 10-15    Time 10 Minutes      Treadmill   MPH 3    Grade 0    Minutes 17    METs 3.3      NuStep   Level 3    SPM 157    Minutes 22    METs 7.81             Nutrition:  Target Goals: Understanding of nutrition guidelines, daily intake of sodium 1500mg , cholesterol 200mg , calories 30%  from fat and 7% or less from saturated fats, daily to have 5 or more servings of fruits and vegetables.  Biometrics:  Pre Biometrics - 02/20/22 1415       Pre Biometrics   Height 5\' 10"  (1.778 m)    Weight 112.4 kg    Waist Circumference 44 inches    Hip Circumference 41 inches    Waist to Hip Ratio 1.07 %    BMI (Calculated) 35.56    Triceps Skinfold 30 mm    % Body Fat 34.7 %    Grip Strength 33 kg    Flexibility 0 in    Single Leg Stand 45 seconds              Nutrition Therapy Plan and Nutrition Goals:  Nutrition Therapy & Goals - 02/20/22 1407       Personal Nutrition Goals   Comments Patient scored 46 on his diet assessment. Handouts on healthier choices and DM information provided and explained. We offer 2 educational sessions on heart healthy nutrition with handouts and assistance with RD referral if patient is interested.      Intervention Plan   Intervention Nutrition handout(s) given to patient.    Expected Outcomes Short Term Goal: Understand basic principles of dietary content, such as calories, fat, sodium, cholesterol and nutrients.             Nutrition Assessments:  Nutrition Assessments - 02/20/22 1406       MEDFICTS Scores   Pre Score 46            MEDIFICTS Score Key: ?70 Need to make dietary changes  40-70 Heart Healthy Diet ? 40 Therapeutic Level Cholesterol Diet   Picture Your Plate Scores: [id number] Unhealthy dietary pattern with much room for improvement. 41-50 Dietary pattern unlikely to meet recommendations for good health and room for improvement. 51-60 More healthful dietary pattern, with some room for improvement.  >60 Healthy dietary pattern, although there may be some specific behaviors that could be improved.    Nutrition Goals Re-Evaluation:   Nutrition Goals Discharge (Final Nutrition Goals Re-Evaluation):   Psychosocial: Target Goals: Acknowledge presence or absence of significant depression and/or stress,  maximize coping skills, provide positive support system. Participant is able to verbalize types and ability to use techniques and skills needed for reducing stress and depression.  Initial Review & Psychosocial Screening:  Initial Psych Review & Screening - 02/20/22 1413       Initial Review   Current issues with Current Stress Concerns      Family Dynamics   Good Support System? Yes      Barriers   Psychosocial barriers to participate in program The patient should benefit from training in stress management and relaxation.;There are no identifiable barriers or psychosocial needs.      Screening Interventions   Interventions Encouraged to exercise;To provide support and resources with identified psychosocial needs;Provide feedback about the scores to participant    Expected Outcomes Short Term goal: Identification and review with participant of any Quality of Life or Depression concerns found by scoring the questionnaire.             Quality of Life Scores:  Quality of Life - 02/20/22 1416       Quality of Life   Select Quality of Life      Quality of Life Scores   Health/Function Pre 15.4 %    Socioeconomic Pre 16.5 %    Psych/Spiritual Pre 14.71 %    Family Pre 17 %    GLOBAL Pre 15.74 %            Scores of 19 and below usually indicate a poorer quality of life in these areas.  A difference of  2-3 points is a clinically meaningful difference.  A difference of 2-3 points in the total score of the Quality of Life Index has been associated with significant improvement in overall quality of life, self-image, physical symptoms, and general health in studies assessing change in quality of life.  PHQ-9: Review Flowsheet       02/20/2022 02/12/2017  Depression screen PHQ 2/9  Decreased Interest 2 0  Down, Depressed, Hopeless 1 0  PHQ - 2 Score 3 0  Altered sleeping 3 -  Tired, decreased energy 2 -  Change in appetite 3 -  Feeling bad or failure about yourself  1 -   Trouble concentrating 1 -  Moving slowly or fidgety/restless 0 -  Suicidal thoughts 0 -  PHQ-9 Score 13 -  Difficult doing work/chores Not difficult at all -   Interpretation of Total Score  Total Score Depression Severity:  1-4 = Minimal depression, 5-9 = Mild depression, 10-14 = Moderate depression, 15-19 = Moderately severe depression, 20-27 = Severe depression   Psychosocial Evaluation and Intervention:  Psychosocial Evaluation - 02/21/22 1338       Psychosocial Evaluation & Interventions   Interventions Stress management education;Relaxation education;Encouraged to exercise with the program and follow exercise prescription    Comments Patient has no psychosocial barriers to participate in CR identified at his orientation visit. He will not be able to complete  all 12 weeks due to returning to work. He is not sure what that date is but possibly 12/4 but he wants to do as much of the program as he can. His initial PHQ-9 score was 13 and overall QOL was 15.74%. He says he has had a lot of health issues in the past few years. 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 He says he does feel depressed at times and his wife has noticed a change in his mood and personality and wanted him to see a therapist which he has made an appointment. He reports having a very difficult time with the company he works with getting short term disability which has been a stressor for him. He is also concerned about being released to return to work on 12/4. 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 He has an appointment with Dr. Kipp Brood 12/1 to discuss his return to work and I encouraged him to share his concerns with Dr. Kipp Brood. He agreed he would. He has a great support system with his wife. He  is ready to start the program and wants to do as much of it as he is able before returning to work.    Expected Outcomes Patient will continue to have no psychosocial barriers identified.    Continue Psychosocial Services  No Follow up required             Psychosocial Re-Evaluation:  Psychosocial Re-Evaluation     [place] Name 02/20/22 1413 02/20/22 1416 02/21/22 1339         Psychosocial Re-Evaluation   Current issues with -- -- Current Stress Concerns     Comments -- -- Patient is new the program and plans to start Monday 11/6. We will continue to monitor his progress in the program.     Expected Outcomes -- -- Patient will continue to have no psychosocial barriers identified.     Interventions -- -- Stress management education;Encouraged to attend Cardiac Rehabilitation for the exercise;Relaxation education     Continue Psychosocial Services  -- -- --       Initial Review   Source of Stress Concerns -- -- Financial;Retirement/disability     Comments -- -- Patient has had a diffcult time working with his company regarding his short term disability and he says this has been very stressful for him.              Psychosocial Discharge (Final Psychosocial Re-Evaluation):  Psychosocial Re-Evaluation - 02/21/22 1339       Psychosocial Re-Evaluation   Current issues with Current Stress Concerns    Comments Patient is new the program and plans to start Monday 11/6. We will continue to monitor his progress in the program.    Expected Outcomes Patient will continue to have no psychosocial barriers identified.    Interventions Stress management education;Encouraged to attend Cardiac Rehabilitation for the exercise;Relaxation education      Initial Review   Source of Stress Concerns Financial;Retirement/disability    Comments Patient has had a diffcult time working with his company regarding his short term disability and he says this has been very stressful for him.              Vocational Rehabilitation: Provide vocational rehab  assistance to qualifying candidates.   Vocational Rehab Evaluation & Intervention:  Vocational Rehab - 02/20/22 1410       Initial Vocational Rehab Evaluation & Intervention   Assessment shows need for Vocational Rehabilitation No      Vocational Rehab Re-Evaulation   Comments Patient plans to return to work at his previous job with [employer].             Education: Education Goals: Education classes will be provided on a weekly basis, covering required topics. Participant will state understanding/return demonstration of topics presented.  Learning Barriers/Preferences:  Learning Barriers/Preferences - 02/20/22 1409       Learning Barriers/Preferences   Learning Preferences Written Material;Video;Audio             Education Topics: Hypertension, Hypertension Reduction -Define heart disease and high blood pressure. Discus how high blood pressure affects the body and ways to reduce high blood pressure.   Exercise and Your Heart -Discuss why it is important to exercise, the FITT principles of exercise, normal and abnormal responses to exercise, and how to exercise safely.   Angina -Discuss definition of angina, causes of angina, treatment of angina, and how to decrease risk of having angina.   Cardiac Medications -Review what the following cardiac medications are used for, how they affect the body, and side effects that may occur when taking the medications.  Medications include Aspirin, Beta blockers, calcium channel blockers, ACE Inhibitors, angiotensin receptor blockers, diuretics, digoxin, and antihyperlipidemics.   Congestive Heart Failure -Discuss the definition of CHF, how to live with CHF, the signs and symptoms of CHF, and how keep track of weight and sodium intake.   Heart Disease and Intimacy -Discus the effect sexual activity has on the heart, how changes occur during intimacy as we age, and safety  during sexual activity.   Smoking Cessation / COPD -Discuss different methods to quit smoking, the health benefits of quitting smoking, and the definition of COPD.   Nutrition I: Fats -Discuss the types of cholesterol, what cholesterol does to the heart, and how cholesterol levels can be controlled.   Nutrition II: Labels -Discuss the different components of food labels and how to read food label   Heart Parts/Heart Disease and PAD -Discuss the anatomy of the heart, the pathway of blood circulation through the heart, and these are affected by heart disease.   Stress I: Signs and Symptoms -Discuss the causes of stress, how stress may lead to anxiety and depression, and ways to limit stress.   Stress II: Relaxation -Discuss different types of relaxation techniques to limit stress.   Warning Signs of Stroke / TIA -Discuss definition of a stroke, what the signs and symptoms are of a stroke, and how to identify when someone is having stroke.   Knowledge Questionnaire Score:  Knowledge Questionnaire Score - 02/20/22 1410       Knowledge Questionnaire Score   Pre Score 21/24             Core Components/Risk Factors/Patient Goals at Admission:  Personal Goals and Risk Factors at Admission - 02/20/22 1411       Core Components/Risk Factors/Patient Goals on Admission    Weight Management Weight Maintenance    Improve shortness of breath with ADL's Yes    Intervention Provide education, individualized exercise plan and daily activity instruction to help decrease symptoms of SOB with activities of daily living.    Expected Outcomes Short Term: Improve cardiorespiratory fitness to achieve a reduction of symptoms when  performing ADLs;Long Term: Be able to perform more ADLs without symptoms or delay the onset of symptoms    Diabetes Yes    Intervention Provide education about signs/symptoms and action to take for hypo/hyperglycemia.;Provide education about proper nutrition,  including hydration, and aerobic/resistive exercise prescription along with prescribed medications to achieve blood glucose in normal ranges: Fasting glucose 65-99 mg/dL    Expected Outcomes Short Term: Participant verbalizes understanding of the signs/symptoms and immediate care of hyper/hypoglycemia, proper foot care and importance of medication, aerobic/resistive exercise and nutrition plan for blood glucose control.;Long Term: Attainment of HbA1C < 7%.    Hypertension Yes    Intervention Provide education on lifestyle modifcations including regular physical activity/exercise, weight management, moderate sodium restriction and increased consumption of fresh fruit, vegetables, and low fat dairy, alcohol moderation, and smoking cessation.;Monitor prescription use compliance.    Expected Outcomes Short Term: Continued assessment and intervention until BP is < 140/31mm HG in hypertensive participants. < 130/23mm HG in hypertensive participants with diabetes, heart failure or chronic kidney disease.;Long Term: Maintenance of blood pressure at goal levels.    Stress Yes    Intervention Refer participants experiencing significant psychosocial distress to appropriate mental health specialists for further evaluation and treatment. When possible, include family members and significant others in education/counseling sessions.    Expected Outcomes Short Term: Participant demonstrates changes in health-related behavior, relaxation and other stress management skills, ability to obtain effective social support, and compliance with psychotropic medications if prescribed.    Personal Goal Other Yes    Personal Goal Patient wants to decrease his SOB with exertion; improve his muscle strength; and get back to running again.    Intervention Patient will attend CR 3 days/week with exercise and education.    Expected Outcomes Patient will participate in the program until he returns to work meeting both personal and program  goals.             Core Components/Risk Factors/Patient Goals Review:   Goals and Risk Factor Review     Row Name 02/21/22 1341             Core Components/Risk Factors/Patient Goals Review   Personal Goals Review Diabetes;Improve shortness of breath with ADL's;Hypertension;Other;Stress;Weight Management/Obesity       Review Patient was referred to CR with [facility]. He has multiple risk factors for CAD and is participating in the program for risk modification. He plans to start the program Monday 11/6. He DM is managed with Metformin. His personal goals for the program are to decrease his SOB with exertion; improve his muscle strenght and get back to running. We will continue to monitor his progress as he works towards meeting these goals.       Expected Outcomes Patient will complete the program meeting both personal and program goals.                Core Components/Risk Factors/Patient Goals at Discharge (Final Review):   Goals and Risk Factor Review - 02/21/22 1341       Core Components/Risk Factors/Patient Goals Review   Personal Goals Review Diabetes;Improve shortness of breath with ADL's;Hypertension;Other;Stress;Weight Management/Obesity    Review Patient was referred to CR with [facility]. He has multiple risk factors for CAD and is participating in the program for risk modification. He plans to start the program Monday 11/6. He DM is managed with Metformin. His personal goals for the program are to decrease his SOB with exertion; improve his muscle strenght and get back to running.  We will continue to monitor his progress as he works towards meeting these goals.    Expected Outcomes Patient will complete the program meeting both personal and program goals.             ITP Comments:   Comments: ITP REVIEW Pt is making expected progress toward Cardiac Rehab goals after completing 2 sessions. Recommend continued exercise, life style modification, education, and  increased stamina and strength.

## 2022-02-26 NOTE — Progress Notes (Signed)
Daily Session Note  Patient Details  Name: Jonathan Riley MRN: [id number] Date of Birth: February 09, 1961 Referring Provider:   Flowsheet Row CARDIAC REHAB PHASE II ORIENTATION from 02/20/2022 in [place]  Referring Provider Dr. Kipp Brood       Encounter Date: 02/26/2022  Check In:  Session Check In - 02/26/22 0930       Check-In   Supervising physician immediately available to respond to emergencies [facility] MD immediately available    Physician(s) Dr. Dellia Cloud    Location [facility]    Staff Present Leana Roe, BS, Exercise Physiologist;Debra Wynetta Emery, RN, BSN    Virtual Visit No    Medication changes reported     No    Fall or balance concerns reported    No    Tobacco Cessation No Change    Warm-up and Cool-down Performed as group-led instruction    Resistance Training Performed Yes    VAD Patient? No    PAD/SET Patient? No      Pain Assessment   Currently in Pain? No/denies    Pain Score 0-No pain    Multiple Pain Sites No             Capillary Blood Glucose: No results found for this or any previous visit (from the past 24 hour(s)).    Social History   Tobacco Use  Smoking Status Former   Types: Cigarettes  Smokeless Tobacco Never  Tobacco Comments   "quit about 12 years ago" 02/26/2016    Goals Met:  Independence with exercise equipment Exercise tolerated well No report of concerns or symptoms today Strength training completed today  Goals Unmet:  Not Applicable  Comments: check out 1030   Dr. Carlyle Dolly is Medical Director for [facility]

## 2022-02-28 ENCOUNTER — Encounter (HOSPITAL_COMMUNITY)
Admission: RE | Admit: 2022-02-28 | Discharge: 2022-02-28 | Disposition: A | Discharge status: Home / Self Care | Payer: BC Managed Care – PPO | Source: Ambulatory Visit | Attending: Thoracic Surgery (Cardiothoracic Vascular Surgery) | Admitting: Thoracic Surgery (Cardiothoracic Vascular Surgery)

## 2022-02-28 DIAGNOSIS — Z951 Presence of aortocoronary bypass graft: Secondary | ICD-10-CM

## 2022-02-28 NOTE — Progress Notes (Signed)
Daily Session Note  Patient Details  Name: Jonathan Riley MRN: [id number] Date of Birth: 08-Feb-1961 Referring Provider:   Flowsheet Row CARDIAC REHAB PHASE II ORIENTATION from 02/20/2022 in [place]  Referring Provider Dr. Kipp Brood       Encounter Date: 02/28/2022  Check In:  Session Check In - 02/28/22 0930       Check-In   Supervising physician immediately available to respond to emergencies [facility] MD immediately available    Physician(s) Dr. Dellia Cloud    Location [facility]    Staff Present Hoy Register MHA, MS, ACSM-CEP;Salina Stanfield Wynetta Emery, RN, Joanette Gula, RN, BSN    Virtual Visit No    Medication changes reported     No    Fall or balance concerns reported    No    Tobacco Cessation No Change    Warm-up and Cool-down Performed as group-led instruction    Resistance Training Performed Yes    VAD Patient? No    PAD/SET Patient? No      Pain Assessment   Currently in Pain? No/denies    Pain Score 0-No pain    Multiple Pain Sites No             Capillary Blood Glucose: No results found for this or any previous visit (from the past 24 hour(s)).    Social History   Tobacco Use  Smoking Status Former   Types: Cigarettes  Smokeless Tobacco Never  Tobacco Comments   "quit about 12 years ago" 02/26/2016    Goals Met:  Independence with exercise equipment Exercise tolerated well No report of concerns or symptoms today Strength training completed today  Goals Unmet:  Not Applicable  Comments: Check out 1030.   Dr. Carlyle Dolly is Medical Director for [facility] Card[facility]

## 2022-03-03 ENCOUNTER — Encounter (HOSPITAL_COMMUNITY)
Admission: RE | Admit: 2022-03-03 | Discharge: 2022-03-03 | Disposition: A | Discharge status: Home / Self Care | Payer: BC Managed Care – PPO | Source: Ambulatory Visit | Attending: Thoracic Surgery (Cardiothoracic Vascular Surgery) | Admitting: Thoracic Surgery (Cardiothoracic Vascular Surgery)

## 2022-03-03 DIAGNOSIS — Z951 Presence of aortocoronary bypass graft: Secondary | ICD-10-CM | POA: Diagnosis not present

## 2022-03-03 NOTE — Progress Notes (Signed)
Daily Session Note  Patient Details  Name: Jonathan Riley MRN: [id number] Date of Birth: [birth year] Referring Provider:   Flowsheet Row CARDIAC REHAB PHASE II ORIENTATION from 02/20/2022 in [place]  Referring Provider Dr. Kipp Brood       Encounter Date: 03/03/2022  Check In:  Session Check In - 03/03/22 0930       Check-In   Supervising physician immediately available to respond to emergencies [facility] MD immediately available    Physician(s) Dr. Harl Bowie    Location [facility]    Staff Present Daphyne Hassell Done, RN, BSN;Ryann Pauli Sherrie George, MS, ACSM-CEP;Leana Roe, BS, Exercise Physiologist    Virtual Visit No    Medication changes reported     No    Fall or balance concerns reported    No    Tobacco Cessation No Change    Warm-up and Cool-down Performed as group-led instruction    Resistance Training Performed Yes    VAD Patient? No    PAD/SET Patient? No      Pain Assessment   Currently in Pain? No/denies    Pain Score 0-No pain    Multiple Pain Sites No             Capillary Blood Glucose: No results found for this or any previous visit (from the past 24 hour(s)).    Social History   Tobacco Use  Smoking Status Former   Types: Cigarettes  Smokeless Tobacco Never  Tobacco Comments   "quit about 12 years ago" 02/26/2016    Goals Met:  Independence with exercise equipment Exercise tolerated well No report of concerns or symptoms today Strength training completed today  Goals Unmet:  Not Applicable  Comments: checkout time is 1030   Dr. Carlyle Dolly is Medical Director for [facility]

## 2022-03-05 ENCOUNTER — Telehealth (HOSPITAL_BASED_OUTPATIENT_CLINIC_OR_DEPARTMENT_OTHER): Payer: Self-pay | Admitting: Cardiology

## 2022-03-05 ENCOUNTER — Encounter (HOSPITAL_COMMUNITY)
Admission: RE | Admit: 2022-03-05 | Discharge: 2022-03-05 | Disposition: A | Discharge status: Home / Self Care | Payer: BC Managed Care – PPO | Source: Ambulatory Visit | Attending: Thoracic Surgery (Cardiothoracic Vascular Surgery) | Admitting: Thoracic Surgery (Cardiothoracic Vascular Surgery)

## 2022-03-05 DIAGNOSIS — Z951 Presence of aortocoronary bypass graft: Secondary | ICD-10-CM

## 2022-03-05 MED ORDER — METOPROLOL TARTRATE 25 MG PO TABS: 25.0000 mg | ORAL_TABLET | Freq: Two times a day (BID) | ORAL | 1 refills | Status: DC

## 2022-03-05 MED ORDER — ATORVASTATIN CALCIUM 80 MG PO TABS: 80.0000 mg | ORAL_TABLET | Freq: Every day | ORAL | 1 refills | Status: DC

## 2022-03-05 NOTE — Progress Notes (Signed)
Daily Session Note  Patient Details  Name: Jonathan Riley MRN: [id number] Date of Birth: 30-Aug-1960 Referring Provider:   Flowsheet Row CARDIAC REHAB PHASE II ORIENTATION from 02/20/2022 in [place]  Referring Provider Dr. Kipp Brood       Encounter Date: 03/05/2022  Check In:  Session Check In - 03/05/22 0930       Check-In   Supervising physician immediately available to respond to emergencies [facility] MD immediately available    Physician(s) Dr. Harl Bowie    Location [facility]    Staff Present Hoy Register MHA, MS, ACSM-CEP;Leana Roe, BS, Exercise Physiologist    Virtual Visit No    Medication changes reported     No    Fall or balance concerns reported    No    Tobacco Cessation No Change    Warm-up and Cool-down Performed as group-led instruction    Resistance Training Performed Yes    VAD Patient? No    PAD/SET Patient? No      Pain Assessment   Currently in Pain? No/denies    Pain Score 0-No pain    Multiple Pain Sites No             Capillary Blood Glucose: No results found for this or any previous visit (from the past 24 hour(s)).    Social History   Tobacco Use  Smoking Status Former   Types: Cigarettes  Smokeless Tobacco Never  Tobacco Comments   "quit about 12 years ago" 02/26/2016    Goals Met:  Independence with exercise equipment Exercise tolerated well No report of concerns or symptoms today Strength training completed today  Goals Unmet:  Not Applicable  Comments: check out 1030   Dr. Carlyle Dolly is Medical Director for [facility]

## 2022-03-05 NOTE — Telephone Encounter (Signed)
Rx request sent to pharmacy.  

## 2022-03-05 NOTE — Telephone Encounter (Signed)
*  STAT* If patient is at the pharmacy, call can be transferred to refill team.   1. Which medications need to be refilled? (please list name of each medication and dose if known) atorvastatin (LIPITOR) 80 MG tablet  metoprolol tartrate (LOPRESSOR) 25 MG tablet   2. Which pharmacy/location (including street and city if local pharmacy) is medication to be sent to? [id number] - [place], [place] - [address]   3. Do they need a 30 day or 90 day supply? 90 day supply

## 2022-03-05 NOTE — Progress Notes (Signed)
I have reviewed a Home Exercise Prescription with Jonathan Riley . Jonathan Riley is currently exercising at home by walking 35 minutes.  The patient was advised to walk 5 days a week for 30-45 minutes.  Jonathan Fearing and I discussed how to progress their exercise prescription.  The patient stated that their goals were build up his endurance so he is able to start running again.  The patient stated that they understand the exercise prescription.  We reviewed exercise guidelines, target heart rate during exercise, RPE Scale, weather conditions, NTG use, endpoints for exercise, warmup and cool down.  Patient is encouraged to come to me with any questions. I will continue to follow up with the patient to assist them with progression and safety.

## 2022-03-07 ENCOUNTER — Encounter (HOSPITAL_COMMUNITY)
Admission: RE | Admit: 2022-03-07 | Discharge: 2022-03-07 | Disposition: A | Discharge status: Home / Self Care | Payer: BC Managed Care – PPO | Source: Ambulatory Visit | Attending: Thoracic Surgery (Cardiothoracic Vascular Surgery) | Admitting: Thoracic Surgery (Cardiothoracic Vascular Surgery)

## 2022-03-07 DIAGNOSIS — Z951 Presence of aortocoronary bypass graft: Secondary | ICD-10-CM | POA: Diagnosis not present

## 2022-03-07 NOTE — Progress Notes (Signed)
Daily Session Note  Patient Details  Name: Jonathan Riley MRN: [id number] Date of Birth: 07/09/1960 Referring Provider:   Flowsheet Row CARDIAC REHAB PHASE II ORIENTATION from 02/20/2022 in [place]  Referring Provider Dr. Kipp Brood       Encounter Date: 03/07/2022  Check In:  Session Check In - 03/07/22 0930       Check-In   Supervising physician immediately available to respond to emergencies [facility] MD immediately available    Physician(s) Dr. Harl Bowie    Location [facility]    Staff Present Leana Roe, BS, Exercise Physiologist;Debra Wynetta Emery, RN, Joanette Gula, RN, BSN    Virtual Visit No    Medication changes reported     No    Fall or balance concerns reported    No    Tobacco Cessation No Change    Warm-up and Cool-down Performed as group-led instruction    Resistance Training Performed Yes    VAD Patient? No    PAD/SET Patient? No      Pain Assessment   Currently in Pain? No/denies    Pain Score 0-No pain    Multiple Pain Sites No             Capillary Blood Glucose: No results found for this or any previous visit (from the past 24 hour(s)).    Social History   Tobacco Use  Smoking Status Former   Types: Cigarettes  Smokeless Tobacco Never  Tobacco Comments   "quit about 12 years ago" 02/26/2016    Goals Met:  Independence with exercise equipment Exercise tolerated well No report of concerns or symptoms today Strength training completed today  Goals Unmet:  Not Applicable  Comments: Checkout at 1030.   Dr. Carlyle Dolly is Medical Director for [facility] Card[facility]

## 2022-03-10 ENCOUNTER — Encounter (HOSPITAL_COMMUNITY)
Admission: RE | Admit: 2022-03-10 | Discharge: 2022-03-10 | Disposition: A | Discharge status: Home / Self Care | Payer: BC Managed Care – PPO | Source: Ambulatory Visit | Attending: Thoracic Surgery (Cardiothoracic Vascular Surgery) | Admitting: Thoracic Surgery (Cardiothoracic Vascular Surgery)

## 2022-03-10 VITALS — Wt 252.9 lb

## 2022-03-10 DIAGNOSIS — Z951 Presence of aortocoronary bypass graft: Secondary | ICD-10-CM

## 2022-03-10 NOTE — Progress Notes (Signed)
Daily Session Note  Patient Details  Name: Jonathan Riley MRN: [id number] Date of Birth: [birth year] Referring Provider:   Flowsheet Row CARDIAC REHAB PHASE II ORIENTATION from 02/20/2022 in [place]  Referring Provider Dr. Kipp Brood       Encounter Date: 03/10/2022  Check In:  Session Check In - 03/10/22 0930       Check-In   Supervising physician immediately available to respond to emergencies [facility] MD immediately available    Physician(s) Dr Domenic Polite    Location [facility]    Staff Present Leana Roe, BS, Exercise Physiologist;Mayank Teuscher Hassell Done, RN, BSN;D[place]ton Sherrie George, MS, ACSM-CEP    Virtual Visit No    Medication changes reported     No    Fall or balance concerns reported    No    Tobacco Cessation No Change    Warm-up and Cool-down Performed as group-led instruction    Resistance Training Performed Yes    VAD Patient? No    PAD/SET Patient? No      Pain Assessment   Currently in Pain? No/denies    Pain Score 0-No pain    Multiple Pain Sites No             Capillary Blood Glucose: No results found for this or any previous visit (from the past 24 hour(s)).    Social History   Tobacco Use  Smoking Status Former   Types: Cigarettes  Smokeless Tobacco Never  Tobacco Comments   "quit about 12 years ago" 02/26/2016    Goals Met:  Independence with exercise equipment Exercise tolerated well No report of concerns or symptoms today Strength training completed today  Goals Unmet:  Not Applicable  Comments: Checkout at 1030.    Dr. Carlyle Dolly is Medical Director for [facility] Card[facility] Re[facility]

## 2022-03-11 ENCOUNTER — Ambulatory Visit (INDEPENDENT_AMBULATORY_CARE_PROVIDER_SITE_OTHER): Payer: BC Managed Care – PPO | Admitting: Psychology

## 2022-03-11 DIAGNOSIS — F3289 Other specified depressive episodes: Secondary | ICD-10-CM | POA: Diagnosis not present
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
## 2022-03-12 ENCOUNTER — Encounter (HOSPITAL_COMMUNITY): Payer: BC Managed Care – PPO

## 2022-03-14 ENCOUNTER — Encounter (HOSPITAL_COMMUNITY): Payer: BC Managed Care – PPO

## 2022-03-17 ENCOUNTER — Encounter (HOSPITAL_COMMUNITY)
Admission: RE | Admit: 2022-03-17 | Discharge: 2022-03-17 | Disposition: A | Discharge status: Home / Self Care | Payer: BC Managed Care – PPO | Source: Ambulatory Visit | Attending: Thoracic Surgery (Cardiothoracic Vascular Surgery) | Admitting: Thoracic Surgery (Cardiothoracic Vascular Surgery)

## 2022-03-17 DIAGNOSIS — Z951 Presence of aortocoronary bypass graft: Secondary | ICD-10-CM

## 2022-03-17 NOTE — Progress Notes (Signed)
Daily Session Note  Patient Details  Name: Jonathan Riley MRN: [id number] Date of Birth: Apr 13, 1961 Referring Provider:   Flowsheet Row CARDIAC REHAB PHASE II ORIENTATION from 02/20/2022 in [facility]  Referring Provider Dr. Kipp Brood       Encounter Date: 03/17/2022  Check In:  Session Check In - 03/17/22 0933       Check-In   Supervising physician immediately available to respond to emergencies [facility] MD immediately available    Physician(s) Dr Dellia Cloud    Location [facility]    Staff Present Leana Roe, BS, Exercise Physiologist;Antoino Westhoff Hassell Done, RN, BSN;Dalton Sherrie George, MS, ACSM-CEP    Virtual Visit No    Medication changes reported     No    Fall or balance concerns reported    No    Tobacco Cessation No Change    Warm-up and Cool-down Performed as group-led instruction    Resistance Training Performed Yes    VAD Patient? No    PAD/SET Patient? No      Pain Assessment   Currently in Pain? No/denies    Pain Score 0-No pain    Multiple Pain Sites No             Capillary Blood Glucose: No results found for this or any previous visit (from the past 24 hour(s)).    Social History   Tobacco Use  Smoking Status Former   Types: Cigarettes  Smokeless Tobacco Never  Tobacco Comments   "quit about 12 years ago" 02/26/2016    Goals Met:  Independence with exercise equipment Exercise tolerated well No report of concerns or symptoms today Strength training completed today  Goals Unmet:  Not Applicable  Comments: Checkout at 1030.   Dr. Carlyle Dolly is Medical Director for [facility]

## 2022-03-18 DIAGNOSIS — E7800 Pure hypercholesterolemia, unspecified: Secondary | ICD-10-CM | POA: Diagnosis not present

## 2022-03-19 ENCOUNTER — Encounter (HOSPITAL_COMMUNITY)
Admission: RE | Admit: 2022-03-19 | Discharge: 2022-03-19 | Disposition: A | Discharge status: Home / Self Care | Payer: BC Managed Care – PPO | Source: Ambulatory Visit | Attending: Thoracic Surgery (Cardiothoracic Vascular Surgery) | Admitting: Thoracic Surgery (Cardiothoracic Vascular Surgery)

## 2022-03-19 DIAGNOSIS — Z951 Presence of aortocoronary bypass graft: Secondary | ICD-10-CM | POA: Diagnosis not present

## 2022-03-19 LAB — HEPATIC FUNCTION PANEL
ALT: 21 IU/L (ref 0–44)
AST: 25 IU/L (ref 0–40)
Albumin: 4.7 g/dL (ref 3.9–4.9)
Alkaline Phosphatase: 106 IU/L (ref 44–121)
Bilirubin Total: 0.5 mg/dL (ref 0.0–1.2)
Bilirubin, Direct: 0.15 mg/dL (ref 0.00–0.40)
Total Protein: 7.3 g/dL (ref 6.0–8.5)

## 2022-03-19 LAB — LIPID PANEL
Chol/HDL Ratio: 4.1 ratio (ref 0.0–5.0)
Cholesterol, Total: 140 mg/dL (ref 100–199)
HDL: 34 mg/dL — ABNORMAL LOW (ref >39)
LDL Chol Calc (NIH): 77 mg/dL (ref 0–99)
Triglycerides: 166 mg/dL — ABNORMAL HIGH (ref 0–149)
VLDL Cholesterol Cal: 29 mg/dL (ref 5–40)

## 2022-03-19 NOTE — Progress Notes (Signed)
Daily Session Note  Patient Details  Name: Jonathan Riley MRN: [id number] Date of Birth: 1960/06/13 Referring Provider:   Flowsheet Row CARDIAC REHAB PHASE II ORIENTATION from 02/20/2022 in [facility]  Referring Provider Dr. Kipp Brood       Encounter Date: 03/19/2022  Check In:  Session Check In - 03/19/22 0930       Check-In   Supervising physician immediately available to respond to emergencies [facility] MD immediately available    Physician(s) Dr Dellia Cloud    Location [facility]    Staff Present Leana Roe, BS, Exercise Physiologist;Dalton Sherrie George, MS, ACSM-CEP;Melven Sartorius BSN, RN    Virtual Visit No    Medication changes reported     No    Fall or balance concerns reported    No    Tobacco Cessation No Change    Warm-up and Cool-down Performed as group-led Higher education careers adviser Performed Yes    VAD Patient? No    PAD/SET Patient? No      Pain Assessment   Currently in Pain? No/denies    Pain Score 0-No pain    Multiple Pain Sites No             Capillary Blood Glucose: Results for orders placed or performed in visit on 01/20/22 (from the past 24 hour(s))  Lipid panel     Status: Abnormal   Collection Time: 03/18/22 10:39 AM  Result Value Ref Range   Cholesterol, Total 140 100 - 199 mg/dL   Triglycerides 166 (H) 0 - 149 mg/dL   HDL 34 (L) >39 mg/dL   VLDL Cholesterol Cal 29 5 - 40 mg/dL   LDL Chol Calc (NIH) 77 0 - 99 mg/dL   Chol/HDL Ratio 4.1 0.0 - 5.0 ratio   Narrative   Performed at:  [address] [place], [place]  [id number] Lab Director: Rush Farmer MD, Phone:  [phone]  Hepatic function panel     Status: None   Collection Time: 03/18/22 10:41 AM  Result Value Ref Range   Total Protein 7.3 6.0 - 8.5 g/dL   Albumin 4.7 3.9 - 4.9 g/dL   Bilirubin Total 0.5 0.0 - 1.2 mg/dL   Bilirubin, Direct 0.15 0.00 - 0.40 mg/dL   Alkaline Phosphatase 106 44 - 121  IU/L   AST 25 0 - 40 IU/L   ALT 21 0 - 44 IU/L   Narrative   Performed at:  [address] [place], [place]  [id number] Lab Director: Rush Farmer MD, Phone:  [phone]      Social History   Tobacco Use  Smoking Status Former   Types: Cigarettes  Smokeless Tobacco Never  Tobacco Comments   "quit about 12 years ago" 02/26/2016    Goals Met:  Independence with exercise equipment Exercise tolerated well No report of concerns or symptoms today Strength training completed today  Goals Unmet:  Not Applicable  Comments: check out at 10:30   Dr. Carlyle Dolly is Medical Director for [facility]

## 2022-03-19 NOTE — Progress Notes (Signed)
      [address]       [place] [address]             [phone]        Jonathan Riley [employer] Medical Record #[id number] Date of Birth: 11-11-1960  Referring: Lyn Records, MD Primary Care: Blair Heys, MD Primary Cardiologist:Bridgette Cristal Deer, MD  Reason for visit:   follow-up  History of Present Illness:     61 year old male who underwent CABG in September presents in follow-up to discuss her return to work date.  He states that he continues to have some chest wall pain with coughing and sneezing.  He is concerned that he will not be able to do the full extent of his work given this pain.  He recently started cardiac rehab and is only had about 2 weeks of therapy.    Physical Exam: BP (!) 162/69 (BP Location: Right Arm, Patient Position: Sitting, Cuff Size: Large)   Pulse 68   Wt 258 lb (117 kg)   SpO2 94%   BMI 37.02 kg/m   Alert NAD Abdomen, ND Trace peripheral edema    Assessment / Plan:   61 year old male status post CABG. he continues to have some chest wall pain, and only started cardiac rehab about 2 weeks ago.  I do think that it would be advisable for him to continue cardiac rehab at least for another month before returning to work.  I will see him back in 1 month to assess his return to work status.   Corliss Skains 03/21/2022 1:14 PM

## 2022-03-21 ENCOUNTER — Encounter: Payer: Self-pay | Admitting: Thoracic Surgery (Cardiothoracic Vascular Surgery)

## 2022-03-21 ENCOUNTER — Ambulatory Visit (INDEPENDENT_AMBULATORY_CARE_PROVIDER_SITE_OTHER): Payer: Self-pay | Admitting: Thoracic Surgery (Cardiothoracic Vascular Surgery)

## 2022-03-21 ENCOUNTER — Encounter (HOSPITAL_COMMUNITY): Payer: BC Managed Care – PPO

## 2022-03-21 VITALS — BP 162/69 | HR 68 | Wt 258.0 lb

## 2022-03-21 DIAGNOSIS — Z951 Presence of aortocoronary bypass graft: Secondary | ICD-10-CM

## 2022-03-24 ENCOUNTER — Encounter (HOSPITAL_COMMUNITY): Payer: BC Managed Care – PPO

## 2022-03-24 ENCOUNTER — Encounter (HOSPITAL_COMMUNITY)
Admission: RE | Admit: 2022-03-24 | Discharge: 2022-03-24 | Disposition: A | Discharge status: Home / Self Care | Payer: BC Managed Care – PPO | Source: Ambulatory Visit | Attending: Thoracic Surgery (Cardiothoracic Vascular Surgery) | Admitting: Thoracic Surgery (Cardiothoracic Vascular Surgery)

## 2022-03-24 VITALS — Wt 257.7 lb

## 2022-03-24 DIAGNOSIS — Z951 Presence of aortocoronary bypass graft: Secondary | ICD-10-CM | POA: Insufficient documentation

## 2022-03-24 NOTE — Progress Notes (Signed)
Daily Session Note  Patient Details  Name: Jonathan Riley MRN: [id number] Date of Birth: 1961/01/11 Referring Provider:   Flowsheet Row CARDIAC REHAB PHASE II ORIENTATION from 02/20/2022 in [facility]  Referring Provider Dr. Kipp Brood       Encounter Date: 03/24/2022  Check In:  Session Check In - 03/24/22 0930       Check-In   Supervising physician immediately available to respond to emergencies [facility] MD immediately available    Physician(s) Dr. Harl Bowie    Location [facility]    Staff Present Leana Roe, BS, Exercise Physiologist;Dalton Sherrie George, MS, ACSM-CEP;Madelyn Flavors, RN, BSN    Virtual Visit No    Medication changes reported     No    Fall or balance concerns reported    No    Tobacco Cessation No Change    Warm-up and Cool-down Performed as group-led instruction    Resistance Training Performed Yes    VAD Patient? No    PAD/SET Patient? No      Pain Assessment   Currently in Pain? No/denies    Pain Score 0-No pain    Multiple Pain Sites No             Capillary Blood Glucose: No results found for this or any previous visit (from the past 24 hour(s)).    Social History   Tobacco Use  Smoking Status Former   Types: Cigarettes  Smokeless Tobacco Never  Tobacco Comments   "quit about 12 years ago" 02/26/2016    Goals Met:  Independence with exercise equipment Exercise tolerated well No report of concerns or symptoms today Strength training completed today  Goals Unmet:  Not Applicable  Comments: check out 1030   Dr. Carlyle Dolly is Medical Director for [facility]

## 2022-03-26 ENCOUNTER — Encounter (HOSPITAL_COMMUNITY)
Admission: RE | Admit: 2022-03-26 | Discharge: 2022-03-26 | Disposition: A | Discharge status: Home / Self Care | Payer: BC Managed Care – PPO | Source: Ambulatory Visit | Attending: Thoracic Surgery (Cardiothoracic Vascular Surgery) | Admitting: Thoracic Surgery (Cardiothoracic Vascular Surgery)

## 2022-03-26 DIAGNOSIS — Z951 Presence of aortocoronary bypass graft: Secondary | ICD-10-CM | POA: Diagnosis not present

## 2022-03-26 NOTE — Progress Notes (Signed)
Cardiac Individual Treatment Plan  Patient Details  Name: Jonathan Riley MRN: [id number] Date of Birth: 31-Jul-1960 Referring Provider:   Flowsheet Row CARDIAC REHAB PHASE II ORIENTATION from 02/20/2022 in [facility] CARDIAC REHABILITATION  Referring Provider Dr. Cliffton Asters       Initial Encounter Date:  Flowsheet Row CARDIAC REHAB PHASE II ORIENTATION from 02/20/2022 in [place] CARDIAC REHABILITATION  Date 02/20/22       Visit Diagnosis: S/P CABG x 5  Patient's Home Medications on Admission:  Current Outpatient Medications:    acetaminophen (TYLENOL) 500 MG tablet, Take 1,000 mg by mouth every 6 (six) hours as needed (pain.)., Disp: , Rfl:    amLODipine (NORVASC) 10 MG tablet, Take 1 tablet (10 mg total) by mouth daily., Disp: 30 tablet, Rfl: 1   aspirin EC 325 MG tablet, Take 1 tablet (325 mg total) by mouth daily., Disp: , Rfl:    atorvastatin (LIPITOR) 80 MG tablet, Take 1 tablet (80 mg total) by mouth daily., Disp: 90 tablet, Rfl: 1   diphenhydrAMINE (BENADRYL) 25 mg capsule, Take 25 mg by mouth at bedtime as needed for sleep., Disp: , Rfl:    ibuprofen (ADVIL) 200 MG tablet, Take 400 mg by mouth every 8 (eight) hours as needed (pain.)., Disp: , Rfl:    metFORMIN (GLUCOPHAGE-XR) 500 MG 24 hr tablet, Take 500 mg by mouth daily., Disp: , Rfl:    metoprolol tartrate (LOPRESSOR) 25 MG tablet, Take 1 tablet (25 mg total) by mouth 2 (two) times daily., Disp: 180 tablet, Rfl: 1   sildenafil (REVATIO) 20 MG tablet, Take 40-100 mg by mouth daily as needed (erectile dysfunction)., Disp: , Rfl:    Sodium Bicarbonate (NICE PURE BAKING SODA) POWD, Take 1 Dose by mouth 2 (two) times daily as needed (acid reflux/indigestion). Baking Soda Mixed with water if needed for reflux/indigestion., Disp: , Rfl:    telmisartan (MICARDIS) 80 MG tablet, Take 80 mg by mouth daily., Disp: , Rfl:   Past Medical History: Past Medical History:  Diagnosis Date   Arthritis    Depression    Diabetes  mellitus without complication (HCC)    Dyspnea    "with exertion"   Hypertension    Nocturia    1-2 times during night   PONV (postoperative nausea and vomiting)    "nausea after a knee surgery"    Tobacco Use: Social History   Tobacco Use  Smoking Status Former   Types: Cigarettes  Smokeless Tobacco Never  Tobacco Comments   "quit about 12 years ago" 02/26/2016    Labs: Review Flowsheet  More data may exist      Latest Ref Rng & Units 12/10/2021 12/20/2021 12/24/2021 12/25/2021 03/18/2022  Labs for ITP Cardiac and Pulmonary Rehab  Cholestrol 100 - 199 mg/dL 938  - - - 182   LDL (calc) 0 - 99 mg/dL 993  - - - 77   HDL-C >71 mg/dL 30  - - - 34   Trlycerides 0 - 149 mg/dL 696  - - - 789   Hemoglobin A1c 4.8 - 5.6 % - 7.4  - - -  PH, Arterial 7.35 - 7.45 - - 7.273  7.257  7.328  7.334  7.326  7.316  7.301  7.329  7.305  7.251  7.306  -  PCO2 arterial 32 - 48 mmHg - - 42.3  49.4  44.5  40.4  46.0  48.2  51.4  35.3  36.1  40.1  37.3  -  Bicarbonate 20.0 -  28.0 mmol/L - - 19.5  22.0  23.3  21.5  24.0  24.6  26.2  25.4  18.6  17.9  17.2  18.4  -  TCO2 22 - 32 mmol/L - - 21  22  23  25  23  23  25  26  26  28  27  24  27  20  19  18  19   -  Acid-base deficit 0.0 - 2.0 mmol/L - - 7.0  5.0  3.0  4.0  2.0  2.0  1.0  1.0  7.0  8.0  9.0  7.0  -  O2 Saturation % - - 99  91  100  100  100  100  71  100  97  97  93  91  -    Capillary Blood Glucose: Lab Results  Component Value Date   GLUCAP 126 (H) 12/30/2021   GLUCAP 153 (H) 12/29/2021   GLUCAP 193 (H) 12/29/2021   GLUCAP 159 (H) 12/29/2021   GLUCAP 134 (H) 12/29/2021     Exercise Target Goals: Exercise Program Goal: Individual exercise prescription set using results from initial 6 min walk test and THRR while considering  patient's activity barriers and safety.   Exercise Prescription Goal: Starting with aerobic activity 30 plus minutes a day, 3 days per week for initial exercise prescription. Provide home exercise prescription and  guidelines that participant acknowledges understanding prior to discharge.  Activity Barriers & Risk Stratification:  Activity Barriers & Cardiac Risk Stratification - 02/20/22 1311       Activity Barriers & Cardiac Risk Stratification   Activity Barriers Left Knee Replacement;Right Hip Replacement;Shortness of Breath    Cardiac Risk Stratification High             6 Minute Walk:  6 Minute Walk     Row Name 02/20/22 1408         6 Minute Walk   Phase Initial     Distance 1950 feet     Walk Time 6 minutes     # of Rest Breaks 0     MPH 3.69     METS 4.69     RPE 11     VO2 Peak 16.43     Symptoms No     Resting HR 73 bpm     Resting BP 150/70     Resting Oxygen Saturation  94 %     Exercise Oxygen Saturation  during 6 min walk 97 %     Max Ex. HR 120 bpm     Max Ex. BP 180/66     2 Minute Post BP 160/62              Oxygen Initial Assessment:   Oxygen Re-Evaluation:   Oxygen Discharge (Final Oxygen Re-Evaluation):   Initial Exercise Prescription:  Initial Exercise Prescription - 02/20/22 1400       Date of Initial Exercise RX and Referring Provider   Date 02/20/22    Referring Provider Dr. Kipp Brood    Expected Discharge Date 05/14/22      Treadmill   MPH 2.5    Grade 0    Minutes 17      NuStep   Level 1    SPM 60    Minutes 22      Prescription Details   Frequency (times per week) 3    Duration Progress to 30 minutes of continuous aerobic without signs/symptoms of physical distress  Intensity   THRR 40-80% of Max Heartrate 64-127    Ratings of Perceived Exertion 11-13      Resistance Training   Training Prescription Yes    Weight 4    Reps 10-15             Perform Capillary Blood Glucose checks as needed.  Exercise Prescription Changes:   Exercise Prescription Changes     Row Name 02/24/22 1500 03/05/22 0900 03/10/22 1200 03/24/22 1300       Response to Exercise   Blood Pressure (Admit) 128/74 -- 134/70  128/60    Blood Pressure (Exercise) 160/60 -- 180/76 178/70    Blood Pressure (Exit) 130/68 -- 124/62 132/60    Heart Rate (Admit) 67 bpm -- 66 bpm 62 bpm    Heart Rate (Exercise) 104 bpm -- 113 bpm 106 bpm    Heart Rate (Exit) 76 bpm -- 75 bpm 71 bpm    Rating of Perceived Exertion (Exercise) 12 -- 12 11    Duration Continue with 30 min of aerobic exercise without signs/symptoms of physical distress. -- Continue with 30 min of aerobic exercise without signs/symptoms of physical distress. Continue with 30 min of aerobic exercise without signs/symptoms of physical distress.    Intensity THRR unchanged -- THRR unchanged THRR unchanged      Progression   Progression Continue to progress workloads to maintain intensity without signs/symptoms of physical distress. -- Continue to progress workloads to maintain intensity without signs/symptoms of physical distress. Continue to progress workloads to maintain intensity without signs/symptoms of physical distress.      Resistance Training   Training Prescription Yes -- Yes Yes    Weight 4 -- 5 10    Reps 10-15 -- 10-15 10-15    Time 10 Minutes -- 10 Minutes 10 Minutes      Treadmill   MPH 3 -- 3.5 3.3    Grade 0 -- 1.5 3    Minutes 17 -- 22 22    METs 3.3 -- 4.4 4.89      NuStep   Level 3 -- -- --    SPM 157 -- -- --    Minutes 22 -- -- --    METs 7.81 -- -- --      Recumbant Elliptical   Level -- -- 5 7    RPM -- -- 83 66    Minutes -- -- 17 17    METs -- -- 5.1 7.5      Home Exercise Plan   Plans to continue exercise at -- Home (comment) -- --    Frequency -- Add 2 additional days to program exercise sessions. -- --    Initial Home Exercises Provided -- 03/05/22 -- --             Exercise Comments:   Exercise Comments     Row Name 03/05/22 0958           Exercise Comments Home exercise reviewed                Exercise Goals and Review:   Exercise Goals     Row Name 02/20/22 1414 02/24/22 1510 03/24/22 1324          Exercise Goals   Increase Physical Activity Yes Yes Yes     Intervention Provide advice, education, support and counseling about physical activity/exercise needs.;Develop an individualized exercise prescription for aerobic and resistive training based on initial evaluation findings, risk stratification, comorbidities and participant's personal goals.  Provide advice, education, support and counseling about physical activity/exercise needs.;Develop an individualized exercise prescription for aerobic and resistive training based on initial evaluation findings, risk stratification, comorbidities and participant's personal goals. Provide advice, education, support and counseling about physical activity/exercise needs.;Develop an individualized exercise prescription for aerobic and resistive training based on initial evaluation findings, risk stratification, comorbidities and participant's personal goals.     Expected Outcomes Short Term: Attend rehab on a regular basis to increase amount of physical activity.;Long Term: Exercising regularly at least 3-5 days a week.;Long Term: Add in home exercise to make exercise part of routine and to increase amount of physical activity. Short Term: Attend rehab on a regular basis to increase amount of physical activity.;Long Term: Exercising regularly at least 3-5 days a week.;Long Term: Add in home exercise to make exercise part of routine and to increase amount of physical activity. Short Term: Attend rehab on a regular basis to increase amount of physical activity.;Long Term: Exercising regularly at least 3-5 days a week.;Long Term: Add in home exercise to make exercise part of routine and to increase amount of physical activity.     Increase Strength and Stamina Yes Yes Yes     Intervention Provide advice, education, support and counseling about physical activity/exercise needs.;Develop an individualized exercise prescription for aerobic and resistive training  based on initial evaluation findings, risk stratification, comorbidities and participant's personal goals. Provide advice, education, support and counseling about physical activity/exercise needs.;Develop an individualized exercise prescription for aerobic and resistive training based on initial evaluation findings, risk stratification, comorbidities and participant's personal goals. Provide advice, education, support and counseling about physical activity/exercise needs.;Develop an individualized exercise prescription for aerobic and resistive training based on initial evaluation findings, risk stratification, comorbidities and participant's personal goals.     Expected Outcomes Short Term: Increase workloads from initial exercise prescription for resistance, speed, and METs.;Short Term: Perform resistance training exercises routinely during rehab and add in resistance training at home;Long Term: Improve cardiorespiratory fitness, muscular endurance and strength as measured by increased METs and functional capacity (6MWT) Short Term: Increase workloads from initial exercise prescription for resistance, speed, and METs.;Short Term: Perform resistance training exercises routinely during rehab and add in resistance training at home;Long Term: Improve cardiorespiratory fitness, muscular endurance and strength as measured by increased METs and functional capacity (6MWT) Short Term: Increase workloads from initial exercise prescription for resistance, speed, and METs.;Short Term: Perform resistance training exercises routinely during rehab and add in resistance training at home;Long Term: Improve cardiorespiratory fitness, muscular endurance and strength as measured by increased METs and functional capacity (6MWT)     Able to understand and use rate of perceived exertion (RPE) scale Yes Yes Yes     Intervention Provide education and explanation on how to use RPE scale Provide education and explanation on how to use  RPE scale Provide education and explanation on how to use RPE scale     Expected Outcomes Short Term: Able to use RPE daily in rehab to express subjective intensity level;Long Term:  Able to use RPE to guide intensity level when exercising independently Short Term: Able to use RPE daily in rehab to express subjective intensity level;Long Term:  Able to use RPE to guide intensity level when exercising independently Short Term: Able to use RPE daily in rehab to express subjective intensity level;Long Term:  Able to use RPE to guide intensity level when exercising independently     Knowledge and understanding of Target Heart Rate Range (THRR) Yes Yes Yes  Intervention Provide education and explanation of THRR including how the numbers were predicted and where they are located for reference Provide education and explanation of THRR including how the numbers were predicted and where they are located for reference Provide education and explanation of THRR including how the numbers were predicted and where they are located for reference     Expected Outcomes Short Term: Able to state/look up THRR;Long Term: Able to use THRR to govern intensity when exercising independently;Short Term: Able to use daily as guideline for intensity in rehab Short Term: Able to state/look up THRR;Long Term: Able to use THRR to govern intensity when exercising independently;Short Term: Able to use daily as guideline for intensity in rehab Short Term: Able to state/look up THRR;Long Term: Able to use THRR to govern intensity when exercising independently;Short Term: Able to use daily as guideline for intensity in rehab     Able to check pulse independently Yes Yes Yes     Intervention Provide education and demonstration on how to check pulse in carotid and radial arteries.;Review the importance of being able to check your own pulse for safety during independent exercise Provide education and demonstration on how to check pulse in  carotid and radial arteries.;Review the importance of being able to check your own pulse for safety during independent exercise Provide education and demonstration on how to check pulse in carotid and radial arteries.;Review the importance of being able to check your own pulse for safety during independent exercise     Expected Outcomes Short Term: Able to explain why pulse checking is important during independent exercise;Long Term: Able to check pulse independently and accurately Short Term: Able to explain why pulse checking is important during independent exercise;Long Term: Able to check pulse independently and accurately Short Term: Able to explain why pulse checking is important during independent exercise;Long Term: Able to check pulse independently and accurately     Understanding of Exercise Prescription Yes Yes Yes     Intervention Provide education, explanation, and written materials on patient's individual exercise prescription Provide education, explanation, and written materials on patient's individual exercise prescription Provide education, explanation, and written materials on patient's individual exercise prescription     Expected Outcomes Short Term: Able to explain program exercise prescription;Long Term: Able to explain home exercise prescription to exercise independently Short Term: Able to explain program exercise prescription;Long Term: Able to explain home exercise prescription to exercise independently Short Term: Able to explain program exercise prescription;Long Term: Able to explain home exercise prescription to exercise independently              Exercise Goals Re-Evaluation :  Exercise Goals Re-Evaluation     Row Name 02/24/22 1510 03/24/22 1327           Exercise Goal Re-Evaluation   Exercise Goals Review Increase Physical Activity;Able to understand and use rate of perceived exertion (RPE) scale;Increase Strength and Stamina;Knowledge and understanding of Target  Heart Rate Range (THRR);Able to check pulse independently;Understanding of Exercise Prescription Increase Physical Activity;Increase Strength and Stamina;Able to understand and use rate of perceived exertion (RPE) scale;Knowledge and understanding of Target Heart Rate Range (THRR);Able to check pulse independently;Understanding of Exercise Prescription      Comments Pt has completed his first session of cardiac rehab. He is very motivated and ready to get started. He is currently exercising at 7.81 METs on the stepper. Will contineut to monitor and progress as able. Pt has completed 11 sessions of cardiac rehab. He continues to be  very motivated during rehab. He is hoping to return to work in a month. He is curretnly exercising at 7.5 METs on the ellp. Will continue to monitor and progress as able.      Expected Outcomes Through exercise at rehab and home, the patient will meet their stated goals. Through exercise at rehab and home, the patient will meet their stated goals.                Discharge Exercise Prescription (Final Exercise Prescription Changes):  Exercise Prescription Changes - 03/24/22 1300       Response to Exercise   Blood Pressure (Admit) 128/60    Blood Pressure (Exercise) 178/70    Blood Pressure (Exit) 132/60    Heart Rate (Admit) 62 bpm    Heart Rate (Exercise) 106 bpm    Heart Rate (Exit) 71 bpm    Rating of Perceived Exertion (Exercise) 11    Duration Continue with 30 min of aerobic exercise without signs/symptoms of physical distress.    Intensity THRR unchanged      Progression   Progression Continue to progress workloads to maintain intensity without signs/symptoms of physical distress.      Resistance Training   Training Prescription Yes    Weight 10    Reps 10-15    Time 10 Minutes      Treadmill   MPH 3.3    Grade 3    Minutes 22    METs 4.89      Recumbant Elliptical   Level 7    RPM 66    Minutes 17    METs 7.5             Nutrition:   Target Goals: Understanding of nutrition guidelines, daily intake of sodium 1500mg , cholesterol 200mg , calories 30% from fat and 7% or less from saturated fats, daily to have 5 or more servings of fruits and vegetables.  Biometrics:  Pre Biometrics - 02/20/22 1415       Pre Biometrics   Height 5\' 10"  (1.778 m)    Weight 112.4 kg    Waist Circumference 44 inches    Hip Circumference 41 inches    Waist to Hip Ratio 1.07 %    BMI (Calculated) 35.56    Triceps Skinfold 30 mm    % Body Fat 34.7 %    Grip Strength 33 kg    Flexibility 0 in    Single Leg Stand 45 seconds              Nutrition Therapy Plan and Nutrition Goals:  Nutrition Therapy & Goals - 02/20/22 1407       Personal Nutrition Goals   Comments Patient scored 46 on his diet assessment. Handouts on healthier choices and DM information provided and explained. We offer 2 educational sessions on heart healthy nutrition with handouts and assistance with RD referral if patient is interested.      Intervention Plan   Intervention Nutrition handout(s) given to patient.    Expected Outcomes Short Term Goal: Understand basic principles of dietary content, such as calories, fat, sodium, cholesterol and nutrients.             Nutrition Assessments:  Nutrition Assessments - 02/20/22 1406       MEDFICTS Scores   Pre Score 46            MEDIFICTS Score Key: ?70 Need to make dietary changes  40-70 Heart Healthy Diet ? 40 Therapeutic Level Cholesterol Diet  Picture Your Plate Scores: [id number] Unhealthy dietary pattern with much room for improvement. 41-50 Dietary pattern unlikely to meet recommendations for good health and room for improvement. 51-60 More healthful dietary pattern, with some room for improvement.  >60 Healthy dietary pattern, although there may be some specific behaviors that could be improved.    Nutrition Goals Re-Evaluation:   Nutrition Goals Discharge (Final Nutrition Goals  Re-Evaluation):   Psychosocial: Target Goals: Acknowledge presence or absence of significant depression and/or stress, maximize coping skills, provide positive support system. Participant is able to verbalize types and ability to use techniques and skills needed for reducing stress and depression.  Initial Review & Psychosocial Screening:  Initial Psych Review & Screening - 02/20/22 1413       Initial Review   Current issues with Current Stress Concerns      Family Dynamics   Good Support System? Yes      Barriers   Psychosocial barriers to participate in program The patient should benefit from training in stress management and relaxation.;There are no identifiable barriers or psychosocial needs.      Screening Interventions   Interventions Encouraged to exercise;To provide support and resources with identified psychosocial needs;Provide feedback about the scores to participant    Expected Outcomes Short Term goal: Identification and review with participant of any Quality of Life or Depression concerns found by scoring the questionnaire.             Quality of Life Scores:  Quality of Life - 02/20/22 1416       Quality of Life   Select Quality of Life      Quality of Life Scores   Health/Function Pre 15.4 %    Socioeconomic Pre 16.5 %    Psych/Spiritual Pre 14.71 %    Family Pre 17 %    GLOBAL Pre 15.74 %            Scores of 19 and below usually indicate a poorer quality of life in these areas.  A difference of  2-3 points is a clinically meaningful difference.  A difference of 2-3 points in the total score of the Quality of Life Index has been associated with significant improvement in overall quality of life, self-image, physical symptoms, and general health in studies assessing change in quality of life.  PHQ-9: Review Flowsheet       02/20/2022 02/12/2017  Depression screen PHQ 2/9  Decreased Interest 2 0  Down, Depressed, Hopeless 1 0  PHQ - 2 Score 3 0   Altered sleeping 3 -  Tired, decreased energy 2 -  Change in appetite 3 -  Feeling bad or failure about yourself  1 -  Trouble concentrating 1 -  Moving slowly or fidgety/restless 0 -  Suicidal thoughts 0 -  PHQ-9 Score 13 -  Difficult doing work/chores Not difficult at all -   Interpretation of Total Score  Total Score Depression Severity:  1-4 = Minimal depression, 5-9 = Mild depression, 10-14 = Moderate depression, 15-19 = Moderately severe depression, 20-27 = Severe depression   Psychosocial Evaluation and Intervention:  Psychosocial Evaluation - 02/21/22 1338       Psychosocial Evaluation & Interventions   Interventions Stress management education;Relaxation education;Encouraged to exercise with the program and follow exercise prescription    Comments Patient has no psychosocial barriers to participate in CR identified at his orientation visit. He will not be able to complete all 12 weeks due to returning to work. He is not sure  what that date is but possibly 12/4 but he wants to do as much of the program as he can. His initial PHQ-9 score was 13 and overall QOL was 15.74%. He says he has had a lot of health issues in the past few years. 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 He says he does feel depressed at times and his wife has noticed a change in his mood and personality and wanted him to see a therapist which he has made an appointment. He reports having a very difficult time with the company he works with getting short term disability which has been a stressor for him. He is also concerned about being released to return to work on 12/4. 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 He has an appointment with Dr. Cliffton AstersLightfoot 12/1 to discuss his return to work and I  encouraged him to share his concerns with Dr. Cliffton AstersLightfoot. He agreed he would. He has a great support system with his wife. He is ready to start the program and wants to do as much of it as he is able before returning to work.    Expected Outcomes Patient will continue to have no psychosocial barriers identified.    Continue Psychosocial Services  No Follow up required             Psychosocial Re-Evaluation:  Psychosocial Re-Evaluation     Row Name 02/20/22 1413 02/20/22 1416 02/21/22 1339 03/17/22 0857       Psychosocial Re-Evaluation   Current issues with -- -- Current Stress Concerns Current Stress Concerns    Comments -- -- Patient is new the program and plans to start Monday 11/6. We will continue to monitor his progress in the program. Patient has completed 8 sessions. He continues to have no psychosocial barriers identified. He did see a counselor 11/21 for feeling of depression and at the request of his wife. He seems to enjoy the sessions and demonstrates an interest in improving his health. His stress over his disability has been resolved. We will continue to monitor his progress in the program.    Expected Outcomes -- -- Patient will continue to have no psychosocial barriers identified. --    Interventions -- -- Stress management education;Encouraged to attend Cardiac Rehabilitation for the exercise;Relaxation education Stress management education;Encouraged to attend Cardiac Rehabilitation for the exercise;Relaxation education    Continue Psychosocial Services  -- -- -- No Follow up required      Initial Review   Source of Stress Concerns -- -- Financial;Retirement/disability --    Comments -- -- Patient has had a diffcult time working with his company regarding his short term disability and he says this has been very stressful for him. --             Psychosocial Discharge (Final Psychosocial Re-Evaluation):  Psychosocial Re-Evaluation - 03/17/22 0857        Psychosocial Re-Evaluation   Current issues with Current Stress Concerns    Comments Patient has completed 8 sessions. He continues to have no psychosocial barriers identified. He did see a counselor 11/21 for feeling of depression and at the request of his wife. He seems to enjoy the sessions and demonstrates an interest in improving his health. His stress  over his disability has been resolved. We will continue to monitor his progress in the program.    Interventions Stress management education;Encouraged to attend Cardiac Rehabilitation for the exercise;Relaxation education    Continue Psychosocial Services  No Follow up required             Vocational Rehabilitation: Provide vocational rehab assistance to qualifying candidates.   Vocational Rehab Evaluation & Intervention:  Vocational Rehab - 02/20/22 1410       Initial Vocational Rehab Evaluation & Intervention   Assessment shows need for Vocational Rehabilitation No      Vocational Rehab Re-Evaulation   Comments Patient plans to return to work at his previous job with [employer].             Education: Education Goals: Education classes will be provided on a weekly basis, covering required topics. Participant will state understanding/return demonstration of topics presented.  Learning Barriers/Preferences:  Learning Barriers/Preferences - 02/20/22 1409       Learning Barriers/Preferences   Learning Preferences Written Material;Video;Audio             Education Topics: Hypertension, Hypertension Reduction -Define heart disease and high blood pressure. Discus how high blood pressure affects the body and ways to reduce high blood pressure.   Exercise and Your Heart -Discuss why it is important to exercise, the FITT principles of exercise, normal and abnormal responses to exercise, and how to exercise safely.   Angina -Discuss definition of angina, causes of angina, treatment of angina, and how to decrease risk of  having angina.   Cardiac Medications -Review what the following cardiac medications are used for, how they affect the body, and side effects that may occur when taking the medications.  Medications include Aspirin, Beta blockers, calcium channel blockers, ACE Inhibitors, angiotensin receptor blockers, diuretics, digoxin, and antihyperlipidemics.   Congestive Heart Failure -Discuss the definition of CHF, how to live with CHF, the signs and symptoms of CHF, and how keep track of weight and sodium intake.   Heart Disease and Intimacy -Discus the effect sexual activity has on the heart, how changes occur during intimacy as we age, and safety during sexual activity. Flowsheet Row CARDIAC REHAB PHASE II EXERCISE from 03/26/2022 in [place]  Date 02/26/22  Educator hb  Instruction Review Code 1- Verbalizes Understanding       Smoking Cessation / COPD -Discuss different methods to quit smoking, the health benefits of quitting smoking, and the definition of COPD. Flowsheet Row CARDIAC REHAB PHASE II EXERCISE from 03/26/2022 in [place]  Date 03/05/22  Educator HB  Instruction Review Code 1- Verbalizes Understanding       Nutrition I: Fats -Discuss the types of cholesterol, what cholesterol does to the heart, and how cholesterol levels can be controlled. Flowsheet Row CARDIAC REHAB PHASE II EXERCISE from 03/26/2022 in [place]  Date 03/17/22  Educator HB  Instruction Review Code 1- Verbalizes Understanding       Nutrition II: Labels -Discuss the different components of food labels and how to read food label [place] from 03/26/2022 in [place]  Date 03/19/22  Educator HB  Instruction Review Code 1- Verbalizes Understanding       Heart Parts/Heart Disease and PAD -Discuss the anatomy of the heart, the pathway of blood circulation through the  heart, and these are affected by heart disease.   Stress I: Signs and Symptoms -Discuss the causes of  stress, how stress may lead to anxiety and depression, and ways to limit stress.   Stress II: Relaxation -Discuss different types of relaxation techniques to limit stress.   Warning Signs of Stroke / TIA -Discuss definition of a stroke, what the signs and symptoms are of a stroke, and how to identify when someone is having stroke.   Knowledge Questionnaire Score:  Knowledge Questionnaire Score - 02/20/22 1410       Knowledge Questionnaire Score   Pre Score 21/24             Core Components/Risk Factors/Patient Goals at Admission:  Personal Goals and Risk Factors at Admission - 02/20/22 1411       Core Components/Risk Factors/Patient Goals on Admission    Weight Management Weight Maintenance    Improve shortness of breath with ADL's Yes    Intervention Provide education, individualized exercise plan and daily activity instruction to help decrease symptoms of SOB with activities of daily living.    Expected Outcomes Short Term: Improve cardiorespiratory fitness to achieve a reduction of symptoms when performing ADLs;Long Term: Be able to perform more ADLs without symptoms or delay the onset of symptoms    Diabetes Yes    Intervention Provide education about signs/symptoms and action to take for hypo/hyperglycemia.;Provide education about proper nutrition, including hydration, and aerobic/resistive exercise prescription along with prescribed medications to achieve blood glucose in normal ranges: Fasting glucose 65-99 mg/dL    Expected Outcomes Short Term: Participant verbalizes understanding of the signs/symptoms and immediate care of hyper/hypoglycemia, proper foot care and importance of medication, aerobic/resistive exercise and nutrition plan for blood glucose control.;Long Term: Attainment of HbA1C < 7%.    Hypertension Yes    Intervention Provide education on lifestyle  modifcations including regular physical activity/exercise, weight management, moderate sodium restriction and increased consumption of fresh fruit, vegetables, and low fat dairy, alcohol moderation, and smoking cessation.;Monitor prescription use compliance.    Expected Outcomes Short Term: Continued assessment and intervention until BP is < 140/82mm HG in hypertensive participants. < 130/64mm HG in hypertensive participants with diabetes, heart failure or chronic kidney disease.;Long Term: Maintenance of blood pressure at goal levels.    Stress Yes    Intervention Refer participants experiencing significant psychosocial distress to appropriate mental health specialists for further evaluation and treatment. When possible, include family members and significant others in education/counseling sessions.    Expected Outcomes Short Term: Participant demonstrates changes in health-related behavior, relaxation and other stress management skills, ability to obtain effective social support, and compliance with psychotropic medications if prescribed.    Personal Goal Other Yes    Personal Goal Patient wants to decrease his SOB with exertion; improve his muscle strength; and get back to running again.    Intervention Patient will attend CR 3 days/week with exercise and education.    Expected Outcomes Patient will participate in the program until he returns to work meeting both personal and program goals.             Core Components/Risk Factors/Patient Goals Review:   Goals and Risk Factor Review     Row Name 02/21/22 1341 03/17/22 0859           Core Components/Risk Factors/Patient Goals Review   Personal Goals Review Diabetes;Improve shortness of breath with ADL's;Hypertension;Other;Stress;Weight Management/Obesity Diabetes;Improve shortness of breath with ADL's;Hypertension;Other;Stress;Weight Management/Obesity      Review Patient was referred to CR with [facility]. He has multiple risk factors for  CAD and is participating in the program for  risk modification. He plans to start the program Monday 11/6. He DM is managed with Metformin. His personal goals for the program are to decrease his SOB with exertion; improve his muscle strenght and get back to running. We will continue to monitor his progress as he works towards meeting these goals. Patient has completed 8 sessions. His current weight is 251.2 lbs up 3.2 lbs from his intial weight. He is doing well in the program with consistent attendance and progressions. His DM is managed with Metformin. His blood pressures have been above goal. He says he ran out of Metoprolol but plans to get it refilled. We encouaged hiem to do this ASAP. His personal goals for the program continue to be to decrease his SOB with exertion; improve his muscle strenght and get back to running. We will continue to monitor his progress as he works towards meeting these goals.      Expected Outcomes Patient will complete the program meeting both personal and program goals. Patient will complete the program meeting both personal and program goals.               Core Components/Risk Factors/Patient Goals at Discharge (Final Review):   Goals and Risk Factor Review - 03/17/22 0859       Core Components/Risk Factors/Patient Goals Review   Personal Goals Review Diabetes;Improve shortness of breath with ADL's;Hypertension;Other;Stress;Weight Management/Obesity    Review Patient has completed 8 sessions. His current weight is 251.2 lbs up 3.2 lbs from his intial weight. He is doing well in the program with consistent attendance and progressions. His DM is managed with Metformin. His blood pressures have been above goal. He says he ran out of Metoprolol but plans to get it refilled. We encouaged hiem to do this ASAP. His personal goals for the program continue to be to decrease his SOB with exertion; improve his muscle strenght and get back to running. We will continue to monitor  his progress as he works towards meeting these goals.    Expected Outcomes Patient will complete the program meeting both personal and program goals.             ITP Comments:  ITP Comments     Row Name 03/05/22 1010           ITP Comments Pt's HR and BP elevated today. He states that he is out of metoprolol, and that the pharmacy stated that he is out of refills. Message sent to referring provider, Dr. Kipp Brood.                Comments: ITP REVIEW Pt is making expected progress toward Cardiac Rehab goals after completing 12 sessions. Recommend continued exercise, life style modification, education, and increased stamina and strength.

## 2022-03-26 NOTE — Progress Notes (Signed)
Daily Session Note  Patient Details  Name: Jonathan Riley MRN: [id number] Date of Birth: 12/13/1960 Referring Provider:   Flowsheet Row CARDIAC REHAB PHASE II ORIENTATION from 02/20/2022 in [place]  Referring Provider Dr. Kipp Brood       Encounter Date: 03/26/2022  Check In:  Session Check In - 03/26/22 0930       Check-In   Supervising physician immediately available to respond to emergencies [facility] MD immediately available    Physician(s) Dr. Dellia Cloud    Location [facility]    Staff Present Leana Roe, BS, Exercise Physiologist;Mallery Harshman BSN, RN;Debra Wynetta Emery, RN, BSN    Virtual Visit No    Medication changes reported     No    Fall or balance concerns reported    No    Tobacco Cessation No Change    Warm-up and Cool-down Performed as group-led instruction    Resistance Training Performed Yes    VAD Patient? No    PAD/SET Patient? No      Pain Assessment   Currently in Pain? No/denies    Pain Score 0-No pain    Multiple Pain Sites No             Capillary Blood Glucose: No results found for this or any previous visit (from the past 24 hour(s)).    Social History   Tobacco Use  Smoking Status Former   Types: Cigarettes  Smokeless Tobacco Never  Tobacco Comments   "quit about 12 years ago" 02/26/2016    Goals Met:  Independence with exercise equipment Exercise tolerated well No report of concerns or symptoms today Strength training completed today  Goals Unmet:  Not Applicable  Comments: check out at 10:30   Dr. Carlyle Dolly is Medical Director for [facility]

## 2022-03-27 ENCOUNTER — Ambulatory Visit (INDEPENDENT_AMBULATORY_CARE_PROVIDER_SITE_OTHER): Payer: BC Managed Care – PPO | Admitting: Psychology

## 2022-03-27 DIAGNOSIS — F3289 Other specified depressive episodes: Secondary | ICD-10-CM

## 2022-03-27 NOTE — Progress Notes (Signed)
[facility] Counselor/Therapist Progress Note  Patient ID: Jonathan Riley, MRN: [id number]   Date: 03/27/22  Time Spent: 8:04  am - 8:58am : 77 Minutes  Treatment Type: Individual Therapy.  Reported Symptoms: depression and anxiety.   Mental Status Exam: Appearance:  Fairly Groomed     Behavior: Appropriate  Motor: Normal  Speech/Language:  Clear and Coherent  Affect: Congruent  Mood: normal  Thought process: normal  Thought content:   WNL  Sensory/Perceptual disturbances:   WNL  Orientation: oriented to person, place, time/date, and situation  Attention: Good  Concentration: Good  Memory: WNL  Fund of knowledge:  Good  Insight:   Good  Judgment:  Good  Impulse Control: Good   Risk Assessment: Danger to Self:  No Self-injurious Behavior: No Danger to Others: No Duty to Warn:no Physical Aggression / Violence:No  Access to Firearms a concern: No  Gang Involvement:No   Subjective:   Jonathan Riley participated from home, via video and consented to treatment. Therapist participated from home office. We met online due to [place] pandemic. Jonathan Riley reviewed the events of the past week. We reviewed numerous treatment approaches including CBT, BA, Problem Solving, and Solution focused therapy. Psych-education regarding the Jonathan Riley's diagnosis of Other depression was provided during the session. We discussed Jonathan Riley's goals treatment goals which include manage his frustration, how other's perceive his emotions, decrease frustration tolerance, boundaries with self and others, proactively managing mood, increase self-awareness, and process past events and behaviors. Jonathan Riley provided verbal approval of the treatment plan. Therapist provided Jonathan Riley with Jonathan Riley screening tool, via email, to complete between sessions. He noted being prescribed Zoloft in the past and discussed "I was superman, no consequences to my behavior". A bipolar screening is indicated and he  will complete this between sessions.   Interventions: Psycho-education & Goal Setting.   Diagnosis:  Other depression  Psychiatric Treatment: No , history of Zoloft/Xanax.   Treatment Plan:  Client Abilities/Strengths Jonathan Riley is self-aware and motivated for change.   Support System: Family and friends.   Client Treatment Preferences OPT  Client Statement of Needs Jonathan Riley would like to manage his frustration, how other's perceive his emotions, decrease frustration tolerance, boundaries with self and others, proactively managing mood, increase self-awareness, and process past events and behaviors.   Treatment Level Weekly  Symptoms  Depression: loss of interest, feeling down, trouble sleeping, lethargy, fluctuating diet, feeling bad about self, difficulty concentrating.    (Status: maintained) Anxiety:  irritability, feeling anxious, difficulty managing worry, worrying about different things, trouble relaxing, restlessness, feeling afraid something awful might happen.  (Status: maintained)  Goals:   Jonathan Riley experiences symptoms of depression and anxiety.    Target Date: 03/28/23 Frequency: Weekly  Progress: 0 Modality: individual    Therapist will provide referrals for additional resources as appropriate.  Therapist will provide psycho-education regarding Giann's diagnosis and corresponding treatment approaches and interventions. Licensed Clinical Social Worker, Wilcox, LCSW will support the patient's ability to achieve the goals identified. will employ CBT, BA, Problem-solving, Solution Focused, Mindfulness,  coping skills, & other evidenced-based practices will be used to promote progress towards healthy functioning to help manage decrease symptoms associated with his diagnosis.   Reduce overall level, frequency, and intensity of the feelings of depression, anxiety and panic evidenced by decreased  from 6 to 7 days/week to 0 to 1 days/week per client report for at least 3  consecutive months. Verbally express understanding of the relationship between feelings of depression,  anxiety and their impact on thinking patterns and behaviors. Verbalize an understanding of the role that distorted thinking plays in creating fears, excessive worry, and ruminations.   Jonathan Riley participated in the creation of the treatment plan)    Buena Irish, LCSW

## 2022-03-28 ENCOUNTER — Encounter (HOSPITAL_COMMUNITY)
Admission: RE | Admit: 2022-03-28 | Discharge: 2022-03-28 | Disposition: A | Discharge status: Home / Self Care | Payer: BC Managed Care – PPO | Source: Ambulatory Visit | Attending: Thoracic Surgery (Cardiothoracic Vascular Surgery) | Admitting: Thoracic Surgery (Cardiothoracic Vascular Surgery)

## 2022-03-28 DIAGNOSIS — Z951 Presence of aortocoronary bypass graft: Secondary | ICD-10-CM

## 2022-03-28 NOTE — Progress Notes (Signed)
Daily Session Note  Patient Details  Name: Jonathan Riley MRN: [id number] Date of Birth: 1960/06/16 Referring Provider:   Flowsheet Row CARDIAC REHAB PHASE II ORIENTATION from 02/20/2022 in [place]  Referring Provider Dr. Kipp Brood       Encounter Date: 03/28/2022  Check In:  Session Check In - 03/28/22 0935       Check-In   Supervising physician immediately available to respond to emergencies [facility] MD immediately available    Physician(s) Dr. Harl Bowie    Location [facility]    Staff Present Leana Roe, BS, Exercise Physiologist;Debra Wynetta Emery, RN, BSN;Coraima Tibbs, RN;Dalton Sherrie George, MS, ACSM-CEP    Virtual Visit No    Medication changes reported     No    Fall or balance concerns reported    No    Tobacco Cessation No Change    Warm-up and Cool-down Performed as group-led instruction    Resistance Training Performed Yes    VAD Patient? No    PAD/SET Patient? No      Pain Assessment   Currently in Pain? No/denies    Pain Score 0-No pain    Multiple Pain Sites No             Capillary Blood Glucose: No results found for this or any previous visit (from the past 24 hour(s)).    Social History   Tobacco Use  Smoking Status Former   Types: Cigarettes  Smokeless Tobacco Never  Tobacco Comments   "quit about 12 years ago" 02/26/2016    Goals Met:  Independence with exercise equipment Exercise tolerated well No report of concerns or symptoms today Strength training completed today  Goals Unmet:  Not Applicable  Comments: check out @ 10:30am   Dr. Carlyle Dolly is Medical Director for [facility]

## 2022-03-31 ENCOUNTER — Encounter (HOSPITAL_COMMUNITY)
Admission: RE | Admit: 2022-03-31 | Discharge: 2022-03-31 | Disposition: A | Discharge status: Home / Self Care | Payer: BC Managed Care – PPO | Source: Ambulatory Visit | Attending: Thoracic Surgery (Cardiothoracic Vascular Surgery) | Admitting: Thoracic Surgery (Cardiothoracic Vascular Surgery)

## 2022-03-31 DIAGNOSIS — Z951 Presence of aortocoronary bypass graft: Secondary | ICD-10-CM

## 2022-03-31 NOTE — Progress Notes (Signed)
Daily Session Note  Patient Details  Name: Jonathan Riley MRN: [id number] Date of Birth: [birth year] Referring Provider:   Flowsheet Row CARDIAC REHAB PHASE II ORIENTATION from 02/20/2022 in [place]  Referring Provider Dr. Kipp Brood       Encounter Date: 03/31/2022  Check In:  Session Check In - 03/31/22 0930       Check-In   Supervising physician immediately available to respond to emergencies [facility] MD immediately available    Physician(s) Dr Domenic Polite    Location [facility]    Staff Present Leana Roe, BS, Exercise Physiologist;Dalton Sherrie George, MS, ACSM-CEP;Madelyn Flavors, RN, BSN    Virtual Visit No    Medication changes reported     No    Fall or balance concerns reported    No    Tobacco Cessation No Change    Warm-up and Cool-down Performed as group-led instruction    Resistance Training Performed Yes    VAD Patient? No      Pain Assessment   Currently in Pain? No/denies    Pain Score 0-No pain    Multiple Pain Sites No             Capillary Blood Glucose: No results found for this or any previous visit (from the past 24 hour(s)).    Social History   Tobacco Use  Smoking Status Former   Types: Cigarettes  Smokeless Tobacco Never  Tobacco Comments   "quit about 12 years ago" 02/26/2016    Goals Met:  Independence with exercise equipment Exercise tolerated well No report of concerns or symptoms today Strength training completed today  Goals Unmet:  Not Applicable  Comments: Checkout at 1030.   Dr. Carlyle Dolly is Medical Director for [facility]

## 2022-04-01 DIAGNOSIS — E1165 Type 2 diabetes mellitus with hyperglycemia: Secondary | ICD-10-CM | POA: Diagnosis not present

## 2022-04-01 DIAGNOSIS — Z0000 Encounter for general adult medical examination without abnormal findings: Secondary | ICD-10-CM | POA: Diagnosis not present

## 2022-04-01 DIAGNOSIS — E1169 Type 2 diabetes mellitus with other specified complication: Secondary | ICD-10-CM | POA: Diagnosis not present

## 2022-04-02 ENCOUNTER — Encounter (HOSPITAL_COMMUNITY)
Admission: RE | Admit: 2022-04-02 | Discharge: 2022-04-02 | Disposition: A | Discharge status: Home / Self Care | Payer: BC Managed Care – PPO | Source: Ambulatory Visit | Attending: Thoracic Surgery (Cardiothoracic Vascular Surgery) | Admitting: Thoracic Surgery (Cardiothoracic Vascular Surgery)

## 2022-04-02 DIAGNOSIS — Z951 Presence of aortocoronary bypass graft: Secondary | ICD-10-CM | POA: Diagnosis not present

## 2022-04-02 NOTE — Progress Notes (Signed)
Daily Session Note  Patient Details  Name: Jonathan Riley MRN: [id number] Date of Birth: [birth year] Referring Provider:   Flowsheet Row CARDIAC REHAB PHASE II ORIENTATION from 02/20/2022 in [place]  Referring Provider Dr. Kipp Brood       Encounter Date: 04/02/2022  Check In:  Session Check In - 04/02/22 0931       Check-In   Supervising physician immediately available to respond to emergencies [facility] MD immediately available    Physician(s) Dr Domenic Polite    Location [facility]    Staff Present Leana Roe, BS, Exercise Physiologist;Dalton Sherrie George, MS, ACSM-CEP;Madelyn Flavors, RN, BSN;Hillary Troutman BSN, RN    Virtual Visit No    Medication changes reported     No    Fall or balance concerns reported    No    Tobacco Cessation No Change    Warm-up and Cool-down Performed as group-led Higher education careers adviser Performed Yes    VAD Patient? No    PAD/SET Patient? No      Pain Assessment   Currently in Pain? No/denies    Pain Score 0-No pain    Multiple Pain Sites No             Capillary Blood Glucose: No results found for this or any previous visit (from the past 24 hour(s)).    Social History   Tobacco Use  Smoking Status Former   Types: Cigarettes  Smokeless Tobacco Never  Tobacco Comments   "quit about 12 years ago" 02/26/2016    Goals Met:  Independence with exercise equipment Exercise tolerated well No report of concerns or symptoms today Strength training completed today  Goals Unmet:  Not Applicable  Comments: Checkout at 1030.   Dr. Carlyle Dolly is Medical Director for [facility]

## 2022-04-04 ENCOUNTER — Encounter (HOSPITAL_COMMUNITY)
Admission: RE | Admit: 2022-04-04 | Discharge: 2022-04-04 | Disposition: A | Discharge status: Home / Self Care | Payer: BC Managed Care – PPO | Source: Ambulatory Visit | Attending: Thoracic Surgery (Cardiothoracic Vascular Surgery) | Admitting: Thoracic Surgery (Cardiothoracic Vascular Surgery)

## 2022-04-04 DIAGNOSIS — Z951 Presence of aortocoronary bypass graft: Secondary | ICD-10-CM

## 2022-04-04 NOTE — Progress Notes (Signed)
Daily Session Note  Patient Details  Name: Jonathan Riley MRN: [id number] Date of Birth: 09/07/1960 Referring Provider:   Flowsheet Row CARDIAC REHAB PHASE II ORIENTATION from 02/20/2022 in [place] [facility]  Referring Provider Dr. Lightfoot       Encounter Date: 04/04/2022  Check In:  Session Check In - 04/04/22 0930       Check-In   Supervising physician immediately available to respond to emergencies [facility] MD immediately available    Physician(s) Dr. Jonathan Branch    Location [facility]    Staff Present Heather Bailey, BS, Exercise Physiologist;Dalton Fletcher MHA, MS, ACSM-CEP;Debra Johnson, RN, BSN    Virtual Visit No    Medication changes reported     No    Fall or balance concerns reported    No    Tobacco Cessation No Change    Warm-up and Cool-down Performed as group-led instruction    Resistance Training Performed Yes    VAD Patient? No    PAD/SET Patient? No      Pain Assessment   Currently in Pain? No/denies    Pain Score 0-No pain    Multiple Pain Sites No             Capillary Blood Glucose: No results found for this or any previous visit (from the past 24 hour(s)).    Social History   Tobacco Use  Smoking Status Former   Types: Cigarettes  Smokeless Tobacco Never  Tobacco Comments   "quit about 12 years ago" 02/26/2016    Goals Met:  Independence with exercise equipment Exercise tolerated well No report of concerns or symptoms today Strength training completed today  Goals Unmet:  Not Applicable  Comments: Check out 103.   Dr. Jonathan Branch is Medical Director for [facility] 

## 2022-04-07 ENCOUNTER — Encounter (HOSPITAL_COMMUNITY)
Admission: RE | Admit: 2022-04-07 | Discharge: 2022-04-07 | Disposition: A | Discharge status: Home / Self Care | Payer: BC Managed Care – PPO | Source: Ambulatory Visit | Attending: Thoracic Surgery (Cardiothoracic Vascular Surgery) | Admitting: Thoracic Surgery (Cardiothoracic Vascular Surgery)

## 2022-04-07 VITALS — Wt 253.5 lb

## 2022-04-07 DIAGNOSIS — Z951 Presence of aortocoronary bypass graft: Secondary | ICD-10-CM | POA: Diagnosis not present

## 2022-04-07 NOTE — Progress Notes (Signed)
Daily Session Note  Patient Details  Name: Jonathan Riley MRN: [id number] Date of Birth: [birth year] Referring Provider:   Flowsheet Row CARDIAC REHAB PHASE II ORIENTATION from 02/20/2022 in [place]  Referring Provider Dr. Kipp Brood       Encounter Date: 04/07/2022  Check In:  Session Check In - 04/07/22 0916       Check-In   Supervising physician immediately available to respond to emergencies [facility] MD immediately available    Physician(s) Dr Dellia Cloud    Location [facility]    Staff Present Leana Roe, BS, Exercise Physiologist;Dalton Sherrie George, MS, ACSM-CEP;Madelyn Flavors, RN, BSN    Virtual Visit No    Medication changes reported     No    Fall or balance concerns reported    No    Tobacco Cessation No Change    Warm-up and Cool-down Performed as group-led instruction    Resistance Training Performed Yes    VAD Patient? No    PAD/SET Patient? No      Pain Assessment   Currently in Pain? No/denies    Pain Score 0-No pain    Multiple Pain Sites No             Capillary Blood Glucose: No results found for this or any previous visit (from the past 24 hour(s)).    Social History   Tobacco Use  Smoking Status Former   Types: Cigarettes  Smokeless Tobacco Never  Tobacco Comments   "quit about 12 years ago" 02/26/2016    Goals Met:  Independence with exercise equipment Exercise tolerated well No report of concerns or symptoms today Strength training completed today  Goals Unmet:  Not Applicable  Comments: Checkout at 1030.   Dr. Carlyle Dolly is Medical Director for [facility]

## 2022-04-09 ENCOUNTER — Encounter (HOSPITAL_COMMUNITY)
Admission: RE | Admit: 2022-04-09 | Discharge: 2022-04-09 | Disposition: A | Discharge status: Home / Self Care | Payer: BC Managed Care – PPO | Source: Ambulatory Visit | Attending: Thoracic Surgery (Cardiothoracic Vascular Surgery) | Admitting: Thoracic Surgery (Cardiothoracic Vascular Surgery)

## 2022-04-09 DIAGNOSIS — Z951 Presence of aortocoronary bypass graft: Secondary | ICD-10-CM

## 2022-04-09 LAB — GLUCOSE, CAPILLARY: Glucose-Capillary: 103 mg/dL — ABNORMAL HIGH (ref 70–99)

## 2022-04-09 NOTE — Progress Notes (Signed)
Daily Session Note  Patient Details  Name: Jonathan Riley MRN: [id number] Date of Birth: 11-26-1960 Referring Provider:   Flowsheet Row CARDIAC REHAB PHASE II ORIENTATION from 02/20/2022 in [place]  Referring Provider Dr. Kipp Brood       Encounter Date: 04/09/2022  Check In:  Session Check In - 04/09/22 0930       Check-In   Supervising physician immediately available to respond to emergencies [facility] MD immediately available    Physician(s) Dr Dellia Cloud    Location [facility]    Staff Present Leana Roe, BS, Exercise Physiologist;Dalton Sherrie George, MS, ACSM-CEP;Melven Sartorius BSN, RN    Medication changes reported     No    Fall or balance concerns reported    No    Tobacco Cessation No Change    Warm-up and Cool-down Performed as group-led Higher education careers adviser Performed Yes    VAD Patient? No    PAD/SET Patient? No      Pain Assessment   Currently in Pain? No/denies   pt is just sore from weightlifting   Pain Score 0-No pain    Multiple Pain Sites No             Capillary Blood Glucose: No results found for this or any previous visit (from the past 24 hour(s)).    Social History   Tobacco Use  Smoking Status Former   Types: Cigarettes  Smokeless Tobacco Never  Tobacco Comments   "quit about 12 years ago" 02/26/2016    Goals Met:  Independence with exercise equipment Exercise tolerated well No report of concerns or symptoms today Strength training completed today  Goals Unmet:  Not Applicable  Comments: check out at 10:30   Dr. Carlyle Dolly is Medical Director for [place]

## 2022-04-11 ENCOUNTER — Ambulatory Visit: Payer: BC Managed Care – PPO | Admitting: Thoracic Surgery (Cardiothoracic Vascular Surgery)

## 2022-04-11 ENCOUNTER — Encounter (HOSPITAL_COMMUNITY)
Admission: RE | Admit: 2022-04-11 | Discharge: 2022-04-11 | Disposition: A | Discharge status: Home / Self Care | Payer: BC Managed Care – PPO | Source: Ambulatory Visit | Attending: Thoracic Surgery (Cardiothoracic Vascular Surgery) | Admitting: Thoracic Surgery (Cardiothoracic Vascular Surgery)

## 2022-04-11 DIAGNOSIS — Z951 Presence of aortocoronary bypass graft: Secondary | ICD-10-CM | POA: Diagnosis not present

## 2022-04-11 NOTE — Progress Notes (Signed)
Daily Session Note  Patient Details  Name: Jonathan Riley MRN: [id number] Date of Birth: 1960/08/02 Referring Provider:   Flowsheet Row CARDIAC REHAB PHASE II ORIENTATION from 02/20/2022 in [place]  Referring Provider Dr. Kipp Brood       Encounter Date: 04/11/2022  Check In:  Session Check In - 04/11/22 0920       Check-In   Supervising physician immediately available to respond to emergencies [facility] MD immediately available    Physician(s) Dr Dellia Cloud    Location [facility]    Staff Present Leana Roe, BS, Exercise Physiologist;Dalton Sherrie George, MS, ACSM-CEP;Madelyn Flavors, RN, BSN    Virtual Visit No    Medication changes reported     No    Fall or balance concerns reported    No    Tobacco Cessation No Change    Warm-up and Cool-down Performed as group-led instruction    Resistance Training Performed Yes    VAD Patient? No    PAD/SET Patient? No      Pain Assessment   Currently in Pain? No/denies    Pain Score 0-No pain    Multiple Pain Sites No             Capillary Blood Glucose: No results found for this or any previous visit (from the past 24 hour(s)).    Social History   Tobacco Use  Smoking Status Former   Types: Cigarettes  Smokeless Tobacco Never  Tobacco Comments   "quit about 12 years ago" 02/26/2016    Goals Met:  Proper associated with RPD/PD & O2 Sat Exercise tolerated well No report of concerns or symptoms today Strength training completed today  Goals Unmet:  Not Applicable  Comments: Checkout at 1030.   Dr. Carlyle Dolly is Medical Director for [facility]

## 2022-04-14 ENCOUNTER — Encounter (HOSPITAL_COMMUNITY): Payer: BC Managed Care – PPO

## 2022-04-16 ENCOUNTER — Encounter (HOSPITAL_COMMUNITY)
Admission: RE | Admit: 2022-04-16 | Discharge: 2022-04-16 | Disposition: A | Discharge status: Home / Self Care | Payer: BC Managed Care – PPO | Source: Ambulatory Visit | Attending: Thoracic Surgery (Cardiothoracic Vascular Surgery) | Admitting: Thoracic Surgery (Cardiothoracic Vascular Surgery)

## 2022-04-16 DIAGNOSIS — Z951 Presence of aortocoronary bypass graft: Secondary | ICD-10-CM | POA: Diagnosis not present

## 2022-04-16 NOTE — Progress Notes (Signed)
Daily Session Note  Patient Details  Name: Jonathan Riley MRN: [id number] Date of Birth: Nov 02, 1960 Referring Provider:   Flowsheet Row CARDIAC REHAB PHASE II ORIENTATION from 02/20/2022 in [place]  Referring Provider Dr. Kipp Brood       Encounter Date: 04/16/2022  Check In:  Session Check In - 04/16/22 0930       Check-In   Supervising physician immediately available to respond to emergencies [facility] MD immediately available    Physician(s) Dr. Johnsie Cancel    Location [facility]    Staff Present Melven Sartorius BSN, RN;Kenslei Hearty Sherrie George, MS, ACSM-CEP;Leana Roe, BS, Exercise Physiologist    Virtual Visit No    Medication changes reported     No    Fall or balance concerns reported    No    Tobacco Cessation No Change    Warm-up and Cool-down Performed as group-led instruction    Resistance Training Performed Yes    VAD Patient? No    PAD/SET Patient? No      Pain Assessment   Currently in Pain? No/denies    Pain Score 0-No pain    Multiple Pain Sites No             Capillary Blood Glucose: No results found for this or any previous visit (from the past 24 hour(s)).    Social History   Tobacco Use  Smoking Status Former   Types: Cigarettes  Smokeless Tobacco Never  Tobacco Comments   "quit about 12 years ago" 02/26/2016    Goals Met:  Independence with exercise equipment Exercise tolerated well No report of concerns or symptoms today Strength training completed today  Goals Unmet:  Not Applicable  Comments: checkout time is 1030   Dr. Carlyle Dolly is Medical Director for [facility]

## 2022-04-18 ENCOUNTER — Encounter (HOSPITAL_COMMUNITY): Payer: BC Managed Care – PPO

## 2022-04-21 ENCOUNTER — Encounter (HOSPITAL_COMMUNITY): Payer: BC Managed Care – PPO

## 2022-04-23 ENCOUNTER — Encounter (HOSPITAL_COMMUNITY)
Admission: RE | Admit: 2022-04-23 | Discharge: 2022-04-23 | Disposition: A | Discharge status: Home / Self Care | Payer: BC Managed Care – PPO | Source: Ambulatory Visit | Attending: Thoracic Surgery (Cardiothoracic Vascular Surgery) | Admitting: Thoracic Surgery (Cardiothoracic Vascular Surgery)

## 2022-04-23 DIAGNOSIS — Z951 Presence of aortocoronary bypass graft: Secondary | ICD-10-CM | POA: Insufficient documentation

## 2022-04-23 NOTE — Progress Notes (Signed)
Cardiac Individual Treatment Plan  Patient Details  Name: Jonathan Riley MRN: [id number] Date of Birth: 31-Jul-1960 Referring Provider:   Flowsheet Row CARDIAC REHAB PHASE II ORIENTATION from 02/20/2022 in [facility] CARDIAC REHABILITATION  Referring Provider Dr. Cliffton Asters       Initial Encounter Date:  Flowsheet Row CARDIAC REHAB PHASE II ORIENTATION from 02/20/2022 in [place] CARDIAC REHABILITATION  Date 02/20/22       Visit Diagnosis: S/P CABG x 5  Patient's Home Medications on Admission:  Current Outpatient Medications:    acetaminophen (TYLENOL) 500 MG tablet, Take 1,000 mg by mouth every 6 (six) hours as needed (pain.)., Disp: , Rfl:    amLODipine (NORVASC) 10 MG tablet, Take 1 tablet (10 mg total) by mouth daily., Disp: 30 tablet, Rfl: 1   aspirin EC 325 MG tablet, Take 1 tablet (325 mg total) by mouth daily., Disp: , Rfl:    atorvastatin (LIPITOR) 80 MG tablet, Take 1 tablet (80 mg total) by mouth daily., Disp: 90 tablet, Rfl: 1   diphenhydrAMINE (BENADRYL) 25 mg capsule, Take 25 mg by mouth at bedtime as needed for sleep., Disp: , Rfl:    ibuprofen (ADVIL) 200 MG tablet, Take 400 mg by mouth every 8 (eight) hours as needed (pain.)., Disp: , Rfl:    metFORMIN (GLUCOPHAGE-XR) 500 MG 24 hr tablet, Take 500 mg by mouth daily., Disp: , Rfl:    metoprolol tartrate (LOPRESSOR) 25 MG tablet, Take 1 tablet (25 mg total) by mouth 2 (two) times daily., Disp: 180 tablet, Rfl: 1   sildenafil (REVATIO) 20 MG tablet, Take 40-100 mg by mouth daily as needed (erectile dysfunction)., Disp: , Rfl:    Sodium Bicarbonate (NICE PURE BAKING SODA) POWD, Take 1 Dose by mouth 2 (two) times daily as needed (acid reflux/indigestion). Baking Soda Mixed with water if needed for reflux/indigestion., Disp: , Rfl:    telmisartan (MICARDIS) 80 MG tablet, Take 80 mg by mouth daily., Disp: , Rfl:   Past Medical History: Past Medical History:  Diagnosis Date   Arthritis    Depression    Diabetes  mellitus without complication (HCC)    Dyspnea    "with exertion"   Hypertension    Nocturia    1-2 times during night   PONV (postoperative nausea and vomiting)    "nausea after a knee surgery"    Tobacco Use: Social History   Tobacco Use  Smoking Status Former   Types: Cigarettes  Smokeless Tobacco Never  Tobacco Comments   "quit about 12 years ago" 02/26/2016    Labs: Review Flowsheet  More data may exist      Latest Ref Rng & Units 12/10/2021 12/20/2021 12/24/2021 12/25/2021 03/18/2022  Labs for ITP Cardiac and Pulmonary Rehab  Cholestrol 100 - 199 mg/dL 938  - - - 182   LDL (calc) 0 - 99 mg/dL 993  - - - 77   HDL-C >71 mg/dL 30  - - - 34   Trlycerides 0 - 149 mg/dL 696  - - - 789   Hemoglobin A1c 4.8 - 5.6 % - 7.4  - - -  PH, Arterial 7.35 - 7.45 - - 7.273  7.257  7.328  7.334  7.326  7.316  7.301  7.329  7.305  7.251  7.306  -  PCO2 arterial 32 - 48 mmHg - - 42.3  49.4  44.5  40.4  46.0  48.2  51.4  35.3  36.1  40.1  37.3  -  Bicarbonate 20.0 -  28.0 mmol/L - - 19.5  22.0  23.3  21.5  24.0  24.6  26.2  25.4  18.6  17.9  17.2  18.4  -  TCO2 22 - 32 mmol/L - - 21  22  23  25  23  23  25  26  26  28  27  24  27  20  19  18  19   -  Acid-base deficit 0.0 - 2.0 mmol/L - - 7.0  5.0  3.0  4.0  2.0  2.0  1.0  1.0  7.0  8.0  9.0  7.0  -  O2 Saturation % - - 99  91  100  100  100  100  71  100  97  97  93  91  -    Capillary Blood Glucose: Lab Results  Component Value Date   GLUCAP 103 (H) 04/09/2022   GLUCAP 126 (H) 12/30/2021   GLUCAP 153 (H) 12/29/2021   GLUCAP 193 (H) 12/29/2021   GLUCAP 159 (H) 12/29/2021    POCT Glucose     Row Name 04/16/22 0930             POCT Blood Glucose   Pre-Exercise 126 mg/dL                Exercise Target Goals: Exercise Program Goal: Individual exercise prescription set using results from initial 6 min walk test and THRR while considering  patient's activity barriers and safety.   Exercise Prescription Goal: Starting with  aerobic activity 30 plus minutes a day, 3 days per week for initial exercise prescription. Provide home exercise prescription and guidelines that participant acknowledges understanding prior to discharge.  Activity Barriers & Risk Stratification:  Activity Barriers & Cardiac Risk Stratification - 02/20/22 1311       Activity Barriers & Cardiac Risk Stratification   Activity Barriers Left Knee Replacement;Right Hip Replacement;Shortness of Breath    Cardiac Risk Stratification High             6 Minute Walk:  6 Minute Walk     Row Name 02/20/22 1408         6 Minute Walk   Phase Initial     Distance 1950 feet     Walk Time 6 minutes     # of Rest Breaks 0     MPH 3.69     METS 4.69     RPE 11     VO2 Peak 16.43     Symptoms No     Resting HR 73 bpm     Resting BP 150/70     Resting Oxygen Saturation  94 %     Exercise Oxygen Saturation  during 6 min walk 97 %     Max Ex. HR 120 bpm     Max Ex. BP 180/66     2 Minute Post BP 160/62              Oxygen Initial Assessment:   Oxygen Re-Evaluation:   Oxygen Discharge (Final Oxygen Re-Evaluation):   Initial Exercise Prescription:  Initial Exercise Prescription - 02/20/22 1400       Date of Initial Exercise RX and Referring Provider   Date 02/20/22    Referring Provider Dr. 13/02/23    Expected Discharge Date 05/14/22      Treadmill   MPH 2.5    Grade 0    Minutes 17      NuStep   Level 1  SPM 60    Minutes 22      Prescription Details   Frequency (times per week) 3    Duration Progress to 30 minutes of continuous aerobic without signs/symptoms of physical distress      Intensity   THRR 40-80% of Max Heartrate 64-127    Ratings of Perceived Exertion 11-13      Resistance Training   Training Prescription Yes    Weight 4    Reps 10-15             Perform Capillary Blood Glucose checks as needed.  Exercise Prescription Changes:   Exercise Prescription Changes     Row Name  02/24/22 1500 03/05/22 0900 03/10/22 1200 03/24/22 1300 04/07/22 1200     Response to Exercise   Blood Pressure (Admit) 128/74 -- 134/70 128/60 138/72   Blood Pressure (Exercise) 160/60 -- 180/76 178/70 180/78   Blood Pressure (Exit) 130/68 -- 124/62 132/60 96/54   Heart Rate (Admit) 67 bpm -- 66 bpm 62 bpm 71 bpm   Heart Rate (Exercise) 104 bpm -- 113 bpm 106 bpm 102 bpm   Heart Rate (Exit) 76 bpm -- 75 bpm 71 bpm 78 bpm   Rating of Perceived Exertion (Exercise) 12 -- 12 11 12    Duration Continue with 30 min of aerobic exercise without signs/symptoms of physical distress. -- Continue with 30 min of aerobic exercise without signs/symptoms of physical distress. Continue with 30 min of aerobic exercise without signs/symptoms of physical distress. Continue with 30 min of aerobic exercise without signs/symptoms of physical distress.   Intensity THRR unchanged -- THRR unchanged THRR unchanged THRR unchanged     Progression   Progression Continue to progress workloads to maintain intensity without signs/symptoms of physical distress. -- Continue to progress workloads to maintain intensity without signs/symptoms of physical distress. Continue to progress workloads to maintain intensity without signs/symptoms of physical distress. Continue to progress workloads to maintain intensity without signs/symptoms of physical distress.     Resistance Training   Training Prescription Yes -- Yes Yes Yes   Weight 4 -- 5 10 10    Reps 10-15 -- 10-15 10-15 10-15   Time 10 Minutes -- 10 Minutes 10 Minutes 10 Minutes     Treadmill   MPH 3 -- 3.5 3.3 3.2   Grade 0 -- 1.5 3 5    Minutes 17 -- 22 22 22    METs 3.3 -- 4.4 4.89 5.66     NuStep   Level 3 -- -- -- --   SPM 157 -- -- -- --   Minutes 22 -- -- -- --   METs 7.81 -- -- -- --     Recumbant Elliptical   Level -- -- 5 7 8    RPM -- -- 83 66 72   Minutes -- -- 17 17 17    METs -- -- 5.1 7.5 8.9     Home Exercise Plan   Plans to continue exercise at --  Home (comment) -- -- --   Frequency -- Add 2 additional days to program exercise sessions. -- -- --   Initial Home Exercises Provided -- 03/05/22 -- -- --    Row Name 04/23/22 0700             Response to Exercise   Blood Pressure (Admit) 134/62       Blood Pressure (Exercise) 172/64       Blood Pressure (Exit) 120/64       Heart Rate (Admit) 64 bpm  Heart Rate (Exercise) 100 bpm       Heart Rate (Exit) 76 bpm       Rating of Perceived Exertion (Exercise) 11       Duration Continue with 30 min of aerobic exercise without signs/symptoms of physical distress.       Intensity THRR unchanged         Progression   Progression Continue to progress workloads to maintain intensity without signs/symptoms of physical distress.         Resistance Training   Training Prescription Yes       Weight 10       Reps 10-15       Time 10 Minutes         Treadmill   MPH 3.1       Grade 4.5       Minutes 22       METs 5.3         Recumbant Elliptical   Level 8       RPM 71       Minutes 17       METs 5.3                Exercise Comments:   Exercise Comments     Row Name 03/05/22 0958           Exercise Comments Home exercise reviewed                Exercise Goals and Review:   Exercise Goals     Row Name 02/20/22 1414 02/24/22 1510 03/24/22 1324 04/23/22 0724       Exercise Goals   Increase Physical Activity Yes Yes Yes Yes    Intervention Provide advice, education, support and counseling about physical activity/exercise needs.;Develop an individualized exercise prescription for aerobic and resistive training based on initial evaluation findings, risk stratification, comorbidities and participant's personal goals. Provide advice, education, support and counseling about physical activity/exercise needs.;Develop an individualized exercise prescription for aerobic and resistive training based on initial evaluation findings, risk stratification, comorbidities and  participant's personal goals. Provide advice, education, support and counseling about physical activity/exercise needs.;Develop an individualized exercise prescription for aerobic and resistive training based on initial evaluation findings, risk stratification, comorbidities and participant's personal goals. Provide advice, education, support and counseling about physical activity/exercise needs.;Develop an individualized exercise prescription for aerobic and resistive training based on initial evaluation findings, risk stratification, comorbidities and participant's personal goals.    Expected Outcomes Short Term: Attend rehab on a regular basis to increase amount of physical activity.;Long Term: Exercising regularly at least 3-5 days a week.;Long Term: Add in home exercise to make exercise part of routine and to increase amount of physical activity. Short Term: Attend rehab on a regular basis to increase amount of physical activity.;Long Term: Exercising regularly at least 3-5 days a week.;Long Term: Add in home exercise to make exercise part of routine and to increase amount of physical activity. Short Term: Attend rehab on a regular basis to increase amount of physical activity.;Long Term: Exercising regularly at least 3-5 days a week.;Long Term: Add in home exercise to make exercise part of routine and to increase amount of physical activity. Short Term: Attend rehab on a regular basis to increase amount of physical activity.;Long Term: Exercising regularly at least 3-5 days a week.;Long Term: Add in home exercise to make exercise part of routine and to increase amount of physical activity.    Increase Strength and Stamina Yes Yes  Yes Yes    Intervention Provide advice, education, support and counseling about physical activity/exercise needs.;Develop an individualized exercise prescription for aerobic and resistive training based on initial evaluation findings, risk stratification, comorbidities and  participant's personal goals. Provide advice, education, support and counseling about physical activity/exercise needs.;Develop an individualized exercise prescription for aerobic and resistive training based on initial evaluation findings, risk stratification, comorbidities and participant's personal goals. Provide advice, education, support and counseling about physical activity/exercise needs.;Develop an individualized exercise prescription for aerobic and resistive training based on initial evaluation findings, risk stratification, comorbidities and participant's personal goals. Provide advice, education, support and counseling about physical activity/exercise needs.;Develop an individualized exercise prescription for aerobic and resistive training based on initial evaluation findings, risk stratification, comorbidities and participant's personal goals.    Expected Outcomes Short Term: Increase workloads from initial exercise prescription for resistance, speed, and METs.;Short Term: Perform resistance training exercises routinely during rehab and add in resistance training at home;Long Term: Improve cardiorespiratory fitness, muscular endurance and strength as measured by increased METs and functional capacity ( ) Short Term: Increase workloads from initial exercise prescription for resistance, speed, and METs.;Short Term: Perform resistance training exercises routinely during rehab and add in resistance training at home;Long Term: Improve cardiorespiratory fitness, muscular endurance and strength as measured by increased METs and functional capacity ( ) Short Term: Increase workloads from initial exercise prescription for resistance, speed, and METs.;Short Term: Perform resistance training exercises routinely during rehab and add in resistance training at home;Long Term: Improve cardiorespiratory fitness, muscular endurance and strength as measured by increased METs and functional capacity ( ) Short  Term: Increase workloads from initial exercise prescription for resistance, speed, and METs.;Short Term: Perform resistance training exercises routinely during rehab and add in resistance training at home;Long Term: Improve cardiorespiratory fitness, muscular endurance and strength as measured by increased METs and functional capacity ( )    Able to understand and use rate of perceived exertion (RPE) scale Yes Yes Yes Yes    Intervention Provide education and explanation on how to use RPE scale Provide education and explanation on how to use RPE scale Provide education and explanation on how to use RPE scale Provide education and explanation on how to use RPE scale    Expected Outcomes Short Term: Able to use RPE daily in rehab to express subjective intensity level;Long Term:  Able to use RPE to guide intensity level when exercising independently Short Term: Able to use RPE daily in rehab to express subjective intensity level;Long Term:  Able to use RPE to guide intensity level when exercising independently Short Term: Able to use RPE daily in rehab to express subjective intensity level;Long Term:  Able to use RPE to guide intensity level when exercising independently Short Term: Able to use RPE daily in rehab to express subjective intensity level;Long Term:  Able to use RPE to guide intensity level when exercising independently    Knowledge and understanding of Target Heart Rate Range (THRR) Yes Yes Yes Yes    Intervention Provide education and explanation of THRR including how the numbers were predicted and where they are located for reference Provide education and explanation of THRR including how the numbers were predicted and where they are located for reference Provide education and explanation of THRR including how the numbers were predicted and where they are located for reference Provide education and explanation of THRR including how the numbers were predicted and where they are located for  reference    Expected Outcomes Short Term: Able to state/look up  THRR;Long Term: Able to use THRR to govern intensity when exercising independently;Short Term: Able to use daily as guideline for intensity in rehab Short Term: Able to state/look up THRR;Long Term: Able to use THRR to govern intensity when exercising independently;Short Term: Able to use daily as guideline for intensity in rehab Short Term: Able to state/look up THRR;Long Term: Able to use THRR to govern intensity when exercising independently;Short Term: Able to use daily as guideline for intensity in rehab Short Term: Able to state/look up THRR;Long Term: Able to use THRR to govern intensity when exercising independently;Short Term: Able to use daily as guideline for intensity in rehab    Able to check pulse independently Yes Yes Yes Yes    Intervention Provide education and demonstration on how to check pulse in carotid and radial arteries.;Review the importance of being able to check your own pulse for safety during independent exercise Provide education and demonstration on how to check pulse in carotid and radial arteries.;Review the importance of being able to check your own pulse for safety during independent exercise Provide education and demonstration on how to check pulse in carotid and radial arteries.;Review the importance of being able to check your own pulse for safety during independent exercise Provide education and demonstration on how to check pulse in carotid and radial arteries.;Review the importance of being able to check your own pulse for safety during independent exercise    Expected Outcomes Short Term: Able to explain why pulse checking is important during independent exercise;Long Term: Able to check pulse independently and accurately Short Term: Able to explain why pulse checking is important during independent exercise;Long Term: Able to check pulse independently and accurately Short Term: Able to explain why pulse  checking is important during independent exercise;Long Term: Able to check pulse independently and accurately Short Term: Able to explain why pulse checking is important during independent exercise;Long Term: Able to check pulse independently and accurately    Understanding of Exercise Prescription Yes Yes Yes Yes    Intervention Provide education, explanation, and written materials on patient's individual exercise prescription Provide education, explanation, and written materials on patient's individual exercise prescription Provide education, explanation, and written materials on patient's individual exercise prescription Provide education, explanation, and written materials on patient's individual exercise prescription    Expected Outcomes Short Term: Able to explain program exercise prescription;Long Term: Able to explain home exercise prescription to exercise independently Short Term: Able to explain program exercise prescription;Long Term: Able to explain home exercise prescription to exercise independently Short Term: Able to explain program exercise prescription;Long Term: Able to explain home exercise prescription to exercise independently Short Term: Able to explain program exercise prescription;Long Term: Able to explain home exercise prescription to exercise independently             Exercise Goals Re-Evaluation :  Exercise Goals Re-Evaluation     Row Name 02/24/22 1510 03/24/22 1327 04/23/22 0725         Exercise Goal Re-Evaluation   Exercise Goals Review Increase Physical Activity;Able to understand and use rate of perceived exertion (RPE) scale;Increase Strength and Stamina;Knowledge and understanding of Target Heart Rate Range (THRR);Able to check pulse independently;Understanding of Exercise Prescription Increase Physical Activity;Increase Strength and Stamina;Able to understand and use rate of perceived exertion (RPE) scale;Knowledge and understanding of Target Heart Rate Range  (THRR);Able to check pulse independently;Understanding of Exercise Prescription Increase Physical Activity;Increase Strength and Stamina;Able to understand and use rate of perceived exertion (RPE) scale;Knowledge and understanding of Target Heart  Rate Range (THRR);Able to check pulse independently;Understanding of Exercise Prescription     Comments Pt has completed his first session of cardiac rehab. He is very motivated and ready to get started. He is currently exercising at 7.81 METs on the stepper. Will contineut to monitor and progress as able. Pt has completed 11 sessions of cardiac rehab. He continues to be very motivated during rehab. He is hoping to return to work in a month. He is curretnly exercising at 7.5 METs on the ellp. Will continue to monitor and progress as able. Pt has completed 20 sessions of cardiac rehab. He is motivated while in rehab and pushes himself. He may have to go back to work next week, but he is hopeful that he surgeon will write him out for another month, so he can attend another month of rehab. He has a very physical job, so he is worried that he is not quite ready yet. He is currently exercising at 5.3 METs on the elliptical. Will continue to monitor and progress as able.     Expected Outcomes Through exercise at rehab and home, the patient will meet their stated goals. Through exercise at rehab and home, the patient will meet their stated goals. Through exercise at rehab and home, the patient will meet their stated goals.               Discharge Exercise Prescription (Final Exercise Prescription Changes):  Exercise Prescription Changes - 04/23/22 0700       Response to Exercise   Blood Pressure (Admit) 134/62    Blood Pressure (Exercise) 172/64    Blood Pressure (Exit) 120/64    Heart Rate (Admit) 64 bpm    Heart Rate (Exercise) 100 bpm    Heart Rate (Exit) 76 bpm    Rating of Perceived Exertion (Exercise) 11    Duration Continue with 30 min of aerobic  exercise without signs/symptoms of physical distress.    Intensity THRR unchanged      Progression   Progression Continue to progress workloads to maintain intensity without signs/symptoms of physical distress.      Resistance Training   Training Prescription Yes    Weight 10    Reps 10-15    Time 10 Minutes      Treadmill   MPH 3.1    Grade 4.5    Minutes 22    METs 5.3      Recumbant Elliptical   Level 8    RPM 71    Minutes 17    METs 5.3             Nutrition:  Target Goals: Understanding of nutrition guidelines, daily intake of sodium 1500mg , cholesterol 200mg , calories 30% from fat and 7% or less from saturated fats, daily to have 5 or more servings of fruits and vegetables.  Biometrics:  Pre Biometrics - 02/20/22 1415       Pre Biometrics   Height 5\' 10"  (1.778 m)    Weight 112.4 kg    Waist Circumference 44 inches    Hip Circumference 41 inches    Waist to Hip Ratio 1.07 %    BMI (Calculated) 35.56    Triceps Skinfold 30 mm    % Body Fat 34.7 %    Grip Strength 33 kg    Flexibility 0 in    Single Leg Stand 45 seconds              Nutrition Therapy Plan and Nutrition Goals:  Nutrition Therapy & Goals - 02/20/22 1407       Personal Nutrition Goals   Comments Patient scored 46 on his diet assessment. Handouts on healthier choices and DM information provided and explained. We offer 2 educational sessions on heart healthy nutrition with handouts and assistance with RD referral if patient is interested.      Intervention Plan   Intervention Nutrition handout(s) given to patient.    Expected Outcomes Short Term Goal: Understand basic principles of dietary content, such as calories, fat, sodium, cholesterol and nutrients.             Nutrition Assessments:  Nutrition Assessments - 02/20/22 1406       MEDFICTS Scores   Pre Score 46            MEDIFICTS Score Key: ?70 Need to make dietary changes  40-70 Heart Healthy Diet ? 40  Therapeutic Level Cholesterol Diet   Picture Your Plate Scores: <16 Unhealthy dietary pattern with much room for improvement. 41-50 Dietary pattern unlikely to meet recommendations for good health and room for improvement. 51-60 More healthful dietary pattern, with some room for improvement.  >60 Healthy dietary pattern, although there may be some specific behaviors that could be improved.    Nutrition Goals Re-Evaluation:   Nutrition Goals Discharge (Final Nutrition Goals Re-Evaluation):   Psychosocial: Target Goals: Acknowledge presence or absence of significant depression and/or stress, maximize coping skills, provide positive support system. Participant is able to verbalize types and ability to use techniques and skills needed for reducing stress and depression.  Initial Review & Psychosocial Screening:  Initial Psych Review & Screening - 02/20/22 1413       Initial Review   Current issues with Current Stress Concerns      Family Dynamics   Good Support System? Yes      Barriers   Psychosocial barriers to participate in program The patient should benefit from training in stress management and relaxation.;There are no identifiable barriers or psychosocial needs.      Screening Interventions   Interventions Encouraged to exercise;To provide support and resources with identified psychosocial needs;Provide feedback about the scores to participant    Expected Outcomes Short Term goal: Identification and review with participant of any Quality of Life or Depression concerns found by scoring the questionnaire.             Quality of Life Scores:  Quality of Life - 02/20/22 1416       Quality of Life   Select Quality of Life      Quality of Life Scores   Health/Function Pre 15.4 %    Socioeconomic Pre 16.5 %    Psych/Spiritual Pre 14.71 %    Family Pre 17 %    GLOBAL Pre 15.74 %            Scores of 19 and below usually indicate a poorer quality of life in  these areas.  A difference of  2-3 points is a clinically meaningful difference.  A difference of 2-3 points in the total score of the Quality of Life Index has been associated with significant improvement in overall quality of life, self-image, physical symptoms, and general health in studies assessing change in quality of life.  PHQ-9: Review Flowsheet       02/20/2022 02/12/2017  Depression screen PHQ 2/9  Decreased Interest 2 0  Down, Depressed, Hopeless 1 0  PHQ - 2 Score 3 0  Altered sleeping 3 -  Tired,  decreased energy 2 -  Change in appetite 3 -  Feeling bad or failure about yourself  1 -  Trouble concentrating 1 -  Moving slowly or fidgety/restless 0 -  Suicidal thoughts 0 -  PHQ-9 Score 13 -  Difficult doing work/chores Not difficult at all -   Interpretation of Total Score  Total Score Depression Severity:  1-4 = Minimal depression, 5-9 = Mild depression, 10-14 = Moderate depression, 15-19 = Moderately severe depression, 20-27 = Severe depression   Psychosocial Evaluation and Intervention:  Psychosocial Evaluation - 02/21/22 1338       Psychosocial Evaluation & Interventions   Interventions Stress management education;Relaxation education;Encouraged to exercise with the program and follow exercise prescription    Comments Patient has no psychosocial barriers to participate in CR identified at his orientation visit. He will not be able to complete all 12 weeks due to returning to work. He is not sure what that date is but possibly 12/4 but he wants to do as much of the program as he can. His initial PHQ-9 score was 13 and overall QOL was 15.74%. He says he has had a lot of health issues in the past few years. 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 He says he does feel depressed at times and his wife has noticed a  change in his mood and personality and wanted him to see a therapist which he has made an appointment. He reports having a very difficult time with the company he works with getting short term disability which has been a stressor for him. He is also concerned about being released to return to work on 12/4. 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 He has an appointment with Dr. Cliffton AstersLightfoot 12/1 to discuss his return to work and I encouraged him to share his concerns with Dr. Cliffton AstersLightfoot. He agreed he would. He has a great support system with his wife. He is ready to start the program and wants to do as much of it as he is able before returning to work.    Expected Outcomes Patient will continue to have no psychosocial barriers identified.    Continue Psychosocial Services  No Follow up required             Psychosocial Re-Evaluation:  Psychosocial Re-Evaluation     Row Name 02/20/22 1413 02/20/22 1416 02/21/22 1339 03/17/22 0857 04/16/22 0837     Psychosocial Re-Evaluation   Current issues with -- -- Current Stress Concerns Current Stress Concerns Current Depression   Comments -- -- Patient is new the program and plans to start Monday 11/6. We will continue to monitor his progress in the program. Patient has completed 8 sessions. He continues to have no psychosocial barriers identified. He did see a counselor 11/21 for feeling of depression and at the request of his wife. He seems to enjoy the sessions and demonstrates an interest in improving his health. His stress over his disability has been resolved. We will continue to monitor his progress in the program. Patient has completed 19 sessions. He continues to have no psychosocial barriers identified. He continues to see a counselor for depression. He seems to enjoy the sessions and demonstrates an interest in improving his health. His stress over  his disability has been resolved. We will  continue to monitor his progress in the program.   Expected Outcomes -- -- Patient will continue to have no psychosocial barriers identified. -- Patient will continue to have no psychosocial barriers identified.   Interventions -- -- Stress management education;Encouraged to attend Cardiac Rehabilitation for the exercise;Relaxation education Stress management education;Encouraged to attend Cardiac Rehabilitation for the exercise;Relaxation education Stress management education;Encouraged to attend Cardiac Rehabilitation for the exercise;Relaxation education   Continue Psychosocial Services  -- -- -- No Follow up required No Follow up required     Initial Review   Source of Stress Concerns -- -- Financial;Retirement/disability -- --   Comments -- -- Patient has had a diffcult time working with his company regarding his short term disability and he says this has been very stressful for him. -- --            Psychosocial Discharge (Final Psychosocial Re-Evaluation):  Psychosocial Re-Evaluation - 04/16/22 [id number]       Psychosocial Re-Evaluation   Current issues with Current Depression    Comments Patient has completed 19 sessions. He continues to have no psychosocial barriers identified. He continues to see a counselor for depression. He seems to enjoy the sessions and demonstrates an interest in improving his health. His stress over his disability has been resolved. We will continue to monitor his progress in the program.    Expected Outcomes Patient will continue to have no psychosocial barriers identified.    Interventions Stress management education;Encouraged to attend Cardiac Rehabilitation for the exercise;Relaxation education    Continue Psychosocial Services  No Follow up required             Vocational Rehabilitation: Provide vocational rehab assistance to qualifying candidates.   Vocational Rehab Evaluation & Intervention:  Vocational Rehab - 02/20/22 1410       Initial  Vocational Rehab Evaluation & Intervention   Assessment shows need for Vocational Rehabilitation No      Vocational Rehab Re-Evaulation   Comments Patient plans to return to work at his previous job with [employer].             Education: Education Goals: Education classes will be provided on a weekly basis, covering required topics. Participant will state understanding/return demonstration of topics presented.  Learning Barriers/Preferences:  Learning Barriers/Preferences - 02/20/22 1409       Learning Barriers/Preferences   Learning Preferences Written Material;Video;Audio             Education Topics: Hypertension, Hypertension Reduction -Define heart disease and high blood pressure. Discus how high blood pressure affects the body and ways to reduce high blood pressure.   Exercise and Your Heart -Discuss why it is important to exercise, the FITT principles of exercise, normal and abnormal responses to exercise, and how to exercise safely.   Angina -Discuss definition of angina, causes of angina, treatment of angina, and how to decrease risk of having angina.   Cardiac Medications -Review what the following cardiac medications are used for, how they affect the body, and side effects that may occur when taking the medications.  Medications include Aspirin, Beta blockers, calcium channel blockers, ACE Inhibitors, angiotensin receptor blockers, diuretics, digoxin, and antihyperlipidemics.   Congestive Heart Failure -Discuss the definition of CHF, how to live with CHF, the signs and symptoms of CHF, and how keep track of weight and sodium intake.   Heart Disease and Intimacy -Discus the effect sexual activity has on the heart, how changes occur during  intimacy as we age, and safety during sexual activity. Flowsheet Row CARDIAC REHAB PHASE II EXERCISE from 04/16/2022 in [place] [facility]  Date 02/26/22  Educator hb  Instruction Review Code 1- Verbalizes  Understanding       Smoking Cessation / COPD -Discuss different methods to quit smoking, the health benefits of quitting smoking, and the definition of COPD. Flowsheet Row CARDIAC REHAB PHASE II EXERCISE from 04/16/2022 in [place] [facility]  Date 03/05/22  Educator HB  Instruction Review Code 1- Verbalizes Understanding       Nutrition I: Fats -Discuss the types of cholesterol, what cholesterol does to the heart, and how cholesterol levels can be controlled. Flowsheet Row CARDIAC REHAB PHASE II EXERCISE from 04/16/2022 in [place] [facility]  Date 03/17/22  Educator HB  Instruction Review Code 1- Verbalizes Understanding       Nutrition II: Labels -Discuss the different components of food labels and how to read food label Flowsheet Row CARDIAC REHAB PHASE II EXERCISE from 04/16/2022 in [facility]  Date 03/19/22  Educator HB  Instruction Review Code 1- Verbalizes Understanding       Heart Parts/Heart Disease and PAD -Discuss the anatomy of the heart, the pathway of blood circulation through the heart, and these are affected by heart disease.   Stress I: Signs and Symptoms -Discuss the causes of stress, how stress may lead to anxiety and depression, and ways to limit stress. Flowsheet Row CARDIAC REHAB PHASE II EXERCISE from 04/16/2022 in [place] [facility]  Date 04/09/22  Educator HB  Instruction Review Code 1- Verbalizes Understanding       Stress II: Relaxation -Discuss different types of relaxation techniques to limit stress. Flowsheet Row CARDIAC REHAB PHASE II EXERCISE from 04/16/2022 in [place] [facility]IAC [facility]  Date 04/16/22  Educator DF  Instruction Review Code 2- Demonstrated Understanding       Warning Signs of Stroke / TIA -Discuss definition of a stroke, what the signs and symptoms are of a stroke, and how to identify when someone is having stroke.   Knowledge  Questionnaire Score:  Knowledge Questionnaire Score - 02/20/22 1410       Knowledge Questionnaire Score   Pre Score 21/24             Core Components/Risk Factors/Patient Goals at Admission:  Personal Goals and Risk Factors at Admission - 02/20/22 1411       Core Components/Risk Factors/Patient Goals on Admission    Weight Management Weight Maintenance    Improve shortness of breath with ADL's Yes    Intervention Provide education, individualized exercise plan and daily activity instruction to help decrease symptoms of SOB with activities of daily living.    Expected Outcomes Short Term: Improve cardiorespiratory fitness to achieve a reduction of symptoms when performing ADLs;Long Term: Be able to perform more ADLs without symptoms or delay the onset of symptoms    Diabetes Yes    Intervention Provide education about signs/symptoms and action to take for hypo/hyperglycemia.;Provide education about proper nutrition, including hydration, and aerobic/resistive exercise prescription along with prescribed medications to achieve blood glucose in normal ranges: Fasting glucose 65-99 mg/dL    Expected Outcomes Short Term: Participant verbalizes understanding of the signs/symptoms and immediate care of hyper/hypoglycemia, proper foot care and importance of medication, aerobic/resistive exercise and nutrition plan for blood glucose control.;Long Term: Attainment of HbA1C < 7%.    Hypertension Yes    Intervention Provide education on lifestyle modifcations including  regular physical activity/exercise, weight management, moderate sodium restriction and increased consumption of fresh fruit, vegetables, and low fat dairy, alcohol moderation, and smoking cessation.;Monitor prescription use compliance.    Expected Outcomes Short Term: Continued assessment and intervention until BP is < 140/72mm HG in hypertensive participants. < 130/68mm HG in hypertensive participants with diabetes, heart failure or  chronic kidney disease.;Long Term: Maintenance of blood pressure at goal levels.    Stress Yes    Intervention Refer participants experiencing significant psychosocial distress to appropriate mental health specialists for further evaluation and treatment. When possible, include family members and significant others in education/counseling sessions.    Expected Outcomes Short Term: Participant demonstrates changes in health-related behavior, relaxation and other stress management skills, ability to obtain effective social support, and compliance with psychotropic medications if prescribed.    Personal Goal Other Yes    Personal Goal Patient wants to decrease his SOB with exertion; improve his muscle strength; and get back to running again.    Intervention Patient will attend CR 3 days/week with exercise and education.    Expected Outcomes Patient will participate in the program until he returns to work meeting both personal and program goals.             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 He has multiple risk factors for CAD and is participating in the program for risk modification. He plans to start the program Monday 11/6. He DM is managed with Metformin. His personal goals for the program are to decrease his SOB with exertion; improve his muscle strenght and get back to running. We will continue to monitor his progress as he works towards meeting these goals.  Patient has completed 8 sessions. His current weight is 251.2 lbs up 3.2 lbs from his intial weight. He is doing well in the program with consistent attendance and progressions. His DM is managed with Metformin. His blood pressures have been above goal. He says he ran out of Metoprolol but plans to get it refilled. We encouaged hiem to do this ASAP. His personal goals for the program continue to be to decrease his SOB with exertion; improve his muscle strenght and get back to running. We will continue to monitor his progress as he works towards meeting these goals. Patient has completed 19 sessions. His current weight is 253.7 lbs up 2.5 lbs since last 30 day review. He continues to do well in the program with consistent attendance and progressions. His DM is managed with Metformin. His blood pressures have been labile but is improving. He has a Telemedicine visit with Dr. Cliffton Asters 1/4 about his returning to work. If he is released to return, he will not be able to continue the program. His personal goals for the program continue to be to decrease his SOB with exertion; improve his muscle strenght and get back to running. We will continue to monitor his progress as he works towards meeting these goals.     Expected Outcomes Patient will complete the program meeting both personal and program goals. Patient will complete the program meeting both personal and program  goals. Patient will complete the program meeting both personal and program goals.              Core Components/Risk Factors/Patient Goals at Discharge (Final Review):   Goals and Risk Factor Review - 04/16/22 0842       Core Components/Risk Factors/Patient Goals Review   Personal Goals Review Diabetes;Improve shortness of breath with ADL's;Hypertension;Other;Stress;Weight Management/Obesity    Review Patient has completed 19 sessions. His current weight is 253.7 lbs up 2.5 lbs since last 30 day review. He continues to do well in the program  with consistent attendance and progressions. His DM is managed with Metformin. His blood pressures have been labile but is improving. He has a Telemedicine visit with Dr. Cliffton Asters 1/4 about his returning to work. If he is released to return, he will not be able to continue the program. His personal goals for the program continue to be to decrease his SOB with exertion; improve his muscle strenght and get back to running. We will continue to monitor his progress as he works towards meeting these goals.    Expected Outcomes Patient will complete the program meeting both personal and program goals.             ITP Comments:  ITP Comments     Row Name 03/05/22 1010           ITP Comments Pt's HR and BP elevated today. He states that he is out of metoprolol, and that the pharmacy stated that he is out of refills. Message sent to referring provider, Dr. Cliffton Asters.                Comments: ITP REVIEW Pt is making expected progress toward Cardiac Rehab goals after completing 20 sessions. Recommend continued exercise, life style modification, education, and increased stamina and strength.

## 2022-04-23 NOTE — Progress Notes (Signed)
Daily Session Note  Patient Details  Name: Jonathan Riley MRN: [id number] Date of Birth: October 29, 1960 Referring Provider:   Flowsheet Row CARDIAC REHAB PHASE II ORIENTATION from 02/20/2022 in [place]  Referring Provider Dr. Kipp Brood       Encounter Date: 04/23/2022  Check In:  Session Check In - 04/23/22 0930       Check-In   Supervising physician immediately available to respond to emergencies [facility] MD immediately available    Physician(s) Dr. Domenic Polite    Location [facility]    Staff Present Hoy Register MHA, MS, ACSM-CEP;[employer] BSN, RN;Heather Mel Al[place], [place], Exercise Physiologist    Virtual Visit No    Medication changes reported     No    Fall or balance concerns reported    No    Tobacco Cessation No Change    Warm-up and Cool-down Performed as group-led instruction    Resistance Training Performed Yes    VAD Patient? No    PAD/SET Patient? No      Pain Assessment   Currently in Pain? No/denies    Pain Score 0-No pain    Multiple Pain Sites No             Capillary Blood Glucose: No results found for this or any previous visit (from the past 24 hour(s)).    Social History   Tobacco Use  Smoking Status Former   Types: Cigarettes  Smokeless Tobacco Never  Tobacco Comments   "quit about 12 years ago" 02/26/2016    Goals Met:  Independence with exercise equipment Exercise tolerated well No report of concerns or symptoms today Strength training completed today  Goals Unmet:  Not Applicable  Comments: checkout tiime is 1030   Dr. Carlyle Dolly is Medical Director for [facility]

## 2022-04-25 ENCOUNTER — Ambulatory Visit (INDEPENDENT_AMBULATORY_CARE_PROVIDER_SITE_OTHER): Payer: BC Managed Care – PPO | Admitting: Thoracic Surgery (Cardiothoracic Vascular Surgery)

## 2022-04-25 ENCOUNTER — Encounter (HOSPITAL_COMMUNITY)
Admission: RE | Admit: 2022-04-25 | Discharge: 2022-04-25 | Disposition: A | Discharge status: Home / Self Care | Payer: BC Managed Care – PPO | Source: Ambulatory Visit | Attending: Thoracic Surgery (Cardiothoracic Vascular Surgery) | Admitting: Thoracic Surgery (Cardiothoracic Vascular Surgery)

## 2022-04-25 DIAGNOSIS — Z951 Presence of aortocoronary bypass graft: Secondary | ICD-10-CM

## 2022-04-25 NOTE — Progress Notes (Signed)
Daily Session Note  Patient Details  Name: Jonathan Riley MRN: [id number] Date of Birth: [birth year] Referring Provider:   Flowsheet Row CARDIAC REHAB PHASE II ORIENTATION from 02/20/2022 in [place]  Referring Provider Dr. Kipp Brood       Encounter Date: 04/25/2022  Check In:  Session Check In - 04/25/22 0930       Check-In   Supervising physician immediately available to respond to emergencies [facility] MD immediately available    Physician(s) Dr. Domenic Polite    Location [facility]    Staff Present Hoy Register MHA, MS, ACSM-CEP;Leana Roe, BS, Exercise Physiologist;Levonia Wolfley Wynetta Emery, RN, BSN    Virtual Visit No    Medication changes reported     No    Fall or balance concerns reported    No    Tobacco Cessation No Change    Warm-up and Cool-down Performed as group-led instruction    Resistance Training Performed Yes    VAD Patient? No    PAD/SET Patient? No      Pain Assessment   Currently in Pain? No/denies    Pain Score 0-No pain    Multiple Pain Sites No             Capillary Blood Glucose: No results found for this or any previous visit (from the past 24 hour(s)).    Social History   Tobacco Use  Smoking Status Former   Types: Cigarettes  Smokeless Tobacco Never  Tobacco Comments   "quit about 12 years ago" 02/26/2016    Goals Met:  Independence with exercise equipment Exercise tolerated well No report of concerns or symptoms today Strength training completed today  Goals Unmet:  Not Applicable  Comments: Check out 1030.   Dr. Carlyle Dolly is Medical Director for [facility]

## 2022-04-25 NOTE — Progress Notes (Signed)
     [address]       ,[place] [address]             [phone]       Patient: Home Provider: Office Consent for Telemedicine visit obtained.  Today's visit was completed via a real-time telehealth (see specific modality noted below). The patient/authorized person provided oral consent at the time of the visit to engage in a telemedicine encounter with the present provider at [facility]. The patient/authorized person was informed of the potential benefits, limitations, and risks of telemedicine. The patient/authorized person expressed understanding that the laws that protect confidentiality also apply to telemedicine. The patient/authorized person acknowledged understanding that telemedicine does not provide emergency services and that he or she would need to call 911 or proceed to the nearest hospital for help if such a need arose.   Total time spent in the clinical discussion 10 minutes.  Telehealth Modality: Phone visit (audio only)  I had a telephone visit with Jonathan Riley.  Overall he is doing well.  He has started cardiac rehab but still has some pain when he sneezes.  He is scheduled to complete cardiac rehab on January 24 and would like to return to work on January 25.  Draylen Lobue Bary Leriche

## 2022-04-28 ENCOUNTER — Ambulatory Visit (INDEPENDENT_AMBULATORY_CARE_PROVIDER_SITE_OTHER): Payer: BC Managed Care – PPO | Admitting: Psychology

## 2022-04-28 ENCOUNTER — Encounter (HOSPITAL_COMMUNITY)
Admission: RE | Admit: 2022-04-28 | Discharge: 2022-04-28 | Disposition: A | Discharge status: Home / Self Care | Payer: BC Managed Care – PPO | Source: Ambulatory Visit | Attending: Thoracic Surgery (Cardiothoracic Vascular Surgery) | Admitting: Thoracic Surgery (Cardiothoracic Vascular Surgery)

## 2022-04-28 DIAGNOSIS — F3289 Other specified depressive episodes: Secondary | ICD-10-CM

## 2022-04-28 DIAGNOSIS — Z951 Presence of aortocoronary bypass graft: Secondary | ICD-10-CM | POA: Diagnosis not present

## 2022-04-28 NOTE — Progress Notes (Signed)
[facility] Counselor/Therapist Progress Note  Patient ID: BERTIL BRICKEY, MRN: [id number]   Date: 04/28/22  Time Spent: 3:38  pm - 4:35 pm : 57 Minutes  Treatment Type: Individual Therapy.  Reported Symptoms: depression and anxiety.   Mental Status Exam: Appearance:  Fairly Groomed     Behavior: Appropriate  Motor: Normal  Speech/Language:  Clear and Coherent  Affect: Congruent  Mood: normal  Thought process: normal  Thought content:   WNL  Sensory/Perceptual disturbances:   WNL  Orientation: oriented to person, place, time/date, and situation  Attention: Good  Concentration: Good  Memory: WNL  Fund of knowledge:  Good  Insight:   Good  Judgment:  Good  Impulse Control: Good   Risk Assessment: Danger to Self:  No Self-injurious Behavior: No Danger to Others: No Duty to Warn:no Physical Aggression / Violence:No  Access to Firearms a concern: No  Gang Involvement:No   Subjective:   Gerda Diss Benincasa participated from home, via video and consented to treatment. Therapist participated from home office. We met online due to [place] pandemic. Braddock reviewed the events of the past week. We reviewed his sleep log and he noted increased caffeine intake and we discussed the possible effect of this on his sleep, more frequent bathroom visits, and difficulty managing middle insomnia. He noted feelings of frustration regarding his in-laws taking the majority of the leftovers without asking. Jamaine completed the mood disorder questionnaire with no affirmatives. However, he completed a second iteration, upon recall, while taking Zoloft and endorsed numerous symptoms including feeling hyper, increased self-confidence, more talkative, racing thoughts, increased energy, more outgoing and social, increased interest in sex, risky/foolish behavior and noted this being a "serious problem". He previously noted his daughter having difficult time managing her frustrations and noted her  being quick to anger. He noted his avoidance to give feedback due to people's possible reaction. We worked on exploring this during the session and worked on identifying ways to be mindful of mood, identify explicitly how he feels (feeling wheels), and  ways to provide information (I messages). Therapist modeled tools and skills during the session and encouraged Nissan to work on these skills between sessions. A follow-up was scheduled for continued treatment. Therapist validated Jeneen Rinks feelings and experience during the session.   Interventions: CBT and interpersonal.  Diagnosis:   Other depression  Psychiatric Treatment: No , history of Zoloft/Xanax.   Treatment Plan:  Client Abilities/Strengths Zyshawn is self-aware and motivated for change.   Support System: Family and friends.   Client Treatment Preferences OPT  Client Statement of Needs Wofford would like to manage his frustration, how other's perceive his emotions, decrease frustration tolerance, boundaries with self and others, proactively managing mood, increase self-awareness, and process past events and behaviors.   Treatment Level Weekly  Symptoms  Depression: loss of interest, feeling down, trouble sleeping, lethargy, fluctuating diet, feeling bad about self, difficulty concentrating.    (Status: maintained) Anxiety:  irritability, feeling anxious, difficulty managing worry, worrying about different things, trouble relaxing, restlessness, feeling afraid something awful might happen.  (Status: maintained)  Goals:   Jeneen Rinks experiences symptoms of depression and anxiety.    Target Date: 03/28/23 Frequency: Weekly  Progress: 0 Modality: individual    Therapist will provide referrals for additional resources as appropriate.  Therapist will provide psycho-education regarding Trayvond's diagnosis and corresponding treatment approaches and interventions. Licensed Clinical Social Worker, Fernando Salinas, LCSW will support the  patient's ability to achieve the goals identified. will employ CBT, BA,  Problem-solving, Solution Focused, Mindfulness,  coping skills, & other evidenced-based practices will be used to promote progress towards healthy functioning to help manage decrease symptoms associated with his diagnosis.   Reduce overall level, frequency, and intensity of the feelings of depression, anxiety and panic evidenced by decreased  from 6 to 7 days/week to 0 to 1 days/week per client report for at least 3 consecutive months. Verbally express understanding of the relationship between feelings of depression, anxiety and their impact on thinking patterns and behaviors. Verbalize an understanding of the role that distorted thinking plays in creating fears, excessive worry, and ruminations.   Fayrene Fearing participated in the creation of the treatment plan)   Delight Ovens, LCSW

## 2022-04-28 NOTE — Progress Notes (Signed)
Daily Session Note  Patient Details  Name: Jonathan Riley MRN: [id number] Date of Birth: 11-07-1960 Referring Provider:   Flowsheet Row CARDIAC REHAB PHASE II ORIENTATION from 02/20/2022 in [place]  Referring Provider Dr. Kipp Brood       Encounter Date: 04/28/2022  Check In:  Session Check In - 04/28/22 0941       Check-In   Supervising physician immediately available to respond to emergencies [facility] MD immediately available    Physician(s) Dr Dellia Cloud    Location [facility]    Staff Present Hoy Register MHA, MS, ACSM-CEP;Leana Roe, BS, Exercise Physiologist;Shaquinta Peruski Hassell Done, RN, BSN    Virtual Visit No    Medication changes reported     No    Fall or balance concerns reported    No    Tobacco Cessation No Change    Warm-up and Cool-down Performed as group-led instruction    Resistance Training Performed Yes    VAD Patient? No    PAD/SET Patient? No      Pain Assessment   Currently in Pain? No/denies    Pain Score 0-No pain    Multiple Pain Sites No             Capillary Blood Glucose: No results found for this or any previous visit (from the past 24 hour(s)).    Social History   Tobacco Use  Smoking Status Former   Types: Cigarettes  Smokeless Tobacco Never  Tobacco Comments   "quit about 12 years ago" 02/26/2016    Goals Met:  Independence with exercise equipment Exercise tolerated well No report of concerns or symptoms today Strength training completed today  Goals Unmet:  Not Applicable  Comments: Checkout at 1030.   Dr. Carlyle Dolly is Medical Director for [facility]

## 2022-04-30 ENCOUNTER — Encounter (HOSPITAL_COMMUNITY)
Admission: RE | Admit: 2022-04-30 | Discharge: 2022-04-30 | Disposition: A | Discharge status: Home / Self Care | Payer: BC Managed Care – PPO | Source: Ambulatory Visit | Attending: Thoracic Surgery (Cardiothoracic Vascular Surgery) | Admitting: Thoracic Surgery (Cardiothoracic Vascular Surgery)

## 2022-04-30 DIAGNOSIS — Z951 Presence of aortocoronary bypass graft: Secondary | ICD-10-CM | POA: Diagnosis not present

## 2022-04-30 NOTE — Progress Notes (Signed)
Daily Session Note  Patient Details  Name: Jonathan Riley MRN: [id number] Date of Birth: 09/05/1960 Referring Provider:   Flowsheet Row CARDIAC REHAB PHASE II ORIENTATION from 02/20/2022 in [place]  Referring Provider Dr. Kipp Brood       Encounter Date: 04/30/2022  Check In:  Session Check In - 04/30/22 0930       Check-In   Supervising physician immediately available to respond to emergencies [facility] MD immediately available    Physician(s) Dr Dellia Cloud    Location [facility]    Staff Present Hoy Register MHA, MS, ACSM-CEP;[employer] BSN, RN;Heather Mel Almond, [place], Exercise Physiologist    Virtual Visit No    Medication changes reported     No    Fall or balance concerns reported    No    Tobacco Cessation No Change    Warm-up and Cool-down Performed as group-led instruction    Resistance Training Performed Yes    VAD Patient? No    PAD/SET Patient? No      Pain Assessment   Currently in Pain? No/denies    Pain Score 0-No pain    Multiple Pain Sites No             Capillary Blood Glucose: No results found for this or any previous visit (from the past 24 hour(s)).    Social History   Tobacco Use  Smoking Status Former   Types: Cigarettes  Smokeless Tobacco Never  Tobacco Comments   "quit about 12 years ago" 02/26/2016    Goals Met:  Independence with exercise equipment Exercise tolerated well No report of concerns or symptoms today Strength training completed today  Goals Unmet:  Not Applicable  Comments: checkout time is 1030   Dr. Carlyle Dolly is Medical Director for [facility]

## 2022-05-02 ENCOUNTER — Encounter (HOSPITAL_COMMUNITY)
Admission: RE | Admit: 2022-05-02 | Discharge: 2022-05-02 | Disposition: A | Discharge status: Home / Self Care | Payer: BC Managed Care – PPO | Source: Ambulatory Visit | Attending: Thoracic Surgery (Cardiothoracic Vascular Surgery) | Admitting: Thoracic Surgery (Cardiothoracic Vascular Surgery)

## 2022-05-02 DIAGNOSIS — Z951 Presence of aortocoronary bypass graft: Secondary | ICD-10-CM | POA: Diagnosis not present

## 2022-05-02 NOTE — Progress Notes (Signed)
Daily Session Note  Patient Details  Name: Jonathan Riley MRN: [id number] Date of Birth: [birth year] Referring Provider:   Flowsheet Row CARDIAC REHAB PHASE II ORIENTATION from 02/20/2022 in [place]  Referring Provider Dr. Kipp Brood       Encounter Date: 05/02/2022  Check In:  Session Check In - 05/02/22 0930       Check-In   Supervising physician immediately available to respond to emergencies [facility] MD immediately available    Physician(s) Dr Dellia Cloud    Location [facility]    Staff Present Hoy Register MHA, MS, ACSM-CEP;Madelyn Flavors, RN, BSN;Heather Mel Almond, BS, Exercise Physiologist    Virtual Visit No    Medication changes reported     No    Fall or balance concerns reported    No    Tobacco Cessation No Change    Warm-up and Cool-down Performed as group-led instruction    Resistance Training Performed Yes    VAD Patient? No    PAD/SET Patient? No      Pain Assessment   Currently in Pain? No/denies    Pain Score 0-No pain    Multiple Pain Sites No             Capillary Blood Glucose: No results found for this or any previous visit (from the past 24 hour(s)).    Social History   Tobacco Use  Smoking Status Former   Types: Cigarettes  Smokeless Tobacco Never  Tobacco Comments   "quit about 12 years ago" 02/26/2016    Goals Met:  Independence with exercise equipment Exercise tolerated well No report of concerns or symptoms today Strength training completed today  Goals Unmet:  Not Applicable  Comments: checkout time is 1030   Dr. Carlyle Dolly is Medical Director for [facility]

## 2022-05-05 ENCOUNTER — Encounter (HOSPITAL_COMMUNITY): Payer: BC Managed Care – PPO

## 2022-05-07 ENCOUNTER — Encounter (HOSPITAL_COMMUNITY): Payer: BC Managed Care – PPO

## 2022-05-09 ENCOUNTER — Encounter (HOSPITAL_COMMUNITY): Payer: BC Managed Care – PPO

## 2022-05-12 ENCOUNTER — Encounter (HOSPITAL_BASED_OUTPATIENT_CLINIC_OR_DEPARTMENT_OTHER): Payer: Self-pay | Admitting: Cardiology

## 2022-05-12 ENCOUNTER — Encounter (HOSPITAL_COMMUNITY)
Admission: RE | Admit: 2022-05-12 | Discharge: 2022-05-12 | Disposition: A | Discharge status: Home / Self Care | Payer: BC Managed Care – PPO | Source: Ambulatory Visit | Attending: Thoracic Surgery (Cardiothoracic Vascular Surgery) | Admitting: Thoracic Surgery (Cardiothoracic Vascular Surgery)

## 2022-05-12 DIAGNOSIS — Z951 Presence of aortocoronary bypass graft: Secondary | ICD-10-CM | POA: Diagnosis not present

## 2022-05-12 NOTE — Progress Notes (Signed)
Daily Session Note  Patient Details  Name: Jonathan Riley MRN: [id number] Date of Birth: 07/30/1960 Referring Provider:   Flowsheet Row CARDIAC REHAB PHASE II ORIENTATION from 02/20/2022 in [place]  Referring Provider Dr. Kipp Brood       Encounter Date: 05/12/2022  Check In:  Session Check In - 05/12/22 0936       Check-In   Supervising physician immediately available to respond to emergencies [facility] MD immediately available    Physician(s) Dr Harl Bowie    Location [facility]    Staff Present Hoy Register MHA, MS, ACSM-CEP;Madelyn Flavors, RN, BSN;Heather Mel Almond, BS, Exercise Physiologist    Virtual Visit No    Medication changes reported     No    Fall or balance concerns reported    No    Tobacco Cessation No Change    Warm-up and Cool-down Performed as group-led instruction    Resistance Training Performed Yes    VAD Patient? No    PAD/SET Patient? No      Pain Assessment   Currently in Pain? No/denies    Pain Score 0-No pain    Multiple Pain Sites No             Capillary Blood Glucose: No results found for this or any previous visit (from the past 24 hour(s)).    Social History   Tobacco Use  Smoking Status Former   Types: Cigarettes  Smokeless Tobacco Never  Tobacco Comments   "quit about 12 years ago" 02/26/2016    Goals Met:  Independence with exercise equipment Exercise tolerated well No report of concerns or symptoms today Strength training completed today  Goals Unmet:  Not Applicable  Comments: Checkout at 1030.   Dr. Carlyle Dolly is Medical Director for [facility]

## 2022-05-13 ENCOUNTER — Ambulatory Visit (INDEPENDENT_AMBULATORY_CARE_PROVIDER_SITE_OTHER): Payer: BC Managed Care – PPO | Admitting: Psychology

## 2022-05-13 DIAGNOSIS — F3289 Other specified depressive episodes: Secondary | ICD-10-CM

## 2022-05-13 NOTE — Progress Notes (Signed)
Jonathan Riley  Patient ID: Jonathan Riley, MRN: [id number]   Date: 05/13/22  Time Spent: 12:01  pm - 12:54 pm : 53 Minutes  Treatment Type: Individual Therapy.  Reported Symptoms: depression and anxiety.   Mental Status Exam: Appearance:  Neat and Well Groomed     Behavior: Appropriate  Motor: Restlestness  Speech/Language:  Clear and Coherent  Affect: Congruent  Mood: anxious  Thought process: normal  Thought content:   WNL  Sensory/Perceptual disturbances:   WNL  Orientation: oriented to person, place, time/date, and situation  Attention: Good  Concentration: Good  Memory: WNL  Fund of knowledge:  Good  Insight:   Good  Judgment:  Good  Impulse Control: Good   Risk Assessment: Danger to Self:  No Self-injurious Behavior: No Danger to Others: No Duty to Warn:no Physical Aggression / Violence:No  Access to Firearms a concern: No  Gang Involvement:No   Subjective:   Jonathan Riley participated from office, with therapist and consented to treatment.  Jonathan Riley reviewed the events of the past week. He noted making progress during his cardiac rehab and noticing the change. 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  He noted feeling "sad" regarding having to go to work Thursday. He noted his work not being prepared for his variable and this being an issue in the past. We worked on feeling identification during the session. He noted feelings of powerless despite giving feedback to his supervisors regarding of varying issues. We reviewed distress tolerance and radical acceptance  and provided modeling during the session. We will review this going forward during our follow-up. Therapist provided psycho-education regarding the importance of self-awareness and proactive mood management. Therapist encouraged Jonathan Riley to employ these skills going forward. A follow-up was scheduled for  continued treatment. Therapist validated Jonathan Riley feelings and experience during the session.   Interventions: DBT  Diagnosis:   Other depression  Psychiatric Treatment: No , history of Zoloft/Xanax.   Treatment Plan:  Client Abilities/Strengths Jonathan Riley is self-aware and motivated for change.   Support System: Family and friends.   Client Treatment Preferences OPT  Client Statement of Needs Jonathan Riley would like to manage his frustration, how other's perceive his emotions, decrease frustration tolerance, boundaries with self and others, proactively managing mood, increase self-awareness, and process past events and behaviors.   Treatment Level Weekly  Symptoms  Depression: loss of interest, feeling down, trouble sleeping, lethargy, fluctuating diet, feeling bad about self, difficulty concentrating.    (Status: maintained) Anxiety:  irritability, feeling anxious, difficulty managing worry, worrying about different things, trouble relaxing, restlessness, feeling afraid something awful might happen.  (Status: maintained)  Goals:   Jonathan Riley experiences symptoms of depression and anxiety.    Target Date: 03/28/23 Frequency: Weekly  Progress: 0 Modality: individual    Therapist will provide referrals for additional resources as appropriate.  Therapist will provide psycho-education regarding Jonathan Riley's diagnosis and corresponding treatment approaches and interventions. Licensed Clinical Social Worker, [place], LCSW will support the patient's ability to achieve the goals identified. will employ CBT, BA, Problem-solving, Solution Focused, Mindfulness,  coping skills, & other evidenced-based practices will be used to promote progress towards healthy functioning to help manage decrease symptoms associated with his diagnosis.   Reduce overall level, frequency, and intensity of the feelings of depression, anxiety and panic evidenced by decreased  from 6 to 7 days/week to 0 to 1 days/week per  client report for  at least 3 consecutive months. Verbally express understanding of the relationship between feelings of depression, anxiety and their impact on thinking patterns and behaviors. Verbalize an understanding of the role that distorted thinking plays in creating fears, excessive worry, and ruminations.   Jonathan Riley participated in the creation of the treatment plan)   Buena Irish, LCSW

## 2022-05-14 ENCOUNTER — Encounter (HOSPITAL_COMMUNITY)
Admission: RE | Admit: 2022-05-14 | Discharge: 2022-05-14 | Disposition: A | Discharge status: Home / Self Care | Payer: BC Managed Care – PPO | Source: Ambulatory Visit | Attending: Thoracic Surgery (Cardiothoracic Vascular Surgery) | Admitting: Thoracic Surgery (Cardiothoracic Vascular Surgery)

## 2022-05-14 VITALS — Ht 70.0 in | Wt 236.6 lb

## 2022-05-14 DIAGNOSIS — Z951 Presence of aortocoronary bypass graft: Secondary | ICD-10-CM

## 2022-05-14 NOTE — Progress Notes (Signed)
Daily Session Note  Patient Details  Name: Jonathan Riley MRN: [id number] Date of Birth: March 17, 1961 Referring Provider:   Flowsheet Row CARDIAC REHAB PHASE II ORIENTATION from 02/20/2022 in [place]  Referring Provider Dr. Kipp Brood       Encounter Date: 05/14/2022  Check In:  Session Check In - 05/14/22 0930       Check-In   Supervising physician immediately available to respond to emergencies [facility] MD immediately available    Physician(s) Dr. Domenic Polite    Location [facility]    Staff Present Hoy Register MHA, MS, ACSM-CEP;[employer] BSN, RN;Heather Mel Almond, [place], Exercise Physiologist    Virtual Visit No    Medication changes reported     No    Fall or balance concerns reported    No    Tobacco Cessation No Change    Warm-up and Cool-down Performed as group-led instruction    Resistance Training Performed Yes    VAD Patient? No    PAD/SET Patient? No      Pain Assessment   Currently in Pain? No/denies    Pain Score 0-No pain    Multiple Pain Sites No             Capillary Blood Glucose: No results found for this or any previous visit (from the past 24 hour(s)).    Social History   Tobacco Use  Smoking Status Former   Types: Cigarettes  Smokeless Tobacco Never  Tobacco Comments   "quit about 12 years ago" 02/26/2016    Goals Met:  Independence with exercise equipment Exercise tolerated well No report of concerns or symptoms today Strength training completed today  Goals Unmet:  Not Applicable  Comments: checkout time is 1030   Dr. Carlyle Dolly is Medical Director for [facility]

## 2022-05-20 NOTE — Progress Notes (Signed)
Discharge Progress Report  Patient Details  Name: Jonathan Riley MRN: [id number] Date of Birth: [birth year] Referring Provider:   Flowsheet Row CARDIAC REHAB PHASE II ORIENTATION from 02/20/2022 in [place]  Referring Provider Dr. Kipp Brood        Number of Visits: 27  Reason for Discharge:  Patient reached a stable level of exercise. Patient independent in their exercise. Patient has met program and personal goals.  Smoking History:  Social History   Tobacco Use  Smoking Status Former   Types: Cigarettes  Smokeless Tobacco Never  Tobacco Comments   "quit about 12 years ago" 02/26/2016    Diagnosis:  S/P CABG x 5  ADL [facility]SD:   Initial Exercise Prescription:  Initial Exercise Prescription - 02/20/22 1400       Date of Initial Exercise RX and Referring Provider   Date 02/20/22    Referring Provider Dr. Kipp Brood    Expected Discharge Date 05/14/22      Treadmill   MPH 2.5    Grade 0    Minutes 17      NuStep   Level 1    SPM 60    Minutes 22      Prescription Details   Frequency (times per week) 3    Duration Progress to 30 minutes of continuous aerobic without signs/symptoms of physical distress      Intensity   THRR 40-80% of Max Heartrate 64-127    Ratings of Perceived Exertion 11-13      Resistance Training   Training Prescription Yes    Weight 4    Reps 10-15             Discharge Exercise Prescription (Final Exercise Prescription Changes):  Exercise Prescription Changes - 05/02/22 1200       Response to Exercise   Blood Pressure (Admit) 144/80    Blood Pressure (Exercise) 160/70    Blood Pressure (Exit) 110/60    Heart Rate (Admit) 79 bpm    Heart Rate (Exercise) 96 bpm    Heart Rate (Exit) 83 bpm    Rating of Perceived Exertion (Exercise) 10    Duration Continue with 30 min of aerobic exercise without signs/symptoms of physical distress.    Intensity THRR unchanged      Progression   Progression  Continue to progress workloads to maintain intensity without signs/symptoms of physical distress.      Resistance Training   Training Prescription Yes    Weight 10    Reps 10-15    Time 10 Minutes      Treadmill   MPH 3.2    Grade 0    Minutes 22    METs 3.45             Functional Capacity:  6 Minute Walk     Row Name 02/20/22 1408 05/14/22 1420       6 Minute Walk   Phase Initial Discharge    Distance 1950 feet 2200 feet    Walk Time 6 minutes 6 minutes    # of Rest Breaks 0 0    MPH 3.69 4.16    METS 4.69 4.89    RPE 11 12    VO2 Peak 16.43 17.14    Symptoms No No    Resting HR 73 bpm 60 bpm    Resting BP 150/70 140/60    Resting Oxygen Saturation  94 % 95 %    Exercise Oxygen Saturation  during 6 min walk  97 % 95 %    Max Ex. HR 120 bpm 95 bpm    Max Ex. BP 180/66 180/70    2 Minute Post BP 160/62 160/70             Psychological, QOL, Others - Outcomes: PHQ 2/9:    02/20/2022    2:05 PM 02/12/2017    9:00 AM  Depression screen PHQ 2/9  Decreased Interest 2 0  Down, Depressed, Hopeless 1 0  PHQ - 2 Score 3 0  Altered sleeping 3   Tired, decreased energy 2   Change in appetite 3   Feeling bad or failure about yourself  1   Trouble concentrating 1   Moving slowly or fidgety/restless 0   Suicidal thoughts 0   PHQ-9 Score 13   Difficult doing work/chores Not difficult at all     Quality of Life:  Quality of Life - 05/14/22 1422       Quality of Life   Select Quality of Life      Quality of Life Scores   Health/Function Pre 15.4 %    Health/Function Post 24 %    Health/Function % Change 55.84 %    Socioeconomic Pre 16.5 %    Socioeconomic Post 22.29 %    Socioeconomic % Change  35.09 %    Psych/Spiritual Pre 14.71 %    Psych/Spiritual Post 21.43 %    Psych/Spiritual % Change 45.68 %    Family Pre 17 %    Family Post 27.6 %    Family % Change 62.35 %    GLOBAL Pre 15.74 %    GLOBAL Post 23.65 %    GLOBAL % Change 50.25 %              Personal Goals: Goals established at orientation with interventions provided to work toward goal.  Personal Goals and Risk Factors at Admission - 02/20/22 1411       Core Components/Risk Factors/Patient Goals on Admission    Weight Management Weight Maintenance    Improve shortness of breath with ADL's Yes    Intervention Provide education, individualized exercise plan and daily activity instruction to help decrease symptoms of SOB with activities of daily living.    Expected Outcomes Short Term: Improve cardiorespiratory fitness to achieve a reduction of symptoms when performing ADLs;Long Term: Be able to perform more ADLs without symptoms or delay the onset of symptoms    Diabetes Yes    Intervention Provide education about signs/symptoms and action to take for hypo/hyperglycemia.;Provide education about proper nutrition, including hydration, and aerobic/resistive exercise prescription along with prescribed medications to achieve blood glucose in normal ranges: Fasting glucose 65-99 mg/dL    Expected Outcomes Short Term: Participant verbalizes understanding of the signs/symptoms and immediate care of hyper/hypoglycemia, proper foot care and importance of medication, aerobic/resistive exercise and nutrition plan for blood glucose control.;Long Term: Attainment of HbA1C < 7%.    Hypertension Yes    Intervention Provide education on lifestyle modifcations including regular physical activity/exercise, weight management, moderate sodium restriction and increased consumption of fresh fruit, vegetables, and low fat dairy, alcohol moderation, and smoking cessation.;Monitor prescription use compliance.    Expected Outcomes Short Term: Continued assessment and intervention until BP is < 140/65mm HG in hypertensive participants. < 130/24mm HG in hypertensive participants with diabetes, heart failure or chronic kidney disease.;Long Term: Maintenance of blood pressure at goal levels.    Stress Yes     Intervention Refer participants experiencing significant psychosocial  distress to appropriate mental health specialists for further evaluation and treatment. When possible, include family members and significant others in education/counseling sessions.    Expected Outcomes Short Term: Participant demonstrates changes in health-related behavior, relaxation and other stress management skills, ability to obtain effective social support, and compliance with psychotropic medications if prescribed.    Personal Goal Other Yes    Personal Goal Patient wants to decrease his SOB with exertion; improve his muscle strength; and get back to running again.    Intervention Patient will attend CR 3 days/week with exercise and education.    Expected Outcomes Patient will participate in the program until he returns to work meeting both personal and program goals.              Personal Goals Discharge:  Goals and Risk Factor Review     Row Name 02/21/22 1341 03/17/22 0859 04/16/22 0842 05/20/22 0920       Core Components/Risk Factors/Patient Goals Review   Personal Goals Review Diabetes;Improve shortness of breath with ADL's;Hypertension;Other;Stress;Weight Management/Obesity Diabetes;Improve shortness of breath with ADL's;Hypertension;Other;Stress;Weight Management/Obesity Diabetes;Improve shortness of breath with ADL's;Hypertension;Other;Stress;Weight Management/Obesity Diabetes;Improve shortness of breath with ADL's;Hypertension;Other;Stress;Weight Management/Obesity    Review Patient was referred to CR with CABGx5. He has multiple risk factors for CAD and is participating in the program for risk modification. He plans to start the program Monday 11/6. He DM is managed with Metformin. His personal goals for the program are to decrease his SOB with exertion; improve his muscle strenght and get back to running. We will continue to monitor his progress as he works towards meeting these goals. Patient has  completed 8 sessions. His current weight is 251.2 lbs up 3.2 lbs from his intial weight. He is doing well in the program with consistent attendance and progressions. His DM is managed with Metformin. His blood pressures have been above goal. He says he ran out of Metoprolol but plans to get it refilled. We encouaged hiem to do this ASAP. His personal goals for the program continue to be to decrease his SOB with exertion; improve his muscle strenght and get back to running. We will continue to monitor his progress as he works towards meeting these goals. Patient has completed 19 sessions. His current weight is 253.7 lbs up 2.5 lbs since last 30 day review. He continues to do well in the program with consistent attendance and progressions. His DM is managed with Metformin. His blood pressures have been labile but is improving. He has a Telemedicine visit with Dr. Cliffton Asters 1/4 about his returning to work. If he is released to return, he will not be able to continue the program. His personal goals for the program continue to be to decrease his SOB with exertion; improve his muscle strenght and get back to running. We will continue to monitor his progress as he works towards meeting these goals. Pt graduated from CR after 27 sessions. He lost 11.4 lbs while he was in the program by increasing his activity level and by eating better. His MEDFICTs score improved by 31 points. His attendance was consistent and he was able to continuously increase his workloads. His DM is managed by Metformin, and he has not had an updated A1C since he began the program. His blood pressures were inconsistent and above goal often times. He now feels like he has gotten strong enough to return to work, which was one of his goals. He reports that his job is physically strenuous, as he has to carry  a ladder and then climb the ladder. He was glad that Dr. Cliffton Asters wrote him out of work and allowed him to do the full program.    Expected  Outcomes Patient will complete the program meeting both personal and program goals. Patient will complete the program meeting both personal and program goals. Patient will complete the program meeting both personal and program goals. Pt will continue to work towards their goals post discharge.             Exercise Goals and Review:  Exercise Goals     Row Name 02/20/22 1414 02/24/22 1510 03/24/22 1324 04/23/22 0724       Exercise Goals   Increase Physical Activity Yes Yes Yes Yes    Intervention Provide advice, education, support and counseling about physical activity/exercise needs.;Develop an individualized exercise prescription for aerobic and resistive training based on initial evaluation findings, risk stratification, comorbidities and participant's personal goals. Provide advice, education, support and counseling about physical activity/exercise needs.;Develop an individualized exercise prescription for aerobic and resistive training based on initial evaluation findings, risk stratification, comorbidities and participant's personal goals. Provide advice, education, support and counseling about physical activity/exercise needs.;Develop an individualized exercise prescription for aerobic and resistive training based on initial evaluation findings, risk stratification, comorbidities and participant's personal goals. Provide advice, education, support and counseling about physical activity/exercise needs.;Develop an individualized exercise prescription for aerobic and resistive training based on initial evaluation findings, risk stratification, comorbidities and participant's personal goals.    Expected Outcomes Short Term: Attend rehab on a regular basis to increase amount of physical activity.;Long Term: Exercising regularly at least 3-5 days a week.;Long Term: Add in home exercise to make exercise part of routine and to increase amount of physical activity. Short Term: Attend rehab on a regular  basis to increase amount of physical activity.;Long Term: Exercising regularly at least 3-5 days a week.;Long Term: Add in home exercise to make exercise part of routine and to increase amount of physical activity. Short Term: Attend rehab on a regular basis to increase amount of physical activity.;Long Term: Exercising regularly at least 3-5 days a week.;Long Term: Add in home exercise to make exercise part of routine and to increase amount of physical activity. Short Term: Attend rehab on a regular basis to increase amount of physical activity.;Long Term: Exercising regularly at least 3-5 days a week.;Long Term: Add in home exercise to make exercise part of routine and to increase amount of physical activity.    Increase Strength and Stamina Yes Yes Yes Yes    Intervention Provide advice, education, support and counseling about physical activity/exercise needs.;Develop an individualized exercise prescription for aerobic and resistive training based on initial evaluation findings, risk stratification, comorbidities and participant's personal goals. Provide advice, education, support and counseling about physical activity/exercise needs.;Develop an individualized exercise prescription for aerobic and resistive training based on initial evaluation findings, risk stratification, comorbidities and participant's personal goals. Provide advice, education, support and counseling about physical activity/exercise needs.;Develop an individualized exercise prescription for aerobic and resistive training based on initial evaluation findings, risk stratification, comorbidities and participant's personal goals. Provide advice, education, support and counseling about physical activity/exercise needs.;Develop an individualized exercise prescription for aerobic and resistive training based on initial evaluation findings, risk stratification, comorbidities and participant's personal goals.    Expected Outcomes Short Term:  Increase workloads from initial exercise prescription for resistance, speed, and METs.;Short Term: Perform resistance training exercises routinely during rehab and add in resistance training at home;Long Term: Improve cardiorespiratory fitness,  muscular endurance and strength as measured by increased METs and functional capacity (6MWT) Short Term: Increase workloads from initial exercise prescription for resistance, speed, and METs.;Short Term: Perform resistance training exercises routinely during rehab and add in resistance training at home;Long Term: Improve cardiorespiratory fitness, muscular endurance and strength as measured by increased METs and functional capacity (6MWT) Short Term: Increase workloads from initial exercise prescription for resistance, speed, and METs.;Short Term: Perform resistance training exercises routinely during rehab and add in resistance training at home;Long Term: Improve cardiorespiratory fitness, muscular endurance and strength as measured by increased METs and functional capacity (6MWT) Short Term: Increase workloads from initial exercise prescription for resistance, speed, and METs.;Short Term: Perform resistance training exercises routinely during rehab and add in resistance training at home;Long Term: Improve cardiorespiratory fitness, muscular endurance and strength as measured by increased METs and functional capacity (6MWT)    Able to understand and use rate of perceived exertion (RPE) scale Yes Yes Yes Yes    Intervention Provide education and explanation on how to use RPE scale Provide education and explanation on how to use RPE scale Provide education and explanation on how to use RPE scale Provide education and explanation on how to use RPE scale    Expected Outcomes Short Term: Able to use RPE daily in rehab to express subjective intensity level;Long Term:  Able to use RPE to guide intensity level when exercising independently Short Term: Able to use RPE daily in  rehab to express subjective intensity level;Long Term:  Able to use RPE to guide intensity level when exercising independently Short Term: Able to use RPE daily in rehab to express subjective intensity level;Long Term:  Able to use RPE to guide intensity level when exercising independently Short Term: Able to use RPE daily in rehab to express subjective intensity level;Long Term:  Able to use RPE to guide intensity level when exercising independently    Knowledge and understanding of Target Heart Rate Range (THRR) Yes Yes Yes Yes    Intervention Provide education and explanation of THRR including how the numbers were predicted and where they are located for reference Provide education and explanation of THRR including how the numbers were predicted and where they are located for reference Provide education and explanation of THRR including how the numbers were predicted and where they are located for reference Provide education and explanation of THRR including how the numbers were predicted and where they are located for reference    Expected Outcomes Short Term: Able to state/look up THRR;Long Term: Able to use THRR to govern intensity when exercising independently;Short Term: Able to use daily as guideline for intensity in rehab Short Term: Able to state/look up THRR;Long Term: Able to use THRR to govern intensity when exercising independently;Short Term: Able to use daily as guideline for intensity in rehab Short Term: Able to state/look up THRR;Long Term: Able to use THRR to govern intensity when exercising independently;Short Term: Able to use daily as guideline for intensity in rehab Short Term: Able to state/look up THRR;Long Term: Able to use THRR to govern intensity when exercising independently;Short Term: Able to use daily as guideline for intensity in rehab    Able to check pulse independently Yes Yes Yes Yes    Intervention Provide education and demonstration on how to check pulse in carotid and  radial arteries.;Review the importance of being able to check your own pulse for safety during independent exercise Provide education and demonstration on how to check pulse in carotid and radial  arteries.;Review the importance of being able to check your own pulse for safety during independent exercise Provide education and demonstration on how to check pulse in carotid and radial arteries.;Review the importance of being able to check your own pulse for safety during independent exercise Provide education and demonstration on how to check pulse in carotid and radial arteries.;Review the importance of being able to check your own pulse for safety during independent exercise    Expected Outcomes Short Term: Able to explain why pulse checking is important during independent exercise;Long Term: Able to check pulse independently and accurately Short Term: Able to explain why pulse checking is important during independent exercise;Long Term: Able to check pulse independently and accurately Short Term: Able to explain why pulse checking is important during independent exercise;Long Term: Able to check pulse independently and accurately Short Term: Able to explain why pulse checking is important during independent exercise;Long Term: Able to check pulse independently and accurately    Understanding of Exercise Prescription Yes Yes Yes Yes    Intervention Provide education, explanation, and written materials on patient's individual exercise prescription Provide education, explanation, and written materials on patient's individual exercise prescription Provide education, explanation, and written materials on patient's individual exercise prescription Provide education, explanation, and written materials on patient's individual exercise prescription    Expected Outcomes Short Term: Able to explain program exercise prescription;Long Term: Able to explain home exercise prescription to exercise independently Short Term: Able  to explain program exercise prescription;Long Term: Able to explain home exercise prescription to exercise independently Short Term: Able to explain program exercise prescription;Long Term: Able to explain home exercise prescription to exercise independently Short Term: Able to explain program exercise prescription;Long Term: Able to explain home exercise prescription to exercise independently             Exercise Goals Re-Evaluation:  Exercise Goals Re-Evaluation     Row Name 02/24/22 1510 03/24/22 1327 04/23/22 0725         Exercise Goal Re-Evaluation   Exercise Goals Review Increase Physical Activity;Able to understand and use rate of perceived exertion (RPE) scale;Increase Strength and Stamina;Knowledge and understanding of Target Heart Rate Range (THRR);Able to check pulse independently;Understanding of Exercise Prescription Increase Physical Activity;Increase Strength and Stamina;Able to understand and use rate of perceived exertion (RPE) scale;Knowledge and understanding of Target Heart Rate Range (THRR);Able to check pulse independently;Understanding of Exercise Prescription Increase Physical Activity;Increase Strength and Stamina;Able to understand and use rate of perceived exertion (RPE) scale;Knowledge and understanding of Target Heart Rate Range (THRR);Able to check pulse independently;Understanding of Exercise Prescription     Comments Pt has completed his first session of cardiac rehab. He is very motivated and ready to get started. He is currently exercising at 7.81 METs on the stepper. Will contineut to monitor and progress as able. Pt has completed 11 sessions of cardiac rehab. He continues to be very motivated during rehab. He is hoping to return to work in a month. He is curretnly exercising at 7.5 METs on the ellp. Will continue to monitor and progress as able. Pt has completed 20 sessions of cardiac rehab. He is motivated while in rehab and pushes himself. He may have to go back  to work next week, but he is hopeful that he surgeon will write him out for another month, so he can attend another month of rehab. He has a very physical job, so he is worried that he is not quite ready yet. He is currently exercising at 5.3 METs on  the elliptical. Will continue to monitor and progress as able.     Expected Outcomes Through exercise at rehab and home, the patient will meet their stated goals. Through exercise at rehab and home, the patient will meet their stated goals. Through exercise at rehab and home, the patient will meet their stated goals.              Nutrition & Weight - Outcomes:  Pre Biometrics - 02/20/22 1415       Pre Biometrics   Height 5\' 10"  (1.778 m)    Weight 247 lb 12.8 oz (112.4 kg)    Waist Circumference 44 inches    Hip Circumference 41 inches    Waist to Hip Ratio 1.07 %    BMI (Calculated) 35.56    Triceps Skinfold 30 mm    % Body Fat 34.7 %    Grip Strength 33 kg    Flexibility 0 in    Single Leg Stand 45 seconds             Post Biometrics - 05/14/22 1421        Post  Biometrics   Height 5\' 10"  (1.778 m)    Weight 236 lb 8.9 oz (107.3 kg)    Waist Circumference 42.5 inches    Hip Circumference 40 inches    Waist to Hip Ratio 1.06 %    BMI (Calculated) 33.94    Triceps Skinfold 20 mm    % Body Fat 30.4 %    Grip Strength 36.7 kg    Flexibility 0 in    Single Leg Stand 54.02 seconds             Nutrition:  Nutrition Therapy & Goals - 02/20/22 1407       Personal Nutrition Goals   Comments Patient scored 46 on his diet assessment. Handouts on healthier choices and DM information provided and explained. We offer 2 educational sessions on heart healthy nutrition with handouts and assistance with RD referral if patient is interested.      Intervention Plan   Intervention Nutrition handout(s) given to patient.    Expected Outcomes Short Term Goal: Understand basic principles of dietary content, such as calories, fat,  sodium, cholesterol and nutrients.             Nutrition Discharge:  Nutrition Assessments - 05/20/22 0915       MEDFICTS Scores   Pre Score 46    Post Score 15    Score Difference -31             Education Questionnaire Score:  Knowledge Questionnaire Score - 05/20/22 0914       Knowledge Questionnaire Score   Pre Score 21/24    Post Score 22/24             Goals reviewed with patient; copy given to patient. Pt graduated from CR after 27 sessions. He was able to improve his walk test distance by 12.8%, and his MET level at discharge was 8.42. He will continue to exercise by going to [employer] and doing aerobic and anaerobic training.

## 2022-05-22 ENCOUNTER — Ambulatory Visit (INDEPENDENT_AMBULATORY_CARE_PROVIDER_SITE_OTHER): Payer: BC Managed Care – PPO | Admitting: Cardiology

## 2022-05-22 ENCOUNTER — Encounter (HOSPITAL_BASED_OUTPATIENT_CLINIC_OR_DEPARTMENT_OTHER): Payer: Self-pay | Admitting: Cardiology

## 2022-05-22 VITALS — BP 168/82 | HR 63 | Wt 246.0 lb

## 2022-05-22 DIAGNOSIS — E7800 Pure hypercholesterolemia, unspecified: Secondary | ICD-10-CM

## 2022-05-22 DIAGNOSIS — E1159 Type 2 diabetes mellitus with other circulatory complications: Secondary | ICD-10-CM

## 2022-05-22 DIAGNOSIS — Z951 Presence of aortocoronary bypass graft: Secondary | ICD-10-CM | POA: Diagnosis not present

## 2022-05-22 DIAGNOSIS — Z8249 Family history of ischemic heart disease and other diseases of the circulatory system: Secondary | ICD-10-CM

## 2022-05-22 DIAGNOSIS — I10 Essential (primary) hypertension: Secondary | ICD-10-CM | POA: Diagnosis not present

## 2022-05-22 DIAGNOSIS — I25118 Atherosclerotic heart disease of native coronary artery with other forms of angina pectoris: Secondary | ICD-10-CM

## 2022-05-22 DIAGNOSIS — N521 Erectile dysfunction due to diseases classified elsewhere: Secondary | ICD-10-CM

## 2022-05-22 DIAGNOSIS — I77819 Aortic ectasia, unspecified site: Secondary | ICD-10-CM

## 2022-05-22 MED ORDER — CARVEDILOL 6.25 MG PO TABS: 6.2500 mg | ORAL_TABLET | Freq: Two times a day (BID) | ORAL | 3 refills | Status: AC

## 2022-05-22 NOTE — Progress Notes (Signed)
Cardiology Office Note:    Date:  05/22/2022   ID:  Jonathan Riley, DOB [birth year], MRN [id number]  PCP:  Gaynelle Arabian, MD  Cardiologist:  Buford Dresser, MD  Referring MD: Gaynelle Arabian, MD   CC: Follow-up  History of Present Illness:    Jonathan Riley is a 62 y.o. male with a hx of CAD s/p CABG x5, hypertension, type II diabetes, hyperlipidemia who is seen for follow-up. He was initially seen 11/14/2021 as a new consult at the request of Gaynelle Arabian, MD for the evaluation and management of dyspnea on exertion.  Cardiovascular history:  Comorbid conditions: hypertension--about 7-8 years, hyperlipidemia--was on a statin a few years ago (doesn't recall which one), had joint aches which improved off statin, diabetes--few years. Denies chronic kidney disease  Metabolic syndrome/Obesity: BMI 35 Chronic inflammatory conditions: none Tobacco use history: former smoker, smoked about 23 years, quit, then started back for a few years, then quit again. Family history: father had MI age 48. Maternal grandfather had several MI. Paternal uncle died of MI as well. Maternal grandmother had a stroke. Maternal aunt had a stroke.Brother was a Company secretary, has Parkinson's, HTN, DM.  A coronary CT was ordered, which showed a coronary calcium score of 1633 on 12/03/2021. 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 At one time when finished with work he had pharyngeal pain associated with tremors. He had been fearful that he was having a heart attack. He was previously intolerant of rosuvastatin due to myalgias. He was started on atorvastatin, metoprolol, and SL NG PRN. Changed aspirin to 81 mg dose. We discussed trialing medical management first vs. going to cath. After shared decision making, we proceeded with heart catheterization 12/20/2021 which revealed left main trunk 25 to 30% stenosis and possibly catheter  induced dissection/trauma at the time of first injection. Proximal to mid segmental 60 to 70% narrowing. Severely diseased circumflex with 70% first obtuse marginal 80% segmental second marginal and total occlusion of the distal circumflex. High-grade mid RCA stenosis, greater than 95% with TIMI-3 flow.  Proximal RCA and PDA 60 to 70% stenoses. Severe elevation in LVEDP, 26 mmHg.  Systolic function was normal. He underwent 5V CABG on 12/24/21.  At his last appointment he was recovering from his surgery. He noted improvement in his DOE and only minor pain with the incisional site. He complained of erythema and LE edema of his right ankle and shin, as well as infrequent flutters. No changes made; planned to monitor his LE symptoms.  Today, he is feeling well. He is exercising by walking 1 mile in 15 minutes on the treadmill; he plans to work up to 10 minutes. No anginal or other symptoms. At cardiac rehab his blood pressure has been monitored before and after exercise, averaging 140s/60s-80s. At one time it was as low as 90s/50s. In clinic today his BP is elevated at 170/76 (168/82 on recheck). He has completed the cardiac rehab.  If he is on his feet all day, he may notice some mild swelling in his hands and feet. This eventually improves.  Previously he would become dizzy with bending over such as tying his shoes. Since cutting back on one of his medications this seems to have resolved.  He is compliant with his 325 mg aspirin. He denies any bleeding issues. No bruising.  Since beginning the [facility], his home blood sugars have been averaging 97-104. He has successfully  cut back on meat and animal products in his diet.  He denies any palpitations, chest pain, shortness of breath, headaches, syncope, orthopnea, or PND.  He notes that his ED medication has not been as effective lately. No improvement despite his increased exercise.   Past Medical History:  Diagnosis Date   Arthritis    Depression     Diabetes mellitus without complication (HCC)    Dyspnea    "with exertion"   Hypertension    Nocturia    1-2 times during night   PONV (postoperative nausea and vomiting)    "nausea after a knee surgery"    Past Surgical History:  Procedure Laterality Date   COLONOSCOPY     CORONARY ARTERY BYPASS GRAFT N/A 12/24/2021   Procedure: CORONARY ARTERY BYPASS GRAFTING (CABG) X 5 BYPASSES USING OPEN LEFT INTERNAL MAMMARY ARTERY, OPEN LEFT RADIAL ARTERY, AND OPEN RIGHT GREATER SAPHENOUS VEIN HARVEST.;  Surgeon: Corliss Skains, MD;  Location: [facility] OR;  Service: Open Heart Surgery;  Laterality: N/A;   KNEE SURGERY Bilateral    Right, Left x2   LEFT HEART CATH AND CORONARY ANGIOGRAPHY N/A 12/20/2021   Procedure: LEFT HEART CATH AND CORONARY ANGIOGRAPHY;  Surgeon: Lyn Records, MD;  Location: [facility] INVASIVE CV LAB;  Service: Cardiovascular;  Laterality: N/A;   TEE WITHOUT CARDIOVERSION N/A 12/24/2021   Procedure: TRANSESOPHAGEAL ECHOCARDIOGRAM (TEE);  Surgeon: Corliss Skains, MD;  Location: [facility] OR;  Service: Open Heart Surgery;  Laterality: N/A;   TOTAL HIP ARTHROPLASTY Right 03/03/2016   Procedure: TOTAL HIP ARTHROPLASTY ANTERIOR APPROACH;  Surgeon: Samson Frederic, MD;  Location: [facility] OR;  Service: Orthopedics;  Laterality: Right;   WRIST SURGERY Right 2008 or 2009    Current Medications: Current Outpatient Medications on File Prior to Visit  Medication Sig   acetaminophen (TYLENOL) 500 MG tablet Take 1,000 mg by mouth every 6 (six) hours as needed (pain.).   amLODipine (NORVASC) 10 MG tablet Take 1 tablet (10 mg total) by mouth daily.   aspirin EC 325 MG tablet Take 1 tablet (325 mg total) by mouth daily.   atorvastatin (LIPITOR) 80 MG tablet Take 1 tablet (80 mg total) by mouth daily.   diphenhydrAMINE (BENADRYL) 25 mg capsule Take 25 mg by mouth at bedtime as needed for sleep.   ibuprofen (ADVIL) 200 MG tablet Take 400 mg by mouth every 8 (eight) hours as needed (pain.).   metFORMIN  (GLUCOPHAGE-XR) 500 MG 24 hr tablet Take 500 mg by mouth daily.   sildenafil (REVATIO) 20 MG tablet Take 40-100 mg by mouth daily as needed (erectile dysfunction).   Sodium Bicarbonate (NICE PURE BAKING SODA) POWD Take 1 Dose by mouth 2 (two) times daily as needed (acid reflux/indigestion). Baking Soda Mixed with water if needed for reflux/indigestion.   telmisartan (MICARDIS) 80 MG tablet Take 80 mg by mouth daily.   tirzepatide [facility]) 5 MG/0.5ML Pen Inject 5 mg into the skin once a week.   No current facility-administered medications on file prior to visit.     Allergies:   Percocet [oxycodone-acetaminophen] and Crestor [rosuvastatin]   Social History   Tobacco Use   Smoking status: Former    Types: Cigarettes   Smokeless tobacco: Never   Tobacco comments:    "quit about 12 years ago" 02/26/2016  Substance Use Topics   Alcohol use: No   Drug use: No    Family History: family history includes Cancer in his unknown relative; Diabetes in his unknown relative; Heart disease in his  unknown relative.  ROS:   Please see the history of present illness. All other systems are reviewed and negative.    EKGs/Labs/Other Studies Reviewed:    The following studies were reviewed today:  Intraoperative TEE  12/24/2021: POST-OP IMPRESSIONS  _ Left Ventricle: Post Bypass:  The patient came off bypass on the initial attempt. There did not appear  to be any new findings from preop findings. Left ventricular contraction did  improve with volume replacement. The TEE that had been placed after induction uneventfully was removed without difficulty. The patient was later taken to the SICU in stable conditon.   PREOPERATIVE VASCULAR EVALUATION  12/21/2021: Summary:  Right Carotid: The extracranial vessels were near-normal with only minimal wall thickening or plaque.   Left Carotid: The extracranial vessels were near-normal with only minimal  wall thickening or plaque.   Vertebrals:  Bilateral  vertebral arteries demonstrate antegrade flow.   Subclavians: Normal flow hemodynamics were seen in bilateral subclavian arteries.   Right ABI: Resting right ankle-brachial index is within normal range. The  right toe-brachial index is normal.   Left ABI: Resting left ankle-brachial index is within normal range. The  left toe-brachial index is normal.   Right Upper Extremity: Doppler waveforms remain within normal limits with right radial compression. Doppler waveforms remain within normal limits with right ulnar compression.   Left Upper Extremity: Doppler waveforms remain within normal limits with  left radial compression. Doppler waveforms remain within normal limits  with left ulnar compression.   Left Heart Cath  12/20/2021: CONCLUSIONS: Left main trunk 25 to 30% stenosis and possibly catheter induced dissection/trauma at the time of first injection. Proximal to mid segmental 60 to 70% narrowing. Severely diseased circumflex with 70% first obtuse marginal 80% segmental second marginal and total occlusion of the distal circumflex. High-grade mid RCA stenosis, greater than 95% with TIMI-3 flow.  Proximal RCA and PDA 60 to 70% stenoses. Severe elevation in LVEDP, 26 mmHg.  Systolic function is normal.   Recommendation:   Observation overnight because of left main situation. Consult colleagues and also have TCT [place] to look at the patient's pictures to help develop a treatment plan.  Because of severity of RCA stenosis we need to revascularize in some form or fashion within the next several days to 1 week. Potentially, if left main is a ruptured plaque, we should consider coronary bypass grafting.  If not, consider PCI of RCA and medical treatment of the circumflex territory with clinical observation of left coronary and left main disease. In the meantime, we should treat diastolic heart failure aggressively.  Diagnostic: Dominance: Right   Coronary CTA  12/03/2021: IMPRESSION: 1.  Coronary calcium score of 1633. This was 61 percentile for age-, sex, and race-matched controls.   2. Normal coronary origin with right dominance.   3. Moderate (50-69) stenoses in the LAD, Lcx, OM2 and RCA; the distal Lcx appears to be occluded.   4. Dilated ascending aorta; aortic atherosclerosis.   5. Study will be sent for FFR.   RECOMMENDATIONS: CAD-RADS 5: Total coronary occlusion (100%). Consider cardiac catheterization or viability assessment. Consider symptom-guided anti-ischemic pharmacotherapy as well as risk factor modification per guideline directed care.  FFRCT Analysis  12/03/2021: FINDINGS: FFRct analysis was performed on the original cardiac CT angiogram dataset. Diagrammatic representation of the FFRct analysis is provided in a separate PDF document in PACS. This dictation was created using the PDF document and an interactive 3D model of the results. 3D model is not available in  the EMR/PACS. Normal FFR range is >0.80.   1. Left Main:   2. LAD: findings 0.81, 0.72, 0.69 very distal vessel D1 0.86 D2 0.79   3. LCX: findings 0.80, 0.58, CTO   4. Ramus: findings 0.90   5. RCA: findings 0.91, 0.76   IMPRESSION: FFR suggests distal LAD and distal RCA flow limiting but in very distal vessel; mid Lcx lesion flow limiting followed by CTO.  Echo  11/27/2021:  1. Left ventricular ejection fraction, by estimation, is 60 to 65%. The  left ventricle has normal function. The left ventricle has no regional  wall motion abnormalities. There is moderate left ventricular hypertrophy.  Left ventricular diastolic  parameters are indeterminate.   2. Right ventricular systolic function is normal. The right ventricular  size is normal. Tricuspid regurgitation signal is inadequate for assessing  PA pressure.   3. The mitral valve is normal in structure. Trivial mitral valve  regurgitation. No evidence of mitral stenosis.   4. The aortic valve was not well visualized. Aortic  valve regurgitation  is trivial. No aortic stenosis is present.   5. Aortic dilatation noted. There is dilatation of the ascending aorta,  measuring 43 mm.   CTA Chest  05/06/2017: FINDINGS: Cardiovascular: There is mild cardiomegaly. Multi vessel coronary vascular calcification. There is no pericardial effusion. Mild atherosclerotic calcification of the thoracic aorta. Mild the origins of the great vessels of the aortic arch are patent. Evaluation of the pulmonary arteries is limited due to respiratory motion artifact and suboptimal enhancement of the peripheral branches. No large or central pulmonary artery embolus identified.   Mediastinum/Nodes: No hilar or mediastinal adenopathy. The esophagus is grossly unremarkable. No mediastinal fluid collection.   Lungs/Pleura: Right lung base linear atelectatic changes noted. There is no focal consolidation, pleural effusion, or pneumothorax. The central airways are patent.   Upper Abdomen: Diffuse fatty infiltration of the liver. The visualized upper abdomen is otherwise unremarkable.   Musculoskeletal: No chest wall abnormality. No acute or significant osseous findings.   Review of the MIP images confirms the above findings.   IMPRESSION: 1. No acute intrathoracic pathology. No CT evidence of central pulmonary artery embolus. 2. Mild cardiomegaly and coronary vascular calcification. 3. Right lung base linear atelectasis. 4. Fatty liver. 5. Mild Aortic Atherosclerosis (ICD10-I70.0).  EKG:  EKG is personally reviewed.   05/22/2022:  EKG was not ordered. 02/10/2022:  NSR at 64 bpm, Twi inferolateral 12/10/2021:  NSR, PRWP at 64 bpm 11/14/21 Possible ectopic rhythm (unusual p axis), rate 68 bpm, PRWP  Recent Labs: 12/25/2021: Magnesium 2.2 12/30/2021: Hemoglobin 11.0; Platelets 187 01/20/2022: BUN 14; Creatinine, Ser 0.93; Potassium 4.7; Sodium 140 03/18/2022: ALT 21   Recent Lipid Panel    Component Value Date/Time   CHOL 140  03/18/2022 1039   TRIG 166 (H) 03/18/2022 1039   HDL 34 (L) 03/18/2022 1039   CHOLHDL 4.1 03/18/2022 1039   LDLCALC 77 03/18/2022 1039    Physical Exam:    VS:  BP (!) 168/82 (BP Location: Right Arm, Patient Position: Sitting, Cuff Size: Large)   Pulse 63   Wt 246 lb (111.6 kg)   SpO2 95%   BMI 35.30 kg/m     Wt Readings from Last 3 Encounters:  05/22/22 246 lb (111.6 kg)  05/14/22 236 lb 8.9 oz (107.3 kg)  04/07/22 253 lb 8.5 oz (115 kg)    GEN: Well nourished, well developed in no acute distress HEENT: Normal, moist mucous membranes NECK: No JVD CARDIAC: regular rhythm,  normal S1 and S2, no rubs or gallops. No murmur. VASCULAR: Radial and DP pulses 2+ bilaterally. No carotid bruits RESPIRATORY:  Clear to auscultation without rales, wheezing or rhonchi  ABDOMEN: Soft, non-tender, non-distended MUSCULOSKELETAL:  Ambulates independently SKIN: Warm and dry, no significant edema NEUROLOGIC:  Alert and oriented x 3. No focal neuro deficits noted. PSYCHIATRIC:  Normal affect    ASSESSMENT:    1. Essential hypertension   2. Coronary artery disease of native artery of native heart with stable angina pectoris ([place])   3. S/P CABG x 5   4. Pure hypercholesterolemia   5. Aortic dilatation (HCC)   6. Family history of heart disease   7. Type II diabetes circulatory disorder causing erectile dysfunction (HCC)     PLAN:    Dyspnea on exertion, Throat pain on exertion-->angina Coronary CT consistent with CTO of Lcx, obstructive CAD S/P 5V CABG 9/023 Hypercholesterolemia Statin myalgia -doing well, completed -continue aspirin, atorvastatin. Had myalgia on rosuvastatin in the past -LDL goal <70, if cannot get to goal would use PCSK9i. With lifestyle changes, would recheck at next visit  Hypertension Aortic dilation -keep BP well controlled, not at goal today -continue telmisartan -will change metoprolol to carvedilol to get improved BP control -decreased amlodipine dose  recently due to bendopnea -may need to add thiazide or spironolactone if BP remains elevated -he will send me BP and HR readings in a few weeks.   ED: has noted sildenafil has not worked as well since his surgery. Recommended either trialing another PDE5i or consider seeing urology  Cardiac risk counseling and prevention recommendations: Family history of heart disease -recommend heart healthy/Mediterranean diet, with whole grains, fruits, vegetable, fish, lean meats, nuts, and olive oil. Limit salt. -recommend moderate walking, 3-5 times/week for 30-50 minutes each session. Aim for at least 150 minutes.week. Goal should be pace of 3 miles/hours, or walking 1.5 miles in 30 minutes -recommend avoidance of tobacco products. Avoid excess alcohol.  Plan for follow up: 4-6 weeks or sooner as needed.  Buford Dresser, MD, PhD, [facility]    Medication Adjustments/Labs and Tests Ordered: Current medicines are reviewed at length with the patient today.  Concerns regarding medicines are outlined above.   No orders of the defined types were placed in this encounter.  Meds ordered this encounter  Medications   carvedilol (COREG) 6.25 MG tablet    Sig: Take 1 tablet (6.25 mg total) by mouth 2 (two) times daily.    Dispense:  180 tablet    Refill:  3   Patient Instructions  Medication Instructions:  We are going to change the metoprolol to carvedilol to try to improve the blood pressure. Don't take both together. We will start at the 6.25 mg twice a day dose. I'd like your blood pressure to be <130 on the top number. Check your blood pressure at home and your heart rate, and send me numbers in a few weeks.  *If you need a refill on your cardiac medications before your next appointment, please call your pharmacy*   Lab Work: None  If you have labs (blood work) drawn today and your tests are completely normal, you will receive your results only by: [place]  (if you have MyChart) OR A paper copy in the mail If you have any lab test that is abnormal or we need to change your treatment, we will call you to review the results.   Testing/Procedures:  None   Follow-Up: At  [facility], you and your health needs are our priority.  As part of our continuing mission to provide you with exceptional heart care, we have created designated Provider Care Teams.  These Care Teams include your primary Cardiologist (physician) and Advanced Practice Providers (APPs -  Physician Assistants and Nurse Practitioners) who all work together to provide you with the care you need, when you need it.  We recommend signing up for the patient portal called "MyChart".  Sign up information is provided on this After Visit Summary.  MyChart is used to connect with patients for Virtual Visits (Telemedicine).  Patients are able to view lab/test results, encounter notes, upcoming appointments, etc.  Non-urgent messages can be sent to your provider as well.   To learn more about what you can do with MyChart, go to [email or web address].    Your next appointment:   5 week(s)  Provider:   Jodelle Red, MD    Other Instructions Check Bp    -how to check blood pressure:  -sit comfortably in a chair, feet uncrossed and flat on floor, for 5-10 minutes  -arm ideally should rest at the level of the heart. However, arm should be relaxed and not tense (for example, do not hold the arm up unsupported)  -avoid exercise, caffeine, and tobacco for at least 30 minutes prior to BP reading  -don't take BP cuff reading over clothes (always place on skin directly)  -I prefer to know how well the medication is working, so I would like you to take your readings 1-2 hours after taking your blood pressure medication if possible 4  Also, as we discussed, if the sildenafil is no longer working for your ED, it might be time to see a urologist.  Salli Real Stumpf,acting as a scribe  for Jodelle Red, MD.,have documented all relevant documentation on the behalf of Jodelle Red, MD,as directed by  Jodelle Red, MD while in the presence of Jodelle Red, MD.  I, Jodelle Red, MD, have reviewed all documentation for this visit. The documentation on 05/22/22 for the exam, diagnosis, procedures, and orders are all accurate and complete.   Signed, Jodelle Red, MD PhD 05/22/2022     [facility]

## 2022-05-22 NOTE — Patient Instructions (Addendum)
Medication Instructions:  We are going to change the metoprolol to carvedilol to try to improve the blood pressure. Don't take both together. We will start at the 6.25 mg twice a day dose. I'd like your blood pressure to be <130 on the top number. Check your blood pressure at home and your heart rate, and send me numbers in a few weeks.  *If you need a refill on your cardiac medications before your next appointment, please call your pharmacy*   Lab Work: None  If you have labs (blood work) drawn today and your tests are completely normal, you will receive your results only by: [place] (if you have MyChart) OR A paper copy in the mail If you have any lab test that is abnormal or we need to change your treatment, we will call you to review the results.   Testing/Procedures:  None   Follow-Up: At [facility], you and your health needs are our priority.  As part of our continuing mission to provide you with exceptional heart care, we have created designated Provider Care Teams.  These Care Teams include your primary Cardiologist (physician) and Advanced Practice Providers (APPs -  Physician Assistants and Nurse Practitioners) who all work together to provide you with the care you need, when you need it.  We recommend signing up for the patient portal called "MyChart".  Sign up information is provided on this After Visit Summary.  MyChart is used to connect with patients for Virtual Visits (Telemedicine).  Patients are able to view lab/test results, encounter notes, upcoming appointments, etc.  Non-urgent messages can be sent to your provider as well.   To learn more about what you can do with MyChart, go to [email or web address].    Your next appointment:   5 week(s)  Provider:   Buford Dresser, MD    Other Instructions Check Bp    -how to check blood pressure:  -sit comfortably in a chair, feet uncrossed and flat on floor, for 5-10 minutes  -arm ideally  should rest at the level of the heart. However, arm should be relaxed and not tense (for example, do not hold the arm up unsupported)  -avoid exercise, caffeine, and tobacco for at least 30 minutes prior to BP reading  -don't take BP cuff reading over clothes (always place on skin directly)  -I prefer to know how well the medication is working, so I would like you to take your readings 1-2 hours after taking your blood pressure medication if possible 4  Also, as we discussed, if the sildenafil is no longer working for your ED, it might be time to see a urologist.

## 2022-05-23 ENCOUNTER — Encounter (HOSPITAL_BASED_OUTPATIENT_CLINIC_OR_DEPARTMENT_OTHER): Payer: Self-pay

## 2022-05-26 NOTE — Telephone Encounter (Signed)
Please advise 

## 2022-05-28 ENCOUNTER — Ambulatory Visit: Payer: BC Managed Care – PPO | Admitting: Psychology

## 2022-06-15 ENCOUNTER — Encounter (HOSPITAL_BASED_OUTPATIENT_CLINIC_OR_DEPARTMENT_OTHER): Payer: Self-pay

## 2022-06-19 ENCOUNTER — Ambulatory Visit (INDEPENDENT_AMBULATORY_CARE_PROVIDER_SITE_OTHER): Payer: BC Managed Care – PPO | Admitting: Cardiology

## 2022-06-19 ENCOUNTER — Encounter (HOSPITAL_BASED_OUTPATIENT_CLINIC_OR_DEPARTMENT_OTHER): Payer: Self-pay | Admitting: Cardiology

## 2022-06-19 VITALS — BP 134/66 | HR 68 | Ht 70.0 in | Wt 223.0 lb

## 2022-06-19 DIAGNOSIS — E7800 Pure hypercholesterolemia, unspecified: Secondary | ICD-10-CM | POA: Diagnosis not present

## 2022-06-19 DIAGNOSIS — Z951 Presence of aortocoronary bypass graft: Secondary | ICD-10-CM

## 2022-06-19 DIAGNOSIS — I77819 Aortic ectasia, unspecified site: Secondary | ICD-10-CM

## 2022-06-19 DIAGNOSIS — Z8249 Family history of ischemic heart disease and other diseases of the circulatory system: Secondary | ICD-10-CM

## 2022-06-19 DIAGNOSIS — I25118 Atherosclerotic heart disease of native coronary artery with other forms of angina pectoris: Secondary | ICD-10-CM

## 2022-06-19 DIAGNOSIS — I10 Essential (primary) hypertension: Secondary | ICD-10-CM

## 2022-06-19 DIAGNOSIS — E1159 Type 2 diabetes mellitus with other circulatory complications: Secondary | ICD-10-CM

## 2022-06-19 DIAGNOSIS — T466X5A Adverse effect of antihyperlipidemic and antiarteriosclerotic drugs, initial encounter: Secondary | ICD-10-CM

## 2022-06-19 DIAGNOSIS — M791 Myalgia, unspecified site: Secondary | ICD-10-CM

## 2022-06-19 DIAGNOSIS — N521 Erectile dysfunction due to diseases classified elsewhere: Secondary | ICD-10-CM

## 2022-06-19 MED ORDER — AMLODIPINE BESYLATE 5 MG PO TABS: 5.0000 mg | ORAL_TABLET | Freq: Two times a day (BID) | ORAL | 3 refills | Status: AC

## 2022-06-19 MED ORDER — AMLODIPINE BESYLATE 5 MG PO TABS: 10.0000 mg | ORAL_TABLET | Freq: Two times a day (BID) | ORAL | 3 refills | Status: DC

## 2022-06-19 NOTE — Progress Notes (Signed)
Cardiology Office Note:    Date:  06/19/2022   ID:  ARSLAN COPUS, DOB 02-Oct-1960, MRN [id number]  PCP:  Gaynelle Arabian, MD  Cardiologist:  Buford Dresser, MD  Referring MD: Gaynelle Arabian, MD   CC: Follow-up  History of Present Illness:    Jonathan Riley is a 62 y.o. male with a hx of CAD s/p CABG x5, hypertension, type II diabetes, hyperlipidemia who is seen for follow-up. He was initially seen 11/14/2021 as a new consult at the request of Gaynelle Arabian, MD for the evaluation and management of dyspnea on exertion.  Cardiovascular history:  Comorbid conditions: hypertension--about 7-8 years, hyperlipidemia--was on a statin a few years ago (doesn't recall which one), had joint aches which improved off statin, diabetes--few years. Denies chronic kidney disease  Metabolic syndrome/Obesity: BMI 35 Chronic inflammatory conditions: none Tobacco use history: former smoker, smoked about 23 years, quit, then started back for a few years, then quit again. Family history: father had MI age 50. Maternal grandfather had several MI. Paternal uncle died of MI as well. Maternal grandmother had a stroke. Maternal aunt had a stroke.Brother was a Company secretary, has Parkinson's, HTN, DM.  A coronary CT was ordered, which showed a coronary calcium score of 1633 on 12/03/2021. 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 At one time when finished with work he had pharyngeal pain associated with tremors. He had been fearful that he was having a heart attack. He was previously intolerant of rosuvastatin due to myalgias. He was started on atorvastatin, metoprolol, and SL NG PRN. Changed aspirin to 81 mg dose. We discussed trialing medical management first vs. going to cath. After shared decision making, we proceeded with heart catheterization 12/20/2021 which revealed left main trunk 25 to 30% stenosis and possibly catheter  induced dissection/trauma at the time of first injection. Proximal to mid segmental 60 to 70% narrowing. Severely diseased circumflex with 70% first obtuse marginal 80% segmental second marginal and total occlusion of the distal circumflex. High-grade mid RCA stenosis, greater than 95% with TIMI-3 flow.  Proximal RCA and PDA 60 to 70% stenoses. Severe elevation in LVEDP, 26 mmHg.  Systolic function was normal. He underwent 5V CABG on 12/24/21.  At his last appointment, he was feeling well and exercising routinely. Blood pressures had averaged 140s/60-80s while at cardiac rehab, once in the 90s/50s. In the office his BP was uncontrolled up to [id number] systolic. Metoprolol was switched to Carvedilol. He also noted sildenafil had not worked as well since his surgery. Recommended either trialing another PDE5i or consider seeing urology.  Today, he states that he experienced dizziness on 10 mg amlodipine, which was subsequently decreased to 5 mg. He then started taking 5 mg twice daily when he started the carvedilol. Since this change he denies recurrent dizziness and feels great overall.  He provided a BP log which was personally reviewed and shows stable blood pressures.  Sometimes after standing for a long period of time, he will feel swelling in his fingers and legs. His swelling will improve by the next morning.  Last week he completed 2 miles on the treadmill. This week he has been exercising for 30 minutes on an incline. He is working towards completing 1 mile in 10 minutes. Last LDL 77.  His ED medication remains ineffective. Recommended he see his urologist.  He denies any palpitations, chest pain, shortness of breath, lightheadedness, headaches, syncope, orthopnea, or  PND.   Past Medical History:  Diagnosis Date   Arthritis    Depression    Diabetes mellitus without complication ([place])    Dyspnea    "with exertion"   Hypertension    Nocturia    1-2 times during night   PONV (postoperative  nausea and vomiting)    "nausea after a knee surgery"    Past Surgical History:  Procedure Laterality Date   COLONOSCOPY     CORONARY ARTERY BYPASS GRAFT N/A 12/24/2021   Procedure: CORONARY ARTERY BYPASS GRAFTING (CABG) X 5 BYPASSES USING OPEN LEFT INTERNAL MAMMARY ARTERY, OPEN LEFT RADIAL ARTERY, AND OPEN RIGHT GREATER SAPHENOUS VEIN HARVEST.;  Surgeon: Lajuana Matte, MD;  Location: [facility];  Service: Open Heart Surgery;  Laterality: N/A;   KNEE SURGERY Bilateral    Right, Left x2   LEFT HEART CATH AND CORONARY ANGIOGRAPHY N/A 12/20/2021   Procedure: LEFT HEART CATH AND CORONARY ANGIOGRAPHY;  Surgeon: Belva Crome, MD;  Location: [facility];  Service: Cardiovascular;  Laterality: N/A;   TEE WITHOUT CARDIOVERSION N/A 12/24/2021   Procedure: TRANSESOPHAGEAL ECHOCARDIOGRAM (TEE);  Surgeon: Lajuana Matte, MD;  Location: [facility];  Service: Open Heart Surgery;  Laterality: N/A;   TOTAL HIP ARTHROPLASTY Right 03/03/2016   Procedure: TOTAL HIP ARTHROPLASTY ANTERIOR APPROACH;  Surgeon: Rod Can, MD;  Location: [facility];  Service: Orthopedics;  Laterality: Right;   WRIST SURGERY Right 2008 or 2009    Current Medications: Current Outpatient Medications on File Prior to Visit  Medication Sig   acetaminophen (TYLENOL) 500 MG tablet Take 1,000 mg by mouth every 6 (six) hours as needed (pain.).   aspirin EC 325 MG tablet Take 1 tablet (325 mg total) by mouth daily.   atorvastatin (LIPITOR) 80 MG tablet Take 1 tablet (80 mg total) by mouth daily.   carvedilol (COREG) 6.25 MG tablet Take 1 tablet (6.25 mg total) by mouth 2 (two) times daily.   diphenhydrAMINE (BENADRYL) 25 mg capsule Take 25 mg by mouth at bedtime as needed for sleep.   ibuprofen (ADVIL) 200 MG tablet Take 400 mg by mouth every 8 (eight) hours as needed (pain.).   metFORMIN (GLUCOPHAGE-XR) 500 MG 24 hr tablet Take 500 mg by mouth daily.   sildenafil (REVATIO) 20 MG tablet Take 40-100 mg by mouth daily as needed  (erectile dysfunction).   Sodium Bicarbonate (NICE PURE BAKING SODA) POWD Take 1 Dose by mouth 2 (two) times daily as needed (acid reflux/indigestion). Baking Soda Mixed with water if needed for reflux/indigestion.   telmisartan (MICARDIS) 80 MG tablet Take 80 mg by mouth daily.   tirzepatide [facility]) 5 MG/0.5ML Pen Inject 5 mg into the skin once a week.   No current facility-administered medications on file prior to visit.     Allergies:   Percocet [oxycodone-acetaminophen] and Crestor [rosuvastatin]   Social History   Tobacco Use   Smoking status: Former    Types: Cigarettes   Smokeless tobacco: Never   Tobacco comments:    "quit about 12 years ago" 02/26/2016  Substance Use Topics   Alcohol use: No   Drug use: No    Family History: family history includes Cancer in his unknown relative; Diabetes in his unknown relative; Heart disease in his unknown relative.  ROS:   Please see the history of present illness. (+) Intermittent edema of bilateral fingers and legs All other systems are reviewed and negative.    EKGs/Labs/Other Studies Reviewed:    The following studies  were reviewed today:  Intraoperative TEE  12/24/2021: POST-OP IMPRESSIONS  _ Left Ventricle: Post Bypass:  The patient came off bypass on the initial attempt. There did not appear  to be any new findings from preop findings. Left ventricular contraction did  improve with volume replacement. The TEE that had been placed after induction uneventfully was removed without difficulty. The patient was later taken to the SICU in stable conditon.   PREOPERATIVE VASCULAR EVALUATION  12/21/2021: Summary:  Right Carotid: The extracranial vessels were near-normal with only minimal wall thickening or plaque.   Left Carotid: The extracranial vessels were near-normal with only minimal  wall thickening or plaque.   Vertebrals:  Bilateral vertebral arteries demonstrate antegrade flow.   Subclavians: Normal flow  hemodynamics were seen in bilateral subclavian arteries.   Right ABI: Resting right ankle-brachial index is within normal range. The  right toe-brachial index is normal.   Left ABI: Resting left ankle-brachial index is within normal range. The  left toe-brachial index is normal.   Right Upper Extremity: Doppler waveforms remain within normal limits with right radial compression. Doppler waveforms remain within normal limits with right ulnar compression.   Left Upper Extremity: Doppler waveforms remain within normal limits with  left radial compression. Doppler waveforms remain within normal limits  with left ulnar compression.   Left Heart Cath  12/20/2021: CONCLUSIONS: Left main trunk 25 to 30% stenosis and possibly catheter induced dissection/trauma at the time of first injection. Proximal to mid segmental 60 to 70% narrowing. Severely diseased circumflex with 70% first obtuse marginal 80% segmental second marginal and total occlusion of the distal circumflex. High-grade mid RCA stenosis, greater than 95% with TIMI-3 flow.  Proximal RCA and PDA 60 to 70% stenoses. Severe elevation in LVEDP, 26 mmHg.  Systolic function is normal.   Recommendation:   Observation overnight because of left main situation. Consult colleagues and also have TCT [place] to look at the patient's pictures to help develop a treatment plan.  Because of severity of RCA stenosis we need to revascularize in some form or fashion within the next several days to 1 week. Potentially, if left main is a ruptured plaque, we should consider coronary bypass grafting.  If not, consider PCI of RCA and medical treatment of the circumflex territory with clinical observation of left coronary and left main disease. In the meantime, we should treat diastolic heart failure aggressively.  Diagnostic: Dominance: Right   Coronary CTA  12/03/2021: IMPRESSION: 1. Coronary calcium score of 1633. This was 84 percentile for age-, sex, and  race-matched controls.   2. Normal coronary origin with right dominance.   3. Moderate (50-69) stenoses in the LAD, Lcx, OM2 and RCA; the distal Lcx appears to be occluded.   4. Dilated ascending aorta; aortic atherosclerosis.   5. Study will be sent for FFR.   RECOMMENDATIONS: CAD-RADS 5: Total coronary occlusion (100%). Consider cardiac catheterization or viability assessment. Consider symptom-guided anti-ischemic pharmacotherapy as well as risk factor modification per guideline directed care.  FFRCT Analysis  12/03/2021: FINDINGS: FFRct analysis was performed on the original cardiac CT angiogram dataset. Diagrammatic representation of the FFRct analysis is provided in a separate PDF document in PACS. This dictation was created using the PDF document and an interactive 3D model of the results. 3D model is not available in the EMR/PACS. Normal FFR range is >0.80.   1. Left Main:   2. LAD: findings 0.81, 0.72, 0.69 very distal vessel D1 0.86 D2 0.79   3. LCX: findings  0.80, 0.58, CTO   4. Ramus: findings 0.90   5. RCA: findings 0.91, 0.76   IMPRESSION: FFR suggests distal LAD and distal RCA flow limiting but in very distal vessel; mid Lcx lesion flow limiting followed by CTO.  Echo  11/27/2021:  1. Left ventricular ejection fraction, by estimation, is 60 to 65%. The  left ventricle has normal function. The left ventricle has no regional  wall motion abnormalities. There is moderate left ventricular hypertrophy.  Left ventricular diastolic  parameters are indeterminate.   2. Right ventricular systolic function is normal. The right ventricular  size is normal. Tricuspid regurgitation signal is inadequate for assessing  PA pressure.   3. The mitral valve is normal in structure. Trivial mitral valve  regurgitation. No evidence of mitral stenosis.   4. The aortic valve was not well visualized. Aortic valve regurgitation  is trivial. No aortic stenosis is present.   5.  Aortic dilatation noted. There is dilatation of the ascending aorta,  measuring 43 mm.   CTA Chest  05/06/2017: FINDINGS: Cardiovascular: There is mild cardiomegaly. Multi vessel coronary vascular calcification. There is no pericardial effusion. Mild atherosclerotic calcification of the thoracic aorta. Mild the origins of the great vessels of the aortic arch are patent. Evaluation of the pulmonary arteries is limited due to respiratory motion artifact and suboptimal enhancement of the peripheral branches. No large or central pulmonary artery embolus identified.   Mediastinum/Nodes: No hilar or mediastinal adenopathy. The esophagus is grossly unremarkable. No mediastinal fluid collection.   Lungs/Pleura: Right lung base linear atelectatic changes noted. There is no focal consolidation, pleural effusion, or pneumothorax. The central airways are patent.   Upper Abdomen: Diffuse fatty infiltration of the liver. The visualized upper abdomen is otherwise unremarkable.   Musculoskeletal: No chest wall abnormality. No acute or significant osseous findings.   Review of the MIP images confirms the above findings.   IMPRESSION: 1. No acute intrathoracic pathology. No CT evidence of central pulmonary artery embolus. 2. Mild cardiomegaly and coronary vascular calcification. 3. Right lung base linear atelectasis. 4. Fatty liver. 5. Mild Aortic Atherosclerosis (ICD10-I70.0).  EKG:  EKG is personally reviewed.   06/19/2022:  EKG was not ordered. 05/22/2022:  EKG was not ordered. 02/10/2022:  NSR at 64 bpm, Twi inferolateral 12/10/2021:  NSR, PRWP at 64 bpm 11/14/21 Possible ectopic rhythm (unusual p axis), rate 68 bpm, PRWP  Recent Labs: 12/25/2021: Magnesium 2.2 12/30/2021: Hemoglobin 11.0; Platelets 187 01/20/2022: BUN 14; Creatinine, Ser 0.93; Potassium 4.7; Sodium 140 03/18/2022: ALT 21   Recent Lipid Panel    Component Value Date/Time   CHOL 140 03/18/2022 1039   TRIG 166 (H)  03/18/2022 1039   HDL 34 (L) 03/18/2022 1039   CHOLHDL 4.1 03/18/2022 1039   LDLCALC 77 03/18/2022 1039    Physical Exam:    VS:  BP 134/66   Pulse 68   Ht '5\' 10"'$  (1.778 m)   Wt 223 lb (101.2 kg)   SpO2 98%   BMI 32.00 kg/m     Wt Readings from Last 3 Encounters:  06/19/22 223 lb (101.2 kg)  05/22/22 246 lb (111.6 kg)  05/14/22 236 lb 8.9 oz (107.3 kg)    GEN: Well nourished, well developed in no acute distress HEENT: Normal, moist mucous membranes NECK: No JVD CARDIAC: regular rhythm, normal S1 and S2, no rubs or gallops. No murmur. VASCULAR: Radial and DP pulses 2+ bilaterally. No carotid bruits RESPIRATORY:  Clear to auscultation without rales, wheezing or rhonchi  ABDOMEN:  Soft, non-tender, non-distended MUSCULOSKELETAL:  Ambulates independently SKIN: Warm and dry, no significant edema NEUROLOGIC:  Alert and oriented x 3. No focal neuro deficits noted. PSYCHIATRIC:  Normal affect    ASSESSMENT:    1. Essential hypertension   2. Coronary artery disease of native artery of native heart with stable angina pectoris ([place])   3. S/P CABG x 5   4. Pure hypercholesterolemia   5. Aortic dilatation (HCC)   6. Family history of heart disease   7. Type II diabetes circulatory disorder causing erectile dysfunction (HCC)   8. Myalgia due to statin     PLAN:    Dyspnea on exertion, Throat pain on exertion-->angina Coronary CT consistent with CTO of Lcx, obstructive CAD S/P 5V CABG 9/023 Hypercholesterolemia Statin myalgia -doing well, completed -continue aspirin, atorvastatin. Had myalgia on rosuvastatin in the past -LDL goal <55 with diabetes and extensive disease, if cannot get to goal would use PCSK9i. Recheck ordered today, discussed PCSK9i today.  Hypertension Aortic dilation -BP much improved, home numbers reviewed -continue telmisartan, carvedilol. Does better with splitting amlodipine  ED: has noted sildenafil has not worked as well since his surgery.  Recommended either trialing another PDE5i or consider seeing urology  Cardiac risk counseling and prevention recommendations: Family history of heart disease -recommend heart healthy/Mediterranean diet, with whole grains, fruits, vegetable, fish, lean meats, nuts, and olive oil. Limit salt. -recommend moderate walking, 3-5 times/week for 30-50 minutes each session. Aim for at least 150 minutes.week. Goal should be pace of 3 miles/hours, or walking 1.5 miles in 30 minutes -recommend avoidance of tobacco products. Avoid excess alcohol.  Plan for follow up: 6 months or sooner as needed.  Buford Dresser, MD, PhD, [facility]    Medication Adjustments/Labs and Tests Ordered: Current medicines are reviewed at length with the patient today.  Concerns regarding medicines are outlined above.   Orders Placed This Encounter  Procedures   Lipid panel   Meds ordered this encounter  Medications   DISCONTD: amLODipine (NORVASC) 5 MG tablet    Sig: Take 2 tablets (10 mg total) by mouth 2 (two) times daily.    Dispense:  180 tablet    Refill:  3    Please wait to fill until patient calls.   amLODipine (NORVASC) 5 MG tablet    Sig: Take 1 tablet (5 mg total) by mouth 2 (two) times daily.    Dispense:  180 tablet    Refill:  3    Please wait to fill until patient calls. 5 mg BID is correct dose   Patient Instructions  Medication Instructions:  Your physician recommends that you continue on your current medications as directed. Please refer to the Current Medication list given to you today.   *If you need a refill on your cardiac medications before your next appointment, please call your pharmacy*  Lab Work: [place]   If you have labs (blood work) drawn today and your tests are completely normal, you will receive your results only by: [place] (if you have MyChart) OR A paper copy in the mail If you have any lab test that is abnormal or we need  to change your treatment, we will call you to review the results.  Testing/Procedures: NONE  Follow-Up: At [facility], you and your health needs are our priority.  As part of our continuing mission to provide you with exceptional heart care, we have created designated Provider Care Teams.  These  Care Teams include your primary Cardiologist (physician) and Advanced Practice Providers (APPs -  Physician Assistants and Nurse Practitioners) who all work together to provide you with the care you need, when you need it.  We recommend signing up for the patient portal called "MyChart".  Sign up information is provided on this After Visit Summary.  MyChart is used to connect with patients for Virtual Visits (Telemedicine).  Patients are able to view lab/test results, encounter notes, upcoming appointments, etc.  Non-urgent messages can be sent to your provider as well.   To learn more about what you can do with MyChart, go to [email or web address].    Your next appointment:   6 month(s)  Provider:   Buford Dresser, MD      [facility] Stump[place],acting as a scribe for Buford Dresser, MD.,have documented all relevant documentation on the behalf of Buford Dresser, MD,as directed by  Buford Dresser, MD while in the presence of Buford Dresser, MD.  I, Buford Dresser, MD, have reviewed all documentation for this visit. The documentation on 06/19/22 for the exam, diagnosis, procedures, and orders are all accurate and complete.   Signed, Buford Dresser, MD PhD 06/19/2022     [place]

## 2022-06-19 NOTE — Patient Instructions (Signed)
Medication Instructions:  Your physician recommends that you continue on your current medications as directed. Please refer to the Current Medication list given to you today.   *If you need a refill on your cardiac medications before your next appointment, please call your pharmacy*  Lab Work: [place]   If you have labs (blood work) drawn today and your tests are completely normal, you will receive your results only by: [place] (if you have MyChart) OR A paper copy in the mail If you have any lab test that is abnormal or we need to change your treatment, we will call you to review the results.  Testing/Procedures: NONE  Follow-Up: At [facility], you and your health needs are our priority.  As part of our continuing mission to provide you with exceptional heart care, we have created designated Provider Care Teams.  These Care Teams include your primary Cardiologist (physician) and Advanced Practice Providers (APPs -  Physician Assistants and Nurse Practitioners) who all work together to provide you with the care you need, when you need it.  We recommend signing up for the patient portal called "MyChart".  Sign up information is provided on this After Visit Summary.  MyChart is used to connect with patients for Virtual Visits (Telemedicine).  Patients are able to view lab/test results, encounter notes, upcoming appointments, etc.  Non-urgent messages can be sent to your provider as well.   To learn more about what you can do with MyChart, go to [email or web address].    Your next appointment:   6 month(s)  Provider:   Buford Dresser, MD

## 2022-06-27 DIAGNOSIS — E7800 Pure hypercholesterolemia, unspecified: Secondary | ICD-10-CM | POA: Diagnosis not present

## 2022-06-27 DIAGNOSIS — I10 Essential (primary) hypertension: Secondary | ICD-10-CM | POA: Diagnosis not present

## 2022-06-28 LAB — LIPID PANEL
Chol/HDL Ratio: 3.2 ratio (ref 0.0–5.0)
Cholesterol, Total: 116 mg/dL (ref 100–199)
HDL: 36 mg/dL — ABNORMAL LOW (ref >39)
LDL Chol Calc (NIH): 62 mg/dL (ref 0–99)
Triglycerides: 94 mg/dL (ref 0–149)
VLDL Cholesterol Cal: 18 mg/dL (ref 5–40)

## 2022-07-08 ENCOUNTER — Telehealth (HOSPITAL_BASED_OUTPATIENT_CLINIC_OR_DEPARTMENT_OTHER): Payer: Self-pay

## 2022-07-08 MED ORDER — REPATHA SURECLICK 140 MG/ML ~~LOC~~ SOAJ: 140.0000 mg | SUBCUTANEOUS | 6 refills | Status: DC

## 2022-07-08 NOTE — Telephone Encounter (Addendum)
Called patient and provided the below recommendations, the patient is hesitant about starting new medications but is agreeable to seeing if we can get approved through his insurance. Informed patient I would send Rx to pharm, but just to leave it there until he hears back from [place].   Rx to pharmacy on file   Routed call to [place] Team for prior Oda Kilts, patient would like to know if he has any  other options and if so to please send the list to his mychart.    ----- Message from Loel Dubonnet, NP sent at 07/08/2022  9:06 AM EDT ----- LDL not yet at goal of less than 55.  Recommend initiation of Repatha as discussed in clinic visit with Dr. Harrell Gave.  Clinical staff-we can either start the prior auth process and have him come into the office for nurse visit for initial injection or if he wishes can see pharmacy lipid clinic for further discussion.

## 2022-07-09 ENCOUNTER — Telehealth: Payer: Self-pay

## 2022-07-09 ENCOUNTER — Other Ambulatory Visit (HOSPITAL_COMMUNITY): Payer: Self-pay

## 2022-07-09 NOTE — Telephone Encounter (Signed)
Pharmacy Patient Advocate Encounter   Received notification from [facility] that prior authorization for REPATHA 140 MG/ML INJ is needed.    PA submitted on 07/09/22 Key [id number] Status is pending  Karie Soda, [place] Patient Advocate Specialist Direct Number: [phone] Fax: [phone]

## 2022-07-09 NOTE — Telephone Encounter (Signed)
Pharmacy Patient Advocate Encounter  Prior Authorization for REPATHA has been approved.    Effective dates: 07/09/22 through 07/09/23  Karie Soda, [place] Patient Advocate Specialist Direct Number: [phone] Fax: [phone]

## 2022-08-26 DIAGNOSIS — M67912 Unspecified disorder of synovium and tendon, left shoulder: Secondary | ICD-10-CM | POA: Diagnosis not present

## 2022-09-25 DIAGNOSIS — M67912 Unspecified disorder of synovium and tendon, left shoulder: Secondary | ICD-10-CM | POA: Diagnosis not present

## 2022-10-09 DIAGNOSIS — M67912 Unspecified disorder of synovium and tendon, left shoulder: Secondary | ICD-10-CM | POA: Diagnosis not present

## 2022-10-21 DIAGNOSIS — M25512 Pain in left shoulder: Secondary | ICD-10-CM | POA: Diagnosis not present

## 2022-10-21 DIAGNOSIS — E1169 Type 2 diabetes mellitus with other specified complication: Secondary | ICD-10-CM | POA: Diagnosis not present

## 2022-10-21 DIAGNOSIS — E782 Mixed hyperlipidemia: Secondary | ICD-10-CM | POA: Diagnosis not present

## 2022-10-21 DIAGNOSIS — E1165 Type 2 diabetes mellitus with hyperglycemia: Secondary | ICD-10-CM | POA: Diagnosis not present

## 2022-10-21 DIAGNOSIS — I10 Essential (primary) hypertension: Secondary | ICD-10-CM | POA: Diagnosis not present

## 2022-10-21 LAB — LAB REPORT - SCANNED
A1c: 5.9
EGFR: 88

## 2022-11-13 DIAGNOSIS — M25512 Pain in left shoulder: Secondary | ICD-10-CM | POA: Diagnosis not present

## 2022-11-13 DIAGNOSIS — M24812 Other specific joint derangements of left shoulder, not elsewhere classified: Secondary | ICD-10-CM | POA: Diagnosis not present

## 2022-11-13 DIAGNOSIS — M19012 Primary osteoarthritis, left shoulder: Secondary | ICD-10-CM | POA: Diagnosis not present

## 2022-11-16 ENCOUNTER — Other Ambulatory Visit: Payer: Self-pay | Admitting: Family

## 2022-12-24 ENCOUNTER — Ambulatory Visit (INDEPENDENT_AMBULATORY_CARE_PROVIDER_SITE_OTHER): Payer: BC Managed Care – PPO | Admitting: Cardiology

## 2022-12-24 ENCOUNTER — Encounter (HOSPITAL_BASED_OUTPATIENT_CLINIC_OR_DEPARTMENT_OTHER): Payer: Self-pay | Admitting: Cardiology

## 2022-12-24 VITALS — BP 126/72 | HR 62 | Ht 70.0 in | Wt 207.2 lb

## 2022-12-24 DIAGNOSIS — Z951 Presence of aortocoronary bypass graft: Secondary | ICD-10-CM

## 2022-12-24 DIAGNOSIS — T466X5A Adverse effect of antihyperlipidemic and antiarteriosclerotic drugs, initial encounter: Secondary | ICD-10-CM

## 2022-12-24 DIAGNOSIS — E7800 Pure hypercholesterolemia, unspecified: Secondary | ICD-10-CM | POA: Diagnosis not present

## 2022-12-24 DIAGNOSIS — I2510 Atherosclerotic heart disease of native coronary artery without angina pectoris: Secondary | ICD-10-CM

## 2022-12-24 DIAGNOSIS — T466X5D Adverse effect of antihyperlipidemic and antiarteriosclerotic drugs, subsequent encounter: Secondary | ICD-10-CM

## 2022-12-24 DIAGNOSIS — I10 Essential (primary) hypertension: Secondary | ICD-10-CM | POA: Diagnosis not present

## 2022-12-24 DIAGNOSIS — M791 Myalgia, unspecified site: Secondary | ICD-10-CM

## 2022-12-24 DIAGNOSIS — I77819 Aortic ectasia, unspecified site: Secondary | ICD-10-CM

## 2022-12-24 NOTE — Progress Notes (Signed)
Cardiology Office Note:  .    Date:  12/24/2022  ID:  Jonathan Riley, DOB [birth year], MRN [id number] PCP: Blair Heys, MD  [facility] Providers Cardiologist:  Jodelle Red, MD     History of Present Illness: .    Jonathan Riley is a 62 y.o. male with a hx of CAD s/p CABG x5 12/2021, hypertension, type II diabetes, hyperlipidemia who is seen for follow-up. He was initially seen 11/14/2021 as a new consult at the request of Blair Heys, MD for the evaluation and management of dyspnea on exertion.   Family history: father had MI age 7. Maternal grandfather had several MI. Paternal uncle died of MI as well. Maternal grandmother had a stroke. Maternal aunt had a stroke.Brother was a Arts development officer, has Parkinson's, HTN, DM.   CV history: seen for DOE 10/2021. Coronary CT was ordered, which showed a coronary calcium score of 1633 and moderate 3V CAD with CTO of distal Lcx. on 12/03/2021. Heart catheterization 12/20/2021 which revealed left main trunk 25 to 30% stenosis and possibly catheter induced dissection/trauma at the time of first injection. Proximal to mid segmental 60 to 70% narrowing. Severely diseased circumflex with 70% first obtuse marginal 80% segmental second marginal and total occlusion of the distal circumflex. High-grade mid RCA stenosis, greater than 95% with TIMI-3 flow.  Proximal RCA and PDA 60 to 70% stenoses. Severe elevation in LVEDP, 26 mmHg.  Systolic function was normal. He underwent 5V CABG on 12/24/21.   On 05/22/2022, he was feeling well and exercising routinely. Blood pressures had averaged 140s/60-80s while at cardiac rehab, once in the 90s/50s. In the office his BP was uncontrolled up to 170 systolic. Metoprolol was switched to Carvedilol. He also noted sildenafil had not worked as well since his surgery. Recommended either trialing another PDE5i or consider seeing urology.   At his visit 06/19/2022, he noted that he had dizziness on 10 mg amlodipine so it had  been decreased to 5 mg. He then started taking 5 mg twice daily when he started the carvedilol. Since that change he denied recurrent dizziness and home blood pressures were stable. As of 06/2022 his LDL was not yet at goal of less than 55, so he was started on Repatha.   Today, he is generally feeling well. He does complain of occasional lightheadedness after bending over, or when standing up after completing a set of lifting weights. Lately he has been exercising at the gym 4-5 days per week. He states that his breathing is more stable while exercising.   Generally he is tolerating his medications, although he has noticed that his appetite seems to have increased on Repatha. He reports easy bleeding with minor abrasions/lacerations, takes a little while to stop bleeding.  He denies any palpitations, chest pain, shortness of breath, peripheral edema, headaches, syncope, orthopnea, or PND.  ROS:  Please see the history of present illness. ROS otherwise negative except as noted.  (+) Lightheadedness (+) Easy bleeding  Studies Reviewed: Marland Kitchen    EKG Interpretation Date/Time:  Wednesday December 24 2022 08:10:35 EDT Ventricular Rate:  62 PR Interval:  152 QRS Duration:  86 QT Interval:  394 QTC Calculation: 399 R Axis:   90  Text Interpretation: Normal sinus rhythm Nonspecific ST abnormality Confirmed by Jodelle Red [phone]) on 12/24/2022 8:12:44 AM    Physical Exam:    VS:  BP 126/72   Pulse 62   Ht 5\' 10"  (1.778 m)   Wt 207 lb 3.2 oz (94  kg)   SpO2 99%   BMI 29.73 kg/m    Wt Readings from Last 3 Encounters:  12/24/22 207 lb 3.2 oz (94 kg)  06/19/22 223 lb (101.2 kg)  05/22/22 246 lb (111.6 kg)    GEN: Well nourished, well developed in no acute distress HEENT: Normal, moist mucous membranes NECK: No JVD CARDIAC: regular rhythm, normal S1 and S2, no rubs or gallops. No murmur. VASCULAR: Radial and DP pulses 2+ bilaterally. No carotid bruits RESPIRATORY:  Clear to  auscultation without rales, wheezing or rhonchi  ABDOMEN: Soft, non-tender, non-distended MUSCULOSKELETAL:  Ambulates independently SKIN: Warm and dry, no edema NEUROLOGIC:  Alert and oriented x 3. No focal neuro deficits noted. PSYCHIATRIC:  Normal affect   ASSESSMENT AND PLAN: .    Dyspnea on exertion, Throat pain on exertion-->angina Coronary CT consistent with CTO of Lcx, obstructive CAD S/P 5V CABG 9/023 Hypercholesterolemia Statin myalgia -doing well, now 1 year out from CABG -continue aspirin, atorvastatin. Had myalgia on rosuvastatin in the past -LDL goal <55 with diabetes and extensive disease, now on PCSK9i -labs from [facility] 10/21/22 Tchol 69, HDL 44, LDL 10, TG 63   Hypertension Aortic dilation -BP much improved, home numbers reviewed. If continues to improve, may be able to cut back on meds at follow up -continue telmisartan, carvedilol. Does better with splitting amlodipine   Cardiac risk counseling and prevention recommendations: Family history of heart disease -recommend heart healthy/Mediterranean diet, with whole grains, fruits, vegetable, fish, lean meats, nuts, and olive oil. Limit salt. -recommend moderate walking, 3-5 times/week for 30-50 minutes each session. Aim for at least 150 minutes.week. Goal should be pace of 3 miles/hours, or walking 1.5 miles in 30 minutes -recommend avoidance of tobacco products. Avoid excess alcohol.  Dispo: Follow-up in 6 months, or sooner as needed.  I,Mathew Stumpf,acting as a Neurosurgeon for [employer], MD.,have documented all relevant documentation on the behalf of Jodelle Red, MD,as directed by  Jodelle Red, MD while in the presence of Jodelle Red, MD.  I, Jodelle Red, MD, have reviewed all documentation for this visit. The documentation on 12/24/22 for the exam, diagnosis, procedures, and orders are all accurate and complete.   Signed, Jodelle Red, MD

## 2022-12-24 NOTE — Patient Instructions (Signed)
Medication Instructions:  The current medical regimen is effective;  continue present plan and medications.   *If you need a refill on your cardiac medications before your next appointment, please call your pharmacy*   Lab Work: None  Testing/Procedures: None   Follow-Up: At [facility], you and your health needs are our priority.  As part of our continuing mission to provide you with exceptional heart care, we have created designated Provider Care Teams.  These Care Teams include your primary Cardiologist (physician) and Advanced Practice Providers (APPs -  Physician Assistants and Nurse Practitioners) who all work together to provide you with the care you need, when you need it.  We recommend signing up for the patient portal called "MyChart".  Sign up information is provided on this After Visit Summary.  MyChart is used to connect with patients for Virtual Visits (Telemedicine).  Patients are able to view lab/test results, encounter notes, upcoming appointments, etc.  Non-urgent messages can be sent to your provider as well.   To learn more about what you can do with MyChart, go to [email or web address].com.    Your next appointment:   6 month(s)  Provider:   Bridgette Christopher, MD    Other Instructions None  

## 2023-03-24 ENCOUNTER — Ambulatory Visit: Payer: BC Managed Care – PPO | Admitting: Behavioral Health

## 2023-04-06 DIAGNOSIS — E119 Type 2 diabetes mellitus without complications: Secondary | ICD-10-CM | POA: Diagnosis not present

## 2023-04-08 DIAGNOSIS — E118 Type 2 diabetes mellitus with unspecified complications: Secondary | ICD-10-CM | POA: Diagnosis not present

## 2023-04-08 DIAGNOSIS — E782 Mixed hyperlipidemia: Secondary | ICD-10-CM | POA: Diagnosis not present

## 2023-04-08 DIAGNOSIS — I10 Essential (primary) hypertension: Secondary | ICD-10-CM | POA: Diagnosis not present

## 2023-04-08 DIAGNOSIS — Z23 Encounter for immunization: Secondary | ICD-10-CM | POA: Diagnosis not present

## 2023-04-08 DIAGNOSIS — Z0000 Encounter for general adult medical examination without abnormal findings: Secondary | ICD-10-CM | POA: Diagnosis not present

## 2023-04-08 DIAGNOSIS — I2510 Atherosclerotic heart disease of native coronary artery without angina pectoris: Secondary | ICD-10-CM | POA: Diagnosis not present

## 2023-04-30 ENCOUNTER — Telehealth: Payer: Self-pay

## 2023-04-30 NOTE — Telephone Encounter (Signed)
   Name: Jonathan Riley  DOB: August 08, 1960  MRN: [id number]  Primary Cardiologist: Shelda Bruckner, MD   Preoperative team, please contact this patient and set up a phone call appointment for further preoperative risk assessment. Please obtain consent and complete medication review. Thank you for your help.  I confirm that guidance regarding antiplatelet and oral anticoagulation therapy has been completed and, if necessary, noted below.  None requested.  I also confirmed the patient resides in the state of [place] . As per [employer] telemedicine laws, the patient must reside in the state in which the provider is licensed.   Barnie Hila, NP 04/30/2023, 3:48 PM [facility]

## 2023-04-30 NOTE — Telephone Encounter (Signed)
  Patient Consent for Virtual Visit         Jonathan Riley has provided verbal consent on 04/30/2023 for a virtual visit (video or telephone).   CONSENT FOR VIRTUAL VISIT FOR:  Jonathan Riley  By participating in this virtual visit I agree to the following:  I hereby voluntarily request, consent and authorize [facility] and its employed or contracted physicians, physician assistants, nurse practitioners or other licensed health care professionals (the Practitioner), to provide me with telemedicine health care services (the "Services) as deemed necessary by the treating Practitioner. I acknowledge and consent to receive the Services by the Practitioner via telemedicine. I understand that the telemedicine visit will involve communicating with the Practitioner through live audiovisual communication technology and the disclosure of certain medical information by electronic transmission. I acknowledge that I have been given the opportunity to request an in-person assessment or other available alternative prior to the telemedicine visit and am voluntarily participating in the telemedicine visit.  I understand that I have the right to withhold or withdraw my consent to the use of telemedicine in the course of my care at any time, without affecting my right to future care or treatment, and that the Practitioner or I may terminate the telemedicine visit at any time. I understand that I have the right to inspect all information obtained and/or recorded in the course of the telemedicine visit and may receive copies of available information for a reasonable fee.  I understand that some of the potential risks of receiving the Services via telemedicine include:  Delay or interruption in medical evaluation due to technological equipment failure or disruption; Information transmitted may not be sufficient (e.g. poor resolution of images) to allow for appropriate medical decision making by the  Practitioner; and/or  In rare instances, security protocols could fail, causing a breach of personal health information.  Furthermore, I acknowledge that it is my responsibility to provide information about my medical history, conditions and care that is complete and accurate to the best of my ability. I acknowledge that Practitioner's advice, recommendations, and/or decision may be based on factors not within their control, such as incomplete or inaccurate data provided by me or distortions of diagnostic images or specimens that may result from electronic transmissions. I understand that the practice of medicine is not an exact science and that Practitioner makes no warranties or guarantees regarding treatment outcomes. I acknowledge that a copy of this consent can be made available to me via my patient portal [facility] MyChart), or I can request a printed copy by calling the office of [facility].    I understand that my insurance will be billed for this visit.   I have read or had this consent read to me. I understand the contents of this consent, which adequately explains the benefits and risks of the Services being provided via telemedicine.  I have been provided ample opportunity to ask questions regarding this consent and the Services and have had my questions answered to my satisfaction. I give my informed consent for the services to be provided through the use of telemedicine in my medical care

## 2023-04-30 NOTE — Telephone Encounter (Signed)
   Pre-operative Risk Assessment    Patient Name: Jonathan Riley  DOB: Sep 02, 1960 MRN: [id number]   Date of last office visit: 12/24/22 Date of next office visit: 06/23/23  Request for Surgical Clearance    Procedure:  Colonoscopy   Date of Surgery:  Clearance TBD                                 Surgeon:  Dr. Jerrell Sol  Surgeon's Group or Practice Name:  [facility] Phone number:  [phone]  Fax number:  [phone]   Type of Clearance Requested:   - Medical    Type of Anesthesia:  Propofol    Additional requests/questions:    [name]   04/30/2023, 11:00 AM

## 2023-04-30 NOTE — Telephone Encounter (Signed)
 Pt states that his colonoscopy is scheduled for 06/14/23. Pt scheduled for tele visit on 06/01/23. Med rec and consent done

## 2023-05-16 ENCOUNTER — Other Ambulatory Visit: Payer: Self-pay | Admitting: Family

## 2023-06-01 ENCOUNTER — Ambulatory Visit: Payer: BC Managed Care – PPO | Attending: Nurse Practitioner | Admitting: Nurse Practitioner

## 2023-06-01 ENCOUNTER — Encounter: Payer: Self-pay | Admitting: Nurse Practitioner

## 2023-06-01 DIAGNOSIS — Z01810 Encounter for preprocedural cardiovascular examination: Secondary | ICD-10-CM | POA: Diagnosis not present

## 2023-06-01 NOTE — Progress Notes (Signed)
 Virtual Visit via Telephone Note   Because of Jonathan Riley's co-morbid illnesses, he is at least at moderate risk for complications without adequate follow up.  This format is felt to be most appropriate for this patient at this time.  The patient did not have access to video technology/had technical difficulties with video requiring transitioning to audio format only (telephone).  All issues noted in this document were discussed and addressed.  No physical exam could be performed with this format.  Please refer to the patient's chart for his consent to telehealth for [facility].  Evaluation Performed:  Preoperative cardiovascular risk assessment _____________   Date:  06/01/2023   Patient ID:  IBIN CIPRES, DOB [birth year], MRN [id number] Patient Location:  Home Provider location:   Office  Primary Care Provider:  Jannelle Memory, MD (Inactive) Primary Cardiologist:  Sheryle Donning, MD  Chief Complaint / Patient Profile   63 y.o. y/o male with a h/o CAD s/p CABG x 5 12/2021, HTN, type 2 DM, hyperlipidemia, aortic dilatation who is pending colonoscopy with Dr. Honey Lusty date TBD and presents today for telephonic preoperative cardiovascular risk assessment.  History of Present Illness    Jonathan Riley is a 63 y.o. male who presents via audio/video conferencing for a telehealth visit today.  Pt was last seen in cardiology clinic on 12/24/22 by Dr. Veryl Gottron.  At that time Jonathan Riley was doing well.  The patient is now pending procedure as outlined above. Since his last visit, he denies chest pain, shortness of breath, lower extremity edema, fatigue, palpitations, melena, hematuria, hemoptysis, diaphoresis, weakness, presyncope, syncope, orthopnea, and PND. He remains very active with regular exercise and a physically demanding job. He does not have any concerning cardiac symptoms.    Past Medical History    Past Medical History:  Diagnosis Date   Arthritis     Depression    Diabetes mellitus without complication (HCC)    Dyspnea    "with exertion"   Hypertension    Nocturia    1-2 times during night   PONV (postoperative nausea and vomiting)    "nausea after a knee surgery"   Past Surgical History:  Procedure Laterality Date   COLONOSCOPY     CORONARY ARTERY BYPASS GRAFT N/A 12/24/2021   Procedure: CORONARY ARTERY BYPASS GRAFTING (CABG) X 5 BYPASSES USING OPEN LEFT INTERNAL MAMMARY ARTERY, OPEN LEFT RADIAL ARTERY, AND OPEN RIGHT GREATER SAPHENOUS VEIN HARVEST.;  Surgeon: Hilarie Lovely, MD;  Location: [facility] OR;  Service: Open Heart Surgery;  Laterality: N/A;   KNEE SURGERY Bilateral    Right, Left x2   LEFT HEART CATH AND CORONARY ANGIOGRAPHY N/A 12/20/2021   Procedure: LEFT HEART CATH AND CORONARY ANGIOGRAPHY;  Surgeon: Arty Binning, MD;  Location: [facility] INVASIVE CV LAB;  Service: Cardiovascular;  Laterality: N/A;   TEE WITHOUT CARDIOVERSION N/A 12/24/2021   Procedure: TRANSESOPHAGEAL ECHOCARDIOGRAM (TEE);  Surgeon: Hilarie Lovely, MD;  Location: [facility] OR;  Service: Open Heart Surgery;  Laterality: N/A;   TOTAL HIP ARTHROPLASTY Right 03/03/2016   Procedure: TOTAL HIP ARTHROPLASTY ANTERIOR APPROACH;  Surgeon: Adonica Hoose, MD;  Location: [facility] OR;  Service: Orthopedics;  Laterality: Right;   WRIST SURGERY Right 2008 or 2009    Allergies  Allergies  Allergen Reactions   Percocet [Oxycodone-Acetaminophen ] Nausea And Vomiting   Crestor [Rosuvastatin]     Other reaction(s): myalgia/arthralgia (06/2020)    Home Medications    Prior to Admission medications   Medication Sig  Start Date End Date Taking? Authorizing Provider  acetaminophen  (TYLENOL ) 500 MG tablet Take 1,000 mg by mouth every 6 (six) hours as needed (pain.).    [provider]  amLODipine  (NORVASC ) 5 MG tablet Take 1 tablet (5 mg total) by mouth 2 (two) times daily. 06/19/22   Sheryle Donning, MD  aspirin  EC 325 MG tablet Take 1 tablet (325 mg total) by mouth  daily. 12/30/21   Gold, Wayne E, PA-C  atorvastatin  (LIPITOR ) 80 MG tablet Take 1 tablet (80 mg total) by mouth daily. 03/05/22   Sheryle Donning, MD  carvedilol  (COREG ) 6.25 MG tablet Take 1 tablet (6.25 mg total) by mouth 2 (two) times daily. 05/22/22   Sheryle Donning, MD  diphenhydrAMINE  (BENADRYL ) 25 mg capsule Take 25 mg by mouth at bedtime as needed for sleep.    [provider]  Evolocumab  (REPATHA  SURECLICK) 140 MG/ML SOAJ INJECT 140 MG INTO THE SKIN EVERY 14 (FOURTEEN) DAYS. 05/18/23   Walker, Caitlin S, NP  ibuprofen (ADVIL) 200 MG tablet Take 400 mg by mouth every 8 (eight) hours as needed (pain.).    [provider]  metFORMIN  (GLUCOPHAGE -XR) 500 MG 24 hr tablet Take 500 mg by mouth daily. 09/23/21   [provider]  sildenafil (REVATIO) 20 MG tablet Take 40-100 mg by mouth daily as needed (erectile dysfunction). 12/13/21   [provider]  Sodium Bicarbonate  (NICE PURE BAKING SODA) POWD Take 1 Dose by mouth 2 (two) times daily as needed (acid reflux/indigestion). Baking Soda Mixed with water if needed for reflux/indigestion.    [provider]  telmisartan (MICARDIS) 80 MG tablet Take 80 mg by mouth daily.    [provider]  tirzepatide Jonathan Riley) 5 MG/0.5ML Pen Inject 5 mg into the skin once a week.    [provider]    Physical Exam    Vital Signs:  Zubeyr Perillo Wiechman does not have vital signs available for review today.  Given telephonic nature of communication, physical exam is limited. AAOx3. NAD. Normal affect.  Speech and respirations are unlabored.  Accessory Clinical Findings    None  Assessment & Plan    1.  Preoperative Cardiovascular Risk Assessment: According to the Revised Cardiac Risk Index (RCRI), his Perioperative Risk of Major Cardiac Event is (%): 0.9. His Functional Capacity in METs is: 8.23 according to the Duke Activity Status Index (DASI). The patient is doing well from a cardiac  perspective. Therefore, based on ACC/AHA guidelines, the patient would be at acceptable risk for the planned procedure without further cardiovascular testing.   The patient was advised that if he develops new symptoms prior to surgery to contact our office to arrange for a follow-up visit, and he verbalized understanding.  No request to hold cardiac medications. Patient is currently taking aspirin  325 mg daily which appears to be a holdover from CABG 12/2021. I advised him to reduce asa to 81 mg daily. If necessary, he may hold aspirin  for 5-7 days prior to procedure and should resume as soon as hemodynamically stable.   A copy of this note will be routed to requesting surgeon.  Time:   Today, I have spent 10 minutes with the patient with telehealth technology discussing medical history, symptoms, and management plan.    Gerldine Koch, NP-C  06/01/2023, 2:14 PM [address] Office [phone] Fax [phone]

## 2023-06-22 NOTE — Progress Notes (Signed)
 Cardiology Office Note:  .    Date:  06/23/2023  ID:  Jonathan Riley, DOB 12-Mar-1961, MRN [id number] PCP: Debroah Loop, DO  [facility] Providers Cardiologist:  Jodelle Red, MD     History of Present Illness: .    Jonathan Riley is a 63 y.o. male with a hx of CAD s/p CABG x5 12/2021, hypertension, type II diabetes, hyperlipidemia who is seen for follow-up. He was initially seen 11/14/2021 as a new consult at the request of Blair Heys, MD for the evaluation and management of dyspnea on exertion.   Family history: father had MI age 38. Maternal grandfather had several MI. Paternal uncle died of MI as well. Maternal grandmother had a stroke. Maternal aunt had a stroke.Brother was a [employer] development officer, has Parkinson's, HTN, DM.   CV history: seen for DOE 10/2021. Coronary CT was ordered, which showed a coronary calcium score of 1633 and moderate 3V CAD with CTO of distal Lcx. on 12/03/2021. Heart catheterization 12/20/2021 which revealed left main trunk 25 to 30% stenosis and possibly catheter induced dissection/trauma at the time of first injection. Proximal to mid segmental 60 to 70% narrowing. Severely diseased circumflex with 70% first obtuse marginal 80% segmental second marginal and total occlusion of the distal circumflex. High-grade mid RCA stenosis, greater than 95% with TIMI-3 flow.  Proximal RCA and PDA 60 to 70% stenoses. Severe elevation in LVEDP, 26 mmHg.  Systolic function was normal. He underwent 5V CABG on 12/24/21.  Today: Doing well overall. Going to the gym 2-3 times/week. Does 30 mins elliptical at good incline/pace , does some weights.   No chest pain. Has rare indigestion, just had some this weekend but it was clearly related to food, improved with baking soda and water.  Occasional has lightheadedness with standing quickly or changing position, especially after exercise/lifitng weights. Non-limiting, but we discussed that if this becomes more frequent/bothersome he  will let me know.  Having issues with erectile dysfunction, mostly obtaining and maintaining an erection. Has been on PDE5i inhibitors with little success. Recommended he talk with his PCP about a urology referral.  Denies chest pain, shortness of breath at rest or with normal exertion. No PND, orthopnea, LE edema or unexpected weight gain. No syncope or palpitations. ROS otherwise negative except as noted.    ROS:  Please see the history of present illness. ROS otherwise negative except as noted.    Studies Reviewed: Marland Kitchen         Physical Exam:    VS:  BP 112/62   Pulse 62   Ht 5\' 10"  (1.778 m)   Wt 212 lb (96.2 kg)   SpO2 95%   BMI 30.42 kg/m    Wt Readings from Last 3 Encounters:  06/23/23 212 lb (96.2 kg)  12/24/22 207 lb 3.2 oz (94 kg)  06/19/22 223 lb (101.2 kg)    GEN: Well nourished, well developed in no acute distress HEENT: Normal, moist mucous membranes NECK: No JVD CARDIAC: regular rhythm, normal S1 and S2, no rubs or gallops. No murmur. VASCULAR: Radial and DP pulses 2+ bilaterally. No carotid bruits RESPIRATORY:  Clear to auscultation without rales, wheezing or rhonchi  ABDOMEN: Soft, non-tender, non-distended MUSCULOSKELETAL:  Ambulates independently SKIN: Warm and dry, no edema NEUROLOGIC:  Alert and oriented x 3. No focal neuro deficits noted. PSYCHIATRIC:  Normal affect   ASSESSMENT AND PLAN: .    Dyspnea on exertion, Throat pain on exertion-->angina Coronary CT consistent with CTO of Lcx, obstructive CAD  S/P 5V CABG 12/2021 Hypercholesterolemia Statin myalgia -doing well, now >1 year out from CABG -continue aspirin, atorvastatin. Had myalgia on rosuvastatin in the past -LDL goal <55 with diabetes and extensive disease, now on PCSK9i -[facility] 04/08/23 Tchol 66, HDL 39, LDL 8, TG 99 -lp(a) normal <8.4   Type II diabetes:  -A1c 5.7 -on mounjaro and metformin   Hypertension Aortic dilation -BP much improved, if lightheadedness worsens would  cut amlodipine to once a day -continue telmisartan, carvedilol. Does better with splitting amlodipine  Erectile dysfunction: minimal help with PDE5i: recommend he discuss with his PCP and consider a urology referral.   Cardiac risk counseling and prevention recommendations: Family history of heart disease -recommend heart healthy/Mediterranean diet, with whole grains, fruits, vegetable, fish, lean meats, nuts, and olive oil. Limit salt. -recommend moderate walking, 3-5 times/week for 30-50 minutes each session. Aim for at least 150 minutes.week. Goal should be pace of 3 miles/hours, or walking 1.5 miles in 30 minutes -recommend avoidance of tobacco products. Avoid excess alcohol.  Dispo: Follow-up in 6 months, or sooner as needed.  Signed, Jodelle Red, MD

## 2023-06-23 ENCOUNTER — Ambulatory Visit (HOSPITAL_BASED_OUTPATIENT_CLINIC_OR_DEPARTMENT_OTHER): Payer: BC Managed Care – PPO | Admitting: Cardiology

## 2023-06-23 ENCOUNTER — Encounter (HOSPITAL_BASED_OUTPATIENT_CLINIC_OR_DEPARTMENT_OTHER): Payer: Self-pay | Admitting: Cardiology

## 2023-06-23 VITALS — BP 112/62 | HR 62 | Ht 70.0 in | Wt 212.0 lb

## 2023-06-23 DIAGNOSIS — Z951 Presence of aortocoronary bypass graft: Secondary | ICD-10-CM | POA: Diagnosis not present

## 2023-06-23 DIAGNOSIS — T466X5A Adverse effect of antihyperlipidemic and antiarteriosclerotic drugs, initial encounter: Secondary | ICD-10-CM

## 2023-06-23 DIAGNOSIS — I2510 Atherosclerotic heart disease of native coronary artery without angina pectoris: Secondary | ICD-10-CM | POA: Diagnosis not present

## 2023-06-23 DIAGNOSIS — I10 Essential (primary) hypertension: Secondary | ICD-10-CM

## 2023-06-23 DIAGNOSIS — M791 Myalgia, unspecified site: Secondary | ICD-10-CM

## 2023-06-23 DIAGNOSIS — I77819 Aortic ectasia, unspecified site: Secondary | ICD-10-CM

## 2023-06-23 DIAGNOSIS — T466X5D Adverse effect of antihyperlipidemic and antiarteriosclerotic drugs, subsequent encounter: Secondary | ICD-10-CM

## 2023-06-23 DIAGNOSIS — E7800 Pure hypercholesterolemia, unspecified: Secondary | ICD-10-CM | POA: Diagnosis not present

## 2023-06-23 DIAGNOSIS — N521 Erectile dysfunction due to diseases classified elsewhere: Secondary | ICD-10-CM

## 2023-06-23 DIAGNOSIS — E1159 Type 2 diabetes mellitus with other circulatory complications: Secondary | ICD-10-CM

## 2023-06-23 DIAGNOSIS — Z789 Other specified health status: Secondary | ICD-10-CM

## 2023-06-23 NOTE — Patient Instructions (Signed)
 Medication Instructions:  Your physician recommends that you continue on your current medications as directed. Please refer to the Current Medication list given to you today.  *If you need a refill on your cardiac medications before your next appointment, please call your pharmacy*  Follow-Up: At [facility], you and your health needs are our priority.  As part of our continuing mission to provide you with exceptional heart care, we have created designated Provider Care Teams.  These Care Teams include your primary Cardiologist (physician) and Advanced Practice Providers (APPs -  Physician Assistants and Nurse Practitioners) who all work together to provide you with the care you need, when you need it.  We recommend signing up for the patient portal called "MyChart".  Sign up information is provided on this After Visit Summary.  MyChart is used to connect with patients for Virtual Visits (Telemedicine).  Patients are able to view lab/test results, encounter notes, upcoming appointments, etc.  Non-urgent messages can be sent to your provider as well.   To learn more about what you can do with MyChart, go to [email or web address].    Your next appointment:   6 month(s)  Provider:   Jodelle Red, MD

## 2023-09-04 ENCOUNTER — Ambulatory Visit: Admitting: Urology

## 2024-01-05 NOTE — Progress Notes (Signed)
 Cardiology Office Note:  .    Date:  01/06/2024  ID:  Jonathan Riley, DOB 1960/06/20, MRN [id number] PCP: Jonathan Opal, DO  [facility] Providers Cardiologist:  Jonathan Bruckner, MD     History of Present Illness: .    Jonathan Riley is a 63 y.o. male with a hx of CAD s/p CABG x5 12/2021, hypertension, type II diabetes, hyperlipidemia who is seen for follow-up. He was initially seen 11/14/2021 as a new consult at the request of Jonathan Charleston, MD for the evaluation and management of dyspnea on exertion.   Family history: father had MI age 52. Maternal grandfather had several MI. Paternal uncle died of MI as well. Maternal grandmother had a stroke. Maternal aunt had a stroke.Brother was a [employer] development officer, has Parkinson's, HTN, DM.   CV history: seen for DOE 10/2021. Coronary CT was ordered, which showed a coronary calcium  score of 1633 and moderate 3V CAD with CTO of distal Lcx. on 12/03/2021. Heart catheterization 12/20/2021 which revealed left main trunk 25 to 30% stenosis and possibly catheter induced dissection/trauma at the time of first injection. Proximal to mid segmental 60 to 70% narrowing. Severely diseased circumflex with 70% first obtuse marginal 80% segmental second marginal and total occlusion of the distal circumflex. High-grade mid RCA stenosis, greater than 95% with TIMI-3 flow.  Proximal RCA and PDA 60 to 70% stenoses. Severe elevation in LVEDP, 26 mmHg.  Systolic function was normal. He underwent 5V CABG on 12/24/21.  Today: Has noticed a fluttering in chest a few times in the last few months. Happening about once/month. No clear pattern/trigger. When it happens he feels like he has to force himself to take a deep breath. Feels lightheaded. Flutters last about a second. No syncope.  Has been working a lot, goes to the gym regularly (elliptical with high resistance for 30 minutes, weights, etc).    ROS:  Denies chest pain, shortness of breath at rest or with normal exertion.  No PND, orthopnea, LE edema or unexpected weight gain. No syncope. ROS otherwise negative except as noted.    Studies Reviewed: SABRA    EKG Interpretation Date/Time:  Wednesday January 06 2024 09:11:12 EDT Ventricular Rate:  65 PR Interval:  192 QRS Duration:  90 QT Interval:  384 QTC Calculation: 399 R Axis:   -55  Text Interpretation: Normal sinus rhythm Left axis deviation Inferior infarct , age undetermined Confirmed by Riley Jonathan [phone]) on 01/06/2024 9:32:37 AM    Physical Exam:    VS:  BP 118/72   Pulse 67   Resp 17   Ht 5' 10 (1.778 m)   Wt 202 lb (91.6 kg)   SpO2 97%   BMI 28.98 kg/m    Wt Readings from Last 3 Encounters:  01/06/24 202 lb (91.6 kg)  06/23/23 212 lb (96.2 kg)  12/24/22 207 lb 3.2 oz (94 kg)    GEN: Well nourished, well developed in no acute distress HEENT: Normal, moist mucous membranes NECK: No JVD CARDIAC: regular rhythm, normal S1 and S2, no rubs or gallops. No murmur. VASCULAR: Radial and DP pulses 2+ bilaterally. No carotid bruits RESPIRATORY:  Clear to auscultation without rales, wheezing or rhonchi  ABDOMEN: Soft, non-tender, non-distended MUSCULOSKELETAL:  Ambulates independently SKIN: Warm and dry, no edema NEUROLOGIC:  Alert and oriented x 3. No focal neuro deficits noted. PSYCHIATRIC:  Normal affect   ASSESSMENT AND PLAN: .    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 Had myalgia on rosuvastatin in the past. Given very low LDL and on repatha , will cut atorvastatin  to 40 mg from 80 mg dose. He just filled a 90 day script and will cut these in half -LDL goal <55 with  diabetes and extensive disease -labs from [facility] 10/19/23:  Tchol 60, HDL 36, LDL (-2--calculated), TG 150 -lp(a) normal <8.4   Type II diabetes:  -A1c 5.6 per kpn -on mounjaro  Hypertension Aortic dilation -continue telmisartan, carvedilol . Does better with splitting amlodipine  -aorta was 4.2-4.3 cm on CT and echo in 2023. Has been 2 years, will re-image with echo  Erectile dysfunction: on PDE5i   Cardiac risk counseling and prevention recommendations: Family history of heart disease -recommend heart healthy/Mediterranean diet, with whole grains, fruits, vegetable, fish, lean meats, nuts, and olive oil. Limit salt. -recommend moderate walking, 3-5 times/week for 30-50 minutes each session. Aim for at least 150 minutes.week. Goal should be pace of 3 miles/hours, or walking 1.5 miles in 30 minutes -recommend avoidance of tobacco products. Avoid excess alcohol.  Dispo: Follow-up in 6 months, or sooner as needed.  Signed, Jonathan Bruckner, MD

## 2024-01-06 ENCOUNTER — Ambulatory Visit (INDEPENDENT_AMBULATORY_CARE_PROVIDER_SITE_OTHER): Admitting: Cardiology

## 2024-01-06 ENCOUNTER — Encounter (HOSPITAL_BASED_OUTPATIENT_CLINIC_OR_DEPARTMENT_OTHER): Payer: Self-pay | Admitting: Cardiology

## 2024-01-06 VITALS — BP 118/72 | HR 67 | Resp 17 | Ht 70.0 in | Wt 202.0 lb

## 2024-01-06 DIAGNOSIS — M791 Myalgia, unspecified site: Secondary | ICD-10-CM

## 2024-01-06 DIAGNOSIS — Z951 Presence of aortocoronary bypass graft: Secondary | ICD-10-CM | POA: Diagnosis not present

## 2024-01-06 DIAGNOSIS — R002 Palpitations: Secondary | ICD-10-CM

## 2024-01-06 DIAGNOSIS — I2510 Atherosclerotic heart disease of native coronary artery without angina pectoris: Secondary | ICD-10-CM | POA: Diagnosis not present

## 2024-01-06 DIAGNOSIS — I77819 Aortic ectasia, unspecified site: Secondary | ICD-10-CM

## 2024-01-06 DIAGNOSIS — T466X5D Adverse effect of antihyperlipidemic and antiarteriosclerotic drugs, subsequent encounter: Secondary | ICD-10-CM

## 2024-01-06 DIAGNOSIS — E7800 Pure hypercholesterolemia, unspecified: Secondary | ICD-10-CM | POA: Diagnosis not present

## 2024-01-06 MED ORDER — ATORVASTATIN CALCIUM 40 MG PO TABS: 40.0000 mg | ORAL_TABLET | Freq: Every day | ORAL | 2 refills | Status: AC

## 2024-01-06 MED ORDER — ATORVASTATIN CALCIUM 40 MG PO TABS: 40.0000 mg | ORAL_TABLET | Freq: Every day | ORAL | Status: DC

## 2024-01-06 NOTE — Patient Instructions (Signed)
 Medication Instructions:   DECREASE YOUR ATORVASTATIN  TO 40 MG BY MOUTH DAILY  *If you need a refill on your cardiac medications before your next appointment, please call your pharmacy*   Testing/Procedures:  Your physician has requested that you have an echocardiogram. Echocardiography is a painless test that uses sound waves to create images of your heart. It provides your doctor with information about the size and shape of your heart and how well your heart's chambers and valves are working. This procedure takes approximately one hour. There are no restrictions for this procedure.  I WILL HAVE OUR ECHO SCHEDULER REACH OUT TO YOU BY PHONE TO ARRANGE THIS APPOINTMENT  Please do NOT wear cologne, perfume, aftershave, or lotions (deodorant is allowed). Please arrive 15 minutes prior to your appointment time.  Please note: We ask at that you not bring children with you during ultrasound (echo/ vascular) testing. Due to room size and safety concerns, children are not allowed in the ultrasound rooms during exams. Our front office staff cannot provide observation of children in our lobby area while testing is being conducted. An adult accompanying a patient to their appointment will only be allowed in the ultrasound room at the discretion of the ultrasound technician under special circumstances. We apologize for any inconvenience.   Follow-Up: At [facility], you and your health needs are our priority.  As part of our continuing mission to provide you with exceptional heart care, our providers are all part of one team.  This team includes your primary Cardiologist (physician) and Advanced Practice Providers or APPs (Physician Assistants and Nurse Practitioners) who all work together to provide you with the care you need, when you need it.  Your next appointment:   6 month(s)  Provider:   Shelda Bruckner, MD, Rosaline Bane, NP, or Reche Finder, NP

## 2024-02-15 ENCOUNTER — Ambulatory Visit (INDEPENDENT_AMBULATORY_CARE_PROVIDER_SITE_OTHER)

## 2024-02-15 DIAGNOSIS — I77819 Aortic ectasia, unspecified site: Secondary | ICD-10-CM | POA: Diagnosis not present

## 2024-02-15 LAB — ECHOCARDIOGRAM COMPLETE
AV Vena cont: 0.18 cm
Area-P 1/2: 3.91 cm2
P 1/2 time: 608 ms
S' Lateral: 2.65 cm

## 2024-03-09 ENCOUNTER — Ambulatory Visit (HOSPITAL_BASED_OUTPATIENT_CLINIC_OR_DEPARTMENT_OTHER): Payer: Self-pay | Admitting: Cardiology

## 2024-04-19 ENCOUNTER — Other Ambulatory Visit: Payer: Self-pay | Admitting: Family
